# Patient Record
Sex: Male | Born: 1950
Health system: Southern US, Community
[De-identification: ages and names within clinical notes are randomized; demographics above are authoritative.]

## PROBLEM LIST (undated history)

## (undated) DIAGNOSIS — T7840XA Allergy, unspecified, initial encounter: Secondary | ICD-10-CM

## (undated) DIAGNOSIS — E119 Type 2 diabetes mellitus without complications: Secondary | ICD-10-CM

## (undated) DIAGNOSIS — N400 Enlarged prostate without lower urinary tract symptoms: Secondary | ICD-10-CM

## (undated) DIAGNOSIS — I2510 Atherosclerotic heart disease of native coronary artery without angina pectoris: Secondary | ICD-10-CM

## (undated) DIAGNOSIS — G4700 Insomnia, unspecified: Secondary | ICD-10-CM

## (undated) DIAGNOSIS — K635 Polyp of colon: Secondary | ICD-10-CM

## (undated) DIAGNOSIS — K219 Gastro-esophageal reflux disease without esophagitis: Secondary | ICD-10-CM

## (undated) DIAGNOSIS — M4800 Spinal stenosis, site unspecified: Secondary | ICD-10-CM

## (undated) DIAGNOSIS — E785 Hyperlipidemia, unspecified: Secondary | ICD-10-CM

## (undated) DIAGNOSIS — I10 Essential (primary) hypertension: Secondary | ICD-10-CM

## (undated) DIAGNOSIS — Z970 Presence of artificial eye: Secondary | ICD-10-CM

## (undated) HISTORY — DX: Type 2 diabetes mellitus without complications: E11.9

## (undated) HISTORY — DX: Insomnia, unspecified: G47.00

## (undated) HISTORY — DX: Hyperlipidemia, unspecified: E78.5

## (undated) HISTORY — DX: Polyp of colon: K63.5

## (undated) HISTORY — DX: Benign prostatic hyperplasia without lower urinary tract symptoms: N40.0

## (undated) HISTORY — DX: Gastro-esophageal reflux disease without esophagitis: K21.9

## (undated) HISTORY — DX: Atherosclerotic heart disease of native coronary artery without angina pectoris: I25.10

## (undated) HISTORY — DX: Essential (primary) hypertension: I10

## (undated) HISTORY — PX: OTHER SURGICAL HISTORY: SHX169

## (undated) HISTORY — DX: Allergy, unspecified, initial encounter: T78.40XA

## (undated) HISTORY — DX: Presence of artificial eye: Z97.0

## (undated) HISTORY — DX: Spinal stenosis, site unspecified: M48.00

## (undated) HISTORY — PX: JOINT REPLACEMENT: SHX530

---

## 1996-09-14 HISTORY — PX: CORONARY ARTERY BYPASS GRAFT: SHX141

## 2004-09-14 HISTORY — PX: CORONARY STENT PLACEMENT: SHX1402

## 2005-06-19 ENCOUNTER — Ambulatory Visit: Payer: Self-pay | Admitting: Cardiology

## 2005-06-19 ENCOUNTER — Other Ambulatory Visit: Payer: Self-pay

## 2005-06-20 ENCOUNTER — Other Ambulatory Visit: Payer: Self-pay

## 2005-07-14 ENCOUNTER — Encounter: Payer: Self-pay | Admitting: Cardiology

## 2008-01-20 ENCOUNTER — Ambulatory Visit: Payer: Self-pay | Admitting: General Surgery

## 2008-02-01 ENCOUNTER — Ambulatory Visit: Payer: Self-pay | Admitting: General Surgery

## 2010-06-26 ENCOUNTER — Ambulatory Visit: Payer: Self-pay | Admitting: Cardiology

## 2012-04-12 DIAGNOSIS — K635 Polyp of colon: Secondary | ICD-10-CM

## 2012-04-12 DIAGNOSIS — K219 Gastro-esophageal reflux disease without esophagitis: Secondary | ICD-10-CM

## 2012-04-12 DIAGNOSIS — I2510 Atherosclerotic heart disease of native coronary artery without angina pectoris: Secondary | ICD-10-CM | POA: Insufficient documentation

## 2012-04-12 DIAGNOSIS — M4800 Spinal stenosis, site unspecified: Secondary | ICD-10-CM

## 2012-04-12 DIAGNOSIS — Z970 Presence of artificial eye: Secondary | ICD-10-CM | POA: Insufficient documentation

## 2012-04-12 HISTORY — DX: Gastro-esophageal reflux disease without esophagitis: K21.9

## 2012-04-12 HISTORY — DX: Atherosclerotic heart disease of native coronary artery without angina pectoris: I25.10

## 2012-04-12 HISTORY — DX: Presence of artificial eye: Z97.0

## 2012-04-12 HISTORY — DX: Spinal stenosis, site unspecified: M48.00

## 2012-04-12 HISTORY — DX: Polyp of colon: K63.5

## 2012-11-26 ENCOUNTER — Encounter: Payer: Self-pay | Admitting: General Surgery

## 2013-02-07 ENCOUNTER — Ambulatory Visit: Payer: Self-pay | Admitting: General Surgery

## 2013-03-21 ENCOUNTER — Ambulatory Visit: Payer: Self-pay | Admitting: General Surgery

## 2013-04-27 ENCOUNTER — Encounter: Payer: Self-pay | Admitting: *Deleted

## 2014-03-05 DIAGNOSIS — G4700 Insomnia, unspecified: Secondary | ICD-10-CM | POA: Insufficient documentation

## 2014-03-05 DIAGNOSIS — E119 Type 2 diabetes mellitus without complications: Secondary | ICD-10-CM

## 2014-03-05 DIAGNOSIS — N400 Enlarged prostate without lower urinary tract symptoms: Secondary | ICD-10-CM | POA: Insufficient documentation

## 2014-03-05 HISTORY — DX: Insomnia, unspecified: G47.00

## 2014-03-05 HISTORY — DX: Type 2 diabetes mellitus without complications: E11.9

## 2014-03-05 HISTORY — DX: Benign prostatic hyperplasia without lower urinary tract symptoms: N40.0

## 2014-03-27 ENCOUNTER — Ambulatory Visit: Payer: Self-pay | Admitting: Urology

## 2014-04-09 ENCOUNTER — Ambulatory Visit: Payer: Self-pay | Admitting: Urology

## 2014-05-29 ENCOUNTER — Ambulatory Visit: Payer: Self-pay | Admitting: Specialist

## 2014-05-29 DIAGNOSIS — I25810 Atherosclerosis of coronary artery bypass graft(s) without angina pectoris: Secondary | ICD-10-CM

## 2014-05-29 LAB — BASIC METABOLIC PANEL
Anion Gap: 6 — ABNORMAL LOW (ref 7–16)
BUN: 13 mg/dL (ref 7–18)
Calcium, Total: 8.7 mg/dL (ref 8.5–10.1)
Chloride: 109 mmol/L — ABNORMAL HIGH (ref 98–107)
Co2: 24 mmol/L (ref 21–32)
Creatinine: 0.85 mg/dL (ref 0.60–1.30)
EGFR (African American): >60
EGFR (Non-African Amer.): >60
Glucose: 147 mg/dL — ABNORMAL HIGH (ref 65–99)
Osmolality: 280 (ref 275–301)
Potassium: 4.4 mmol/L (ref 3.5–5.1)
Sodium: 139 mmol/L (ref 136–145)

## 2014-05-29 LAB — CBC WITH DIFFERENTIAL/PLATELET
Basophil #: 0 10*3/uL (ref 0.0–0.1)
Basophil %: 0.5 %
Eosinophil #: 0.2 10*3/uL (ref 0.0–0.7)
Eosinophil %: 3.5 %
HCT: 41.4 % (ref 40.0–52.0)
HGB: 13.8 g/dL (ref 13.0–18.0)
Lymphocyte #: 1.4 10*3/uL (ref 1.0–3.6)
Lymphocyte %: 27.6 %
MCH: 32.1 pg (ref 26.0–34.0)
MCHC: 33.4 g/dL (ref 32.0–36.0)
MCV: 96 fL (ref 80–100)
Monocyte #: 0.4 x10 3/mm (ref 0.2–1.0)
Monocyte %: 7.1 %
Neutrophil #: 3.1 10*3/uL (ref 1.4–6.5)
Neutrophil %: 61.3 %
Platelet: 183 10*3/uL (ref 150–440)
RBC: 4.3 10*6/uL — ABNORMAL LOW (ref 4.40–5.90)
RDW: 13.8 % (ref 11.5–14.5)
WBC: 5.1 10*3/uL (ref 3.8–10.6)

## 2014-06-01 ENCOUNTER — Ambulatory Visit: Payer: Self-pay | Admitting: Specialist

## 2014-06-05 LAB — PATHOLOGY REPORT

## 2014-07-06 ENCOUNTER — Ambulatory Visit: Payer: Self-pay | Admitting: Urology

## 2014-07-08 ENCOUNTER — Emergency Department: Payer: Self-pay | Admitting: Emergency Medicine

## 2014-07-08 LAB — URINALYSIS, COMPLETE
Bacteria: NONE SEEN
Bilirubin,UR: NEGATIVE
Blood: NEGATIVE
Glucose,UR: 50 mg/dL (ref 0–75)
Leukocyte Esterase: NEGATIVE
Nitrite: NEGATIVE
Ph: 5 (ref 4.5–8.0)
Protein: NEGATIVE
RBC,UR: 1 /HPF (ref 0–5)
Specific Gravity: 1.019 (ref 1.003–1.030)
Squamous Epithelial: NONE SEEN
WBC UR: 1 /HPF (ref 0–5)

## 2014-08-03 ENCOUNTER — Ambulatory Visit: Payer: Self-pay | Admitting: Urology

## 2014-08-16 ENCOUNTER — Ambulatory Visit: Payer: Self-pay | Admitting: Urology

## 2014-08-16 LAB — BASIC METABOLIC PANEL
Anion Gap: 9 (ref 7–16)
BUN: 21 mg/dL — ABNORMAL HIGH (ref 7–18)
Calcium, Total: 8.3 mg/dL — ABNORMAL LOW (ref 8.5–10.1)
Chloride: 108 mmol/L — ABNORMAL HIGH (ref 98–107)
Co2: 24 mmol/L (ref 21–32)
Creatinine: 0.96 mg/dL (ref 0.60–1.30)
EGFR (African American): >60
EGFR (Non-African Amer.): >60
Glucose: 118 mg/dL — ABNORMAL HIGH (ref 65–99)
Osmolality: 285 (ref 275–301)
Potassium: 4.9 mmol/L (ref 3.5–5.1)
Sodium: 141 mmol/L (ref 136–145)

## 2014-08-16 LAB — CBC
HCT: 41.6 % (ref 40.0–52.0)
HGB: 13.9 g/dL (ref 13.0–18.0)
MCH: 31.7 pg (ref 26.0–34.0)
MCHC: 33.3 g/dL (ref 32.0–36.0)
MCV: 95 fL (ref 80–100)
Platelet: 166 10*3/uL (ref 150–440)
RBC: 4.38 10*6/uL — ABNORMAL LOW (ref 4.40–5.90)
RDW: 13.4 % (ref 11.5–14.5)
WBC: 4.3 10*3/uL (ref 3.8–10.6)

## 2014-08-16 LAB — PROTIME-INR
INR: 1
Prothrombin Time: 13.2 secs (ref 11.5–14.7)

## 2014-08-16 LAB — APTT: Activated PTT: 24.7 secs (ref 23.6–35.9)

## 2014-08-23 ENCOUNTER — Ambulatory Visit: Payer: Self-pay | Admitting: Urology

## 2014-09-20 ENCOUNTER — Ambulatory Visit: Payer: Self-pay | Admitting: Urology

## 2014-09-20 LAB — CREATININE, SERUM
Creatinine: 1.1 mg/dL (ref 0.60–1.30)
EGFR (African American): >60
EGFR (Non-African Amer.): >60

## 2014-10-18 ENCOUNTER — Ambulatory Visit: Payer: Self-pay

## 2014-11-16 ENCOUNTER — Ambulatory Visit: Payer: Self-pay

## 2015-01-05 NOTE — Op Note (Signed)
PATIENT NAME:  [name], [name] MR#:  [id number] DATE OF BIRTH:  1950/10/22  DATE OF PROCEDURE:  06/01/2014  PREOPERATIVE DIAGNOSIS: Chronic olecranon bursitis, left elbow.   POSTOPERATIVE DIAGNOSIS: Chronic olecranon bursitis, left elbow.   PROCEDURE: Excision of olecranon bursa, left elbow.   SURGEON: Christophe Louis, M.D.   ANESTHESIA: General.   COMPLICATIONS: None.   TOURNIQUET TIME: Approximately 50 minutes.   DESCRIPTION OF PROCEDURE: One gram of Ancef was given intravenously prior to the procedure. General anesthesia is induced. The left upper extremity is thoroughly prepped with alcohol and ChloraPrep and draped in standard sterile fashion. The extremity is wrapped out with the Esmarch bandage and a sterile tourniquet is elevated to 250 mmHg. Under loupe magnification, curved lateral incision is made at the left elbow olecranon bursa. The skin is undermined and the bursa sac is carefully dissected out. Special attention is paid to the position of the ulnar nerve medially. The sac is sent to pathology. The rongeur is then used under loupe magnification to carefully remove any remnants of bursa sac. The wound is thoroughly irrigated multiple times. Skin edges are infiltrated with 0.5% plain Marcaine. Two vessel loop drains are brought out through separate stab wound incisions. The skin is closed with the skin stapler. A soft bulky dressing is applied. The tourniquet is released. The patient is returned to the recovery room in satisfactory condition having tolerated the procedure quite well. ____________________________ Lucas Mallow, MD ces:sb D: 06/01/2014 10:29:55 ET T: 06/01/2014 10:57:46 ET JOB#: [id number]  cc: Lucas Mallow, MD, <Dictator> Lucas Mallow MD ELECTRONICALLY SIGNED 06/08/2014 10:04

## 2015-01-05 NOTE — Op Note (Signed)
PATIENT NAME:  [name], MAISANO MR#:  [id number] DATE OF BIRTH:  [birth year]  DATE OF PROCEDURE:  08/23/2014  PREOPERATIVE DIAGNOSIS: Benign prostatic hypertrophy.   POSTOPERATIVE DIAGNOSIS: Benign prostatic hypertrophy.  PROCEDURE PERFORMED:  Holmium laser nucleation of the prostate with morcellation.  ATTENDING SURGEON: Sherlynn Stalls, MD  ANESTHESIA: General anesthesia.   ESTIMATED BLOOD LOSS: 25 mL.   DRAINS: A 20-French 2-way Foley catheter with 30 mL in the balloon.   SPECIMENS: Prostate tissue.   COMPLICATIONS: None.   INDICATION: This is a 64 year old male with significant obstructive symptoms, found to have an enlarged prostate on cystoscopy and truss, and elevated bladder neck and trabeculation. He was counseled on his various treatment options and has elected to undergo holmium laser enucleation of the prostate with morcellation. Risks and benefits of the procedure were explained in detail. The patient agreed to proceed as planned.   PROCEDURE: The patient was correctly identified in the preoperative holding area and informed consent was confirmed. He was brought to the operating suite and placed on the table in supine position. At this time, universal timeout protocol was performed. All team members were identified. Venodyne boots were placed, and he was administered 500 mg of IV Levaquin in the perioperative period. He was then placed under general anesthesia, intubated and repositioned lower on the bed in the dorsal lithotomy position, and prepped and draped in standard surgical fashion. At this point in time, a 26-French resectoscope was advanced per urethra into the bladder, and special attention was made to the prostatic fossa. There is an elevated bladder neck noted with no true median lobe.  The trigone was relatively and far away from the bladder neck. At this point in time, using a 550 micron laser fiber, an incision was made using 2 joules and 50 Hz at the 6 o'clock  position, just proximal to the veru. This trough was deepened to allow more irrigation flow and further definition of the lateral lobe. At this point in time, attention was turned to the right lateral lobe, which was enucleated, starting at the apex of the prostate, which was carefully and sequentially dissected free from the capsule from a cranial to caudal direction until the entire lobe hung  just from the bladder neck by a mucosal bridge. This was then incised and the lobe was freed up and pushed into the bladder. The same exact procedure was used on the right lateral lobe, freeing it and pushing it into the bladder. A morcellator was then brought in and these pieces were carefully morcellated, with care taken to avoid any injury to the bladder. One small remaining, fragment was brought out using graspers, at which time, the scope was removed. Hemostasis was excellent during the entire procedure. A 20-French Foley catheter was then placed over a catheter guide into the bladder and the bladder was drained. The balloon was inflated with 30 mL of sterile water. The patient was repositioned in the supine position, reversed from anesthesia, and taken to the PACU in stable condition. There were no complications in this case.   ____________________________ Sherlynn Stalls, MD ajb:mw D: 08/23/2014 16:14:49 ET T: 08/23/2014 16:57:06 ET JOB#: [id number]  cc: Sherlynn Stalls, MD, <Dictator> Sherlynn Stalls MD ELECTRONICALLY SIGNED 08/29/2014 11:00

## 2015-01-07 LAB — SURGICAL PATHOLOGY

## 2015-03-08 ENCOUNTER — Other Ambulatory Visit
Admission: RE | Admit: 2015-03-08 | Discharge: 2015-03-08 | Disposition: A | Discharge status: Home / Self Care | Payer: Private Health Insurance - Indemnity | Source: Ambulatory Visit | Attending: Specialist | Admitting: Specialist

## 2015-03-08 DIAGNOSIS — M25461 Effusion, right knee: Secondary | ICD-10-CM | POA: Diagnosis not present

## 2015-03-11 LAB — WOUND CULTURE

## 2015-03-12 LAB — ANAEROBIC CULTURE

## 2015-04-24 ENCOUNTER — Other Ambulatory Visit: Payer: Self-pay

## 2015-04-24 DIAGNOSIS — N200 Calculus of kidney: Secondary | ICD-10-CM

## 2015-11-15 ENCOUNTER — Ambulatory Visit: Payer: Self-pay | Admitting: Urology

## 2015-11-19 ENCOUNTER — Ambulatory Visit
Admission: RE | Admit: 2015-11-19 | Discharge: 2015-11-19 | Disposition: A | Discharge status: Home / Self Care | Payer: Managed Care, Other (non HMO) | Source: Ambulatory Visit | Attending: Urology | Admitting: Urology

## 2015-11-19 ENCOUNTER — Ambulatory Visit (INDEPENDENT_AMBULATORY_CARE_PROVIDER_SITE_OTHER): Payer: Medicare Other | Admitting: Urology

## 2015-11-19 ENCOUNTER — Encounter: Payer: Self-pay | Admitting: Urology

## 2015-11-19 VITALS — BP 162/74 | HR 83 | Ht 71.0 in | Wt 180.8 lb

## 2015-11-19 DIAGNOSIS — N200 Calculus of kidney: Secondary | ICD-10-CM

## 2015-11-19 DIAGNOSIS — K5641 Fecal impaction: Secondary | ICD-10-CM | POA: Insufficient documentation

## 2015-11-19 DIAGNOSIS — E785 Hyperlipidemia, unspecified: Secondary | ICD-10-CM

## 2015-11-19 DIAGNOSIS — Z125 Encounter for screening for malignant neoplasm of prostate: Secondary | ICD-10-CM

## 2015-11-19 DIAGNOSIS — N401 Enlarged prostate with lower urinary tract symptoms: Secondary | ICD-10-CM

## 2015-11-19 DIAGNOSIS — N138 Other obstructive and reflux uropathy: Secondary | ICD-10-CM

## 2015-11-19 DIAGNOSIS — I10 Essential (primary) hypertension: Secondary | ICD-10-CM | POA: Insufficient documentation

## 2015-11-19 HISTORY — DX: Hyperlipidemia, unspecified: E78.5

## 2015-11-19 HISTORY — DX: Essential (primary) hypertension: I10

## 2015-11-19 NOTE — Progress Notes (Signed)
11/19/2015 9:06 PM   [name] 04-09-1951 [id number]  Referring provider: No referring provider defined for this encounter.  Chief Complaint  Patient presents with  . Nephrolithiasis    1year with KUB    HPI: 65 year old male with a history of BPH status post holep, history of kidney stones returned today for routine annual follow-up.  BPH with BOO S/p HoLEP 12/15- path benign Very pleased with voiding symptoms. Excellent stream. No urgency or frequency. No incontinence other than occasional trickle post void. Nocturia x 1.  Good bladder emptying. Currently taking not any BPH meds.  History of kidney stones S/p ESWL 09/2014 for 9 mm RLP stone KUB today stable from 1 year ago.   No flank pain or gross hematuria.  No passed stones for >1 year. Stone analysis CaOx Trying to drink more water, watch salt intake   PMH: Past Medical History  Diagnosis Date  . Arteriosclerosis of coronary artery 04/12/2012    Overview:  1.  Atherosclerotic coronary artery disease.    a.  normal LV function.  Coronary arteriography 5/98 revealed 50% LAD lesion, 95% lesion in large diagonal branch. Left circumflex was normal.  25% proximal RCA lesion.  50% lesion in the PDA.    b.  PTCA of the first diagonal and placement of three multi-link stents.    c.  recent ETT Cardiolite revealed low probability for ischemia.     d.  cardiac catheterization on 05/18/97 per [name] revealed 80% LAD, 70% proximal, 90% mid first AL, 30% RCA.     e.  status post coronary bypass grafting x 2 per [name].    f.  recurrent chest pain with exertion, equivocal ETT.    g.  repeat cardiac catheterization revealed an occluded vein graft.     h.  medical therapy.    i.  ETT Myoview showed borderline changes.     j.  cardiac catheterization 8/02 revealed no significant change in anatomy from previous cath.     k. cardiac cath 06/19/05 revealed patent left internal mammary artery to occluded LAD. Saphenous vein to D1  was chronically occ  . Benign essential HTN 11/19/2015  . Acid reflux 04/12/2012  . Colon polyp 04/12/2012  . Type 2 diabetes mellitus ([place]) 03/05/2014  . Benign fibroma of prostate 03/05/2014  . Cannot sleep 03/05/2014  . HLD (hyperlipidemia) 11/19/2015  . Presence of artificial eye 04/12/2012  . Spinal stenosis 04/12/2012    Surgical History: Past Surgical History  Procedure Laterality Date  . Coronary artery bypass graft  1998  . Coronary stent placement  2006    Home Medications:    Medication List       This list is accurate as of: 11/19/15  9:06 PM.  Always use your most recent med list.               aspirin EC 325 MG tablet  Take by mouth.     atenolol 50 MG tablet  Commonly known as:  TENORMIN  Take by mouth.     clopidogrel 75 MG tablet  Commonly known as:  PLAVIX  Take by mouth.     CRESTOR 40 MG tablet  Generic drug:  rosuvastatin  Take by mouth.     eszopiclone 2 MG Tabs tablet  Commonly known as:  LUNESTA  Take 2 mg by mouth at bedtime as needed for sleep. Take immediately before bedtime     glimepiride 4 MG tablet  Commonly known as:  AMARYL  Take by mouth.     lansoprazole 30 MG capsule  Commonly known as:  PREVACID     lisinopril 5 MG tablet  Commonly known as:  PRINIVIL,ZESTRIL  Take 5 mg by mouth daily.     metFORMIN 500 MG tablet  Commonly known as:  GLUCOPHAGE     TRADJENTA 5 MG Tabs tablet  Generic drug:  linagliptin  Take by mouth.        Allergies:  Allergies  Allergen Reactions  . Penicillins Rash    Childhood. Did not require medical care.    Family History: Family History  Problem Relation Age of Onset  . Prostate cancer Neg Hx   . Bladder Cancer Neg Hx   . Kidney cancer Neg Hx   . Nephrolithiasis Brother     Social History:  reports that he has quit smoking. He does not have any smokeless tobacco history on file. He reports that he drinks alcohol. He reports that he does not use illicit  drugs.  ROS: UROLOGY Frequent Urination?: No Hard to postpone urination?: No Burning/pain with urination?: No Get up at night to urinate?: No Leakage of urine?: Yes Urine stream starts and stops?: No Trouble starting stream?: No Do you have to strain to urinate?: No Blood in urine?: No Urinary tract infection?: No Sexually transmitted disease?: No Injury to kidneys or bladder?: No Painful intercourse?: No Weak stream?: No Erection problems?: No Penile pain?: No  Gastrointestinal Nausea?: No Vomiting?: No Indigestion/heartburn?: No Diarrhea?: No Constipation?: No  Constitutional Fever: No Night sweats?: No Weight loss?: No Fatigue?: No  Skin Skin rash/lesions?: No Itching?: No  Eyes Blurred vision?: No Double vision?: No  Ears/Nose/Throat Sore throat?: No Sinus problems?: No  Hematologic/Lymphatic Swollen glands?: No Easy bruising?: No  Cardiovascular Leg swelling?: No Chest pain?: No  Respiratory Cough?: No Shortness of breath?: No  Endocrine Excessive thirst?: No  Musculoskeletal Back pain?: No Joint pain?: No  Neurological Headaches?: No Dizziness?: No  Psychologic Depression?: No Anxiety?: No  Physical Exam: BP 162/74 mmHg  Pulse 83  Ht 5\' 11"  (1.803 m)  Wt 180 lb 12.8 oz (82.01 kg)  BMI 25.23 kg/m2  Constitutional:  Alert and oriented, No acute distress. HEENT: [place] AT, moist mucus membranes.  Trachea midline, no masses. Cardiovascular: No clubbing, cyanosis, or edema. Respiratory: Normal respiratory effort, no increased work of breathing. GI: Abdomen is soft, nontender, nondistended, no abdominal masses GU: No CVA tenderness. External hemriouds.  Normal sphincter tone.  50 cc prostate, FIRM diffusely, no nodules.   Skin: No rashes, bruises or suspicious lesions.. Neurologic: Grossly intact, no focal deficits, moving all 4 extremities. Psychiatric: Normal mood and affect.  Laboratory Data: Lab Results  Component Value Date    WBC 4.3 08/16/2014   HGB 13.9 08/16/2014   HCT 41.6 08/16/2014   MCV 95 08/16/2014   PLT 166 08/16/2014    Lab Results  Component Value Date   CREATININE 1.10 09/20/2014     Urinalysis Reviewed, see EPIC  Pertinent Imaging: Study Result     CLINICAL DATA: History of kidney stones, follow-up  EXAM: ABDOMEN - 1 VIEW  COMPARISON: 11/16/2014  FINDINGS: There is normal small bowel gas pattern. Study is suboptimal due to colonic stool obscuring the kidneys. No definite renal or ureteral calcifications are noted. Postcholecystectomy surgical clips are noted.  IMPRESSION: Suboptimal study due to stool within colon. No definite renal or ureteral calcifications are identified.   Electronically Signed  By: Lahoma Crocker M.D.  On:  11/19/2015 16:51   KUB reviewed personally today, appreciate stable punctate right renal stone   Assessment & Plan:    1. Nephrolithiasis KUB stable today, punctate left renal stone without change over course of 1 year No further stone episodes Continued to encourage hydration - Urinalysis, Complete  2. BPH (benign prostatic hypertrophy) with urinary obstruction S/p HoLEP Overall very pleased with voiding  3. Screening PSA (prostate specific antigen) PSA/ DRE today  - PSA  F/U prn (PCP to continue annual PSA screening).  Will call with PSA results.   Hollice Espy, MD  [facility] [address] [place], [place] [address] [phone]

## 2015-11-20 ENCOUNTER — Telehealth: Payer: Self-pay

## 2015-11-20 LAB — MICROSCOPIC EXAMINATION
Bacteria, UA: NONE SEEN
Epithelial Cells (non renal): NONE SEEN /hpf (ref 0–10)
RBC, UA: NONE SEEN /hpf (ref 0–?)
WBC, UA: NONE SEEN /hpf (ref 0–?)

## 2015-11-20 LAB — URINALYSIS, COMPLETE
Bilirubin, UA: NEGATIVE
Ketones, UA: NEGATIVE
Leukocytes, UA: NEGATIVE
Nitrite, UA: NEGATIVE
Protein, UA: NEGATIVE
RBC, UA: NEGATIVE
Specific Gravity, UA: 1.02 (ref 1.005–1.030)
Urobilinogen, Ur: 1 mg/dL (ref 0.2–1.0)
pH, UA: 6.5 (ref 5.0–7.5)

## 2015-11-20 LAB — PSA: Prostate Specific Ag, Serum: 0.5 ng/mL (ref 0.0–4.0)

## 2015-11-20 NOTE — Telephone Encounter (Signed)
-----   Message from Hollice Espy, MD sent at 11/20/2015  9:32 AM EST ----- Please let this patient know PSA is stable.    Hollice Espy, MD

## 2015-11-20 NOTE — Telephone Encounter (Signed)
Spoke with pt made aware of PSA results. Pt voiced understanding.

## 2017-03-04 IMAGING — CR DG ABDOMEN 1V
1 series · 2 of 2 positions shown · non-contrast
Comparison: 11/16/2014

CLINICAL DATA: History of kidney stones, follow-up

EXAM:
ABDOMEN - 1 VIEW

[Series 1: dg abd 1 view · 0.14mm/px · 2 of 2 slices shown]
[im 1/2]
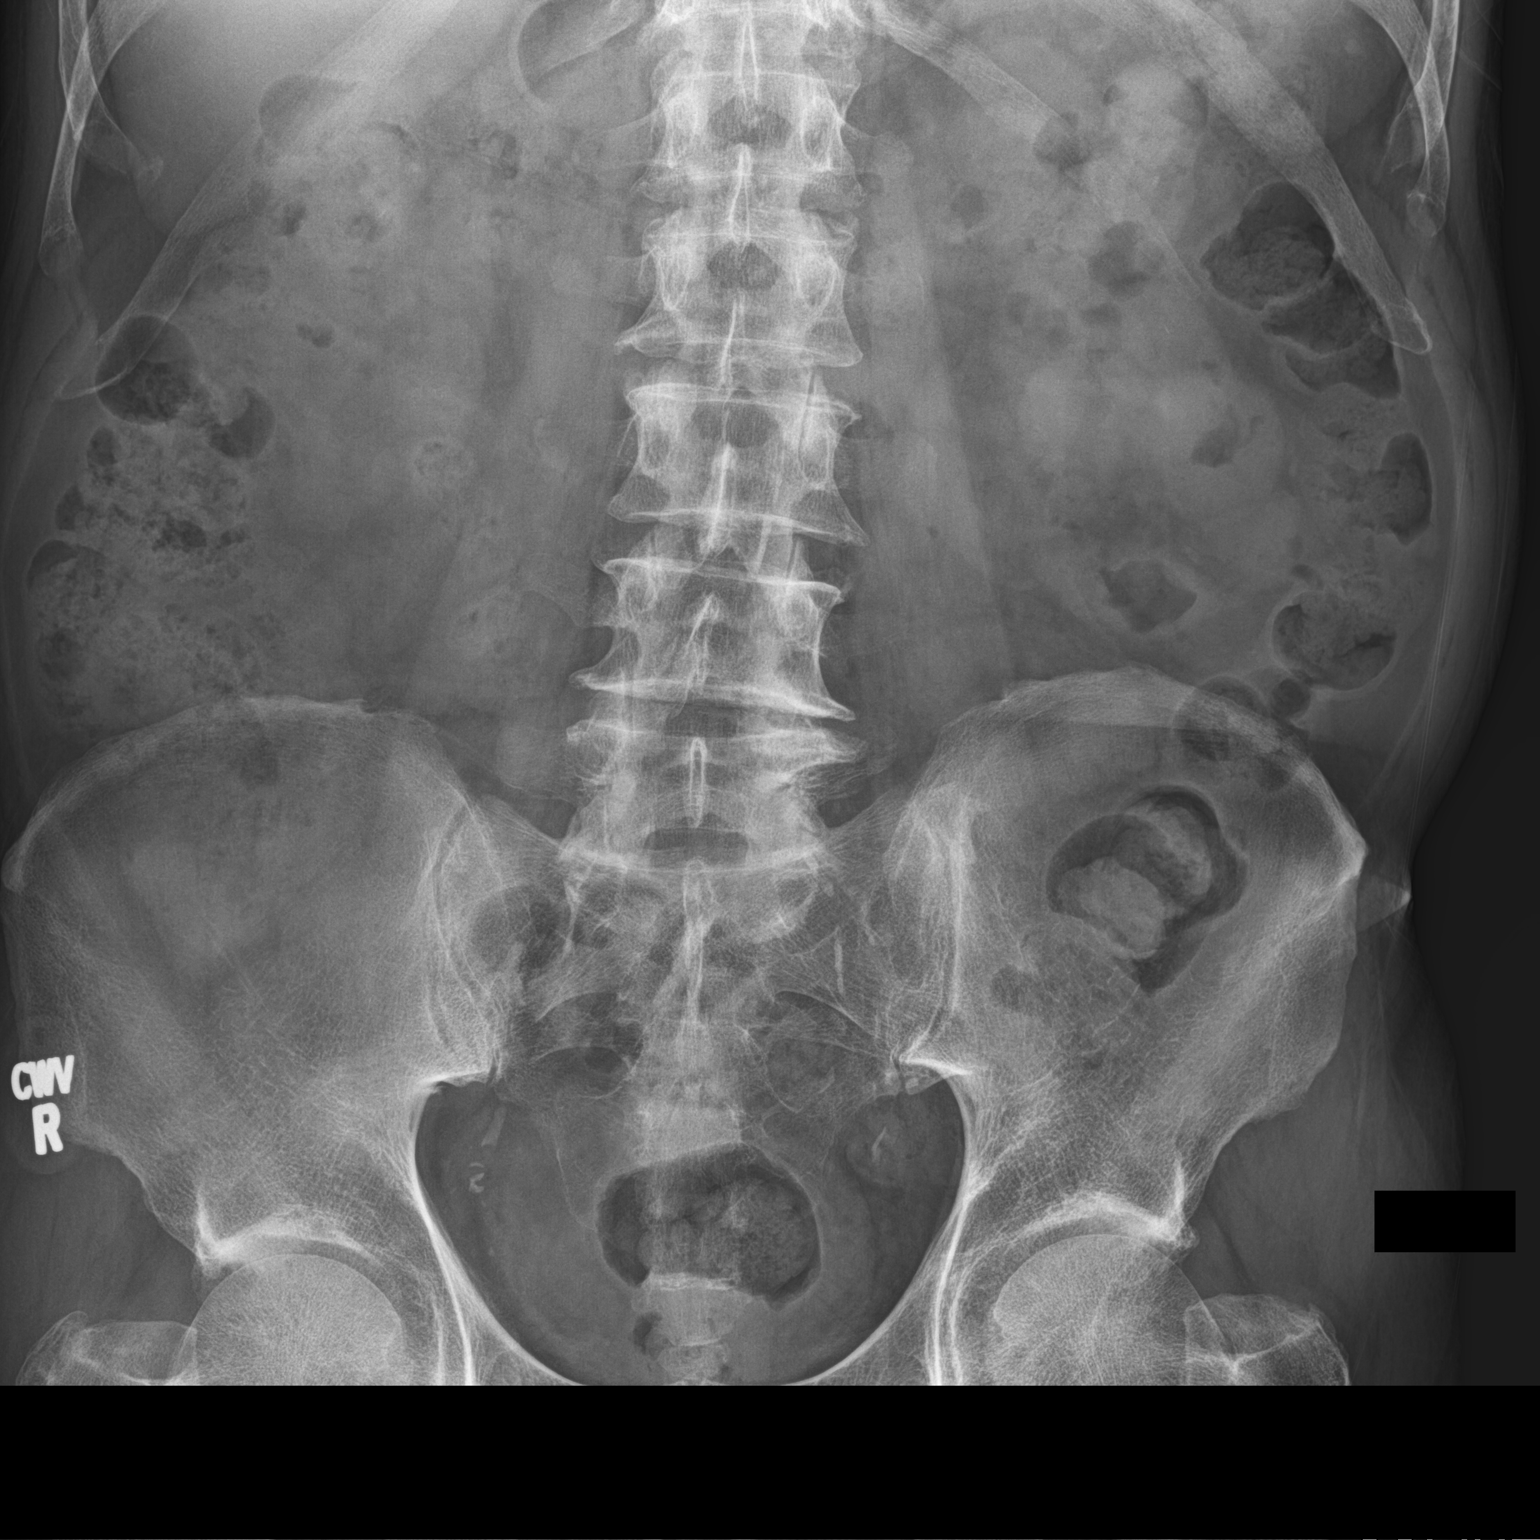
[im 2/2]
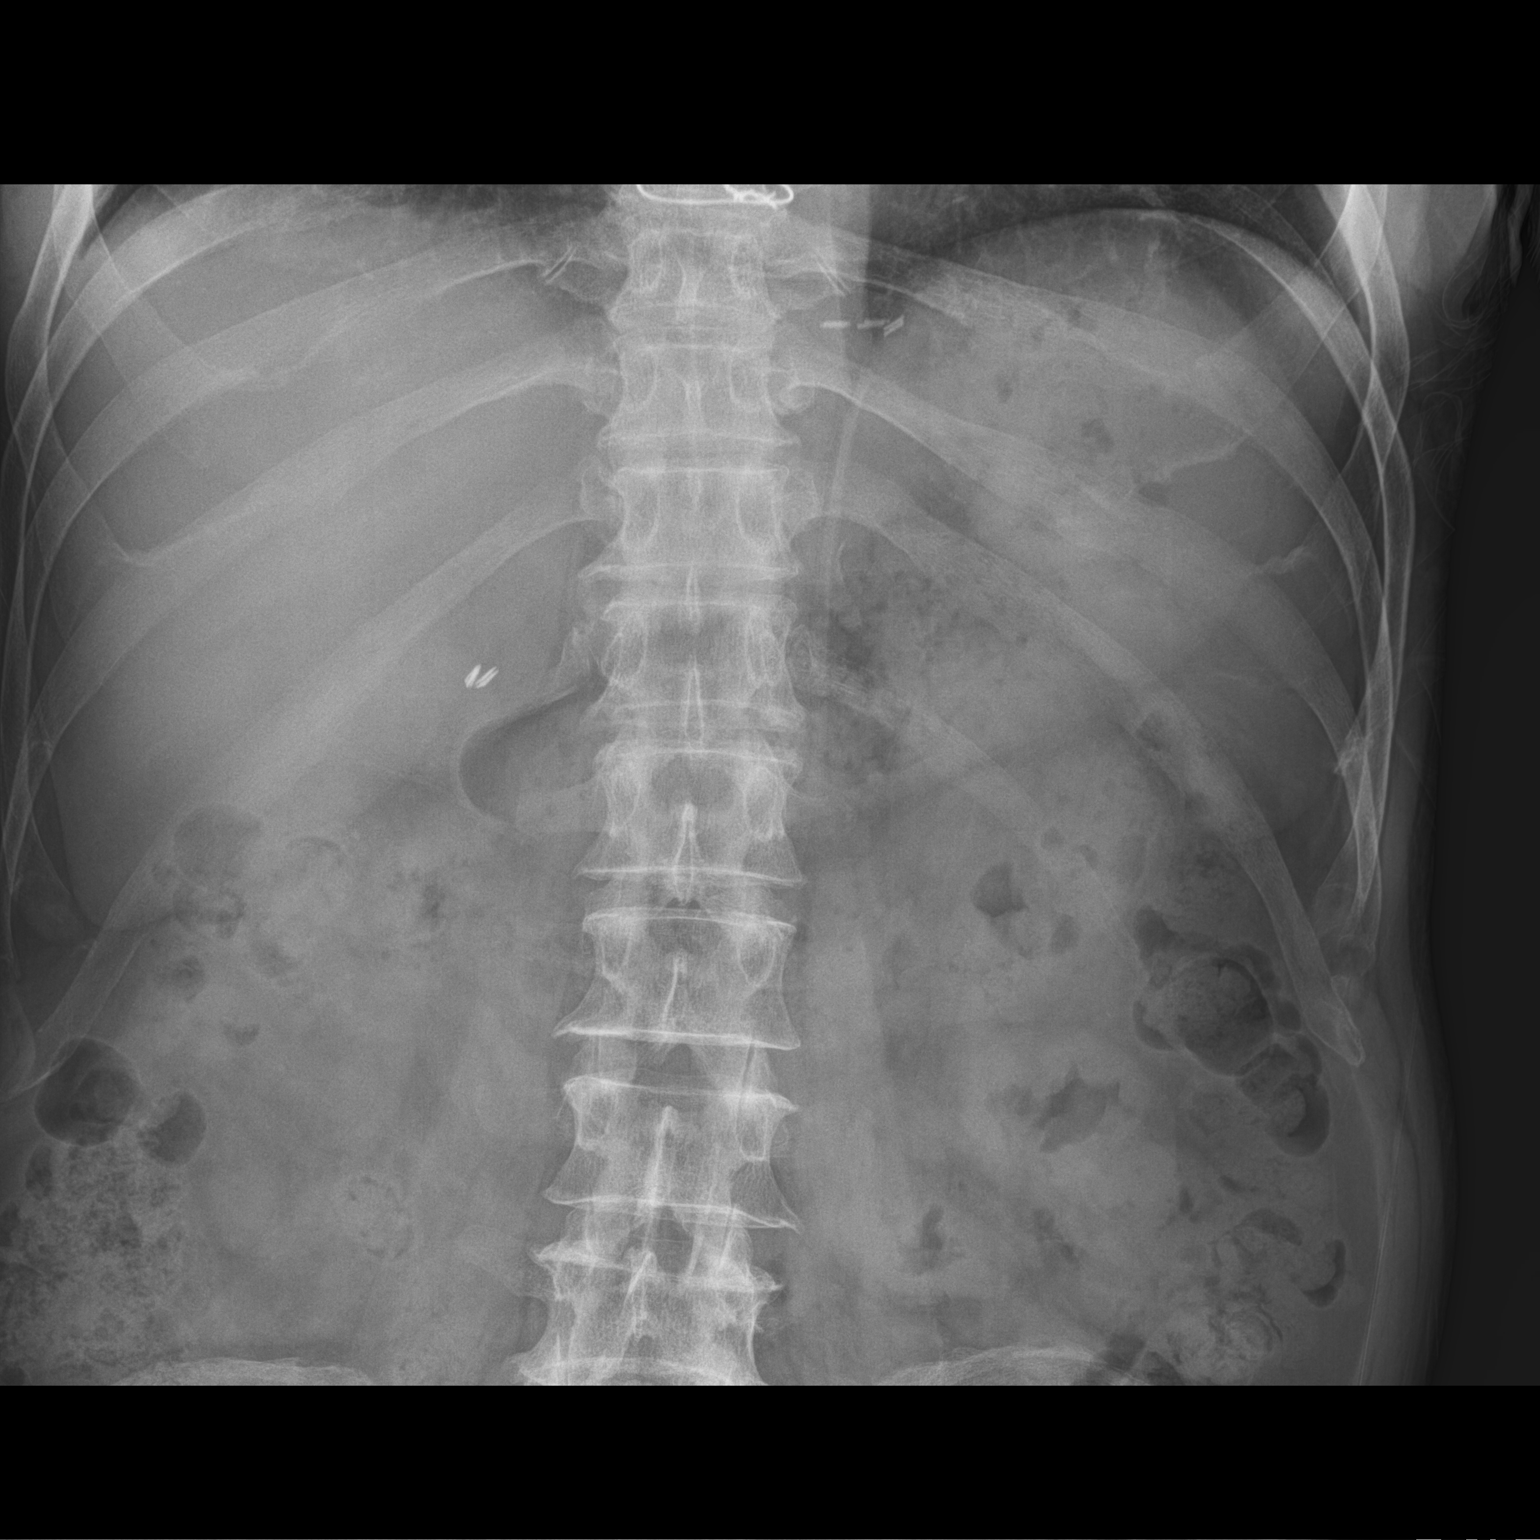

[2 of 2 positions shown; findings below may reference images not displayed]

FINDINGS: There is normal small bowel gas pattern. Study is suboptimal due to
colonic stool obscuring the kidneys. No definite renal or ureteral
calcifications are noted. Postcholecystectomy surgical clips are
noted.
IMPRESSION: Suboptimal study due to stool within colon. No definite renal or
ureteral calcifications are identified.

## 2017-12-10 ENCOUNTER — Encounter: Payer: Self-pay | Admitting: *Deleted

## 2017-12-13 ENCOUNTER — Ambulatory Visit: Payer: BLUE CROSS/BLUE SHIELD | Admitting: Certified Registered Nurse Anesthetist

## 2017-12-13 ENCOUNTER — Ambulatory Visit
Admission: RE | Admit: 2017-12-13 | Discharge: 2017-12-13 | Disposition: A | Discharge status: Home / Self Care | Payer: BLUE CROSS/BLUE SHIELD | Source: Ambulatory Visit | Attending: Gastroenterology | Admitting: Gastroenterology

## 2017-12-13 ENCOUNTER — Encounter: Payer: Self-pay | Admitting: Anesthesiology

## 2017-12-13 ENCOUNTER — Encounter
Admission: RE | Disposition: A | Discharge status: Home / Self Care | Payer: Self-pay | Source: Ambulatory Visit | Attending: Gastroenterology

## 2017-12-13 DIAGNOSIS — Z79899 Other long term (current) drug therapy: Secondary | ICD-10-CM | POA: Diagnosis not present

## 2017-12-13 DIAGNOSIS — Z87891 Personal history of nicotine dependence: Secondary | ICD-10-CM | POA: Diagnosis not present

## 2017-12-13 DIAGNOSIS — Z1211 Encounter for screening for malignant neoplasm of colon: Secondary | ICD-10-CM | POA: Diagnosis not present

## 2017-12-13 DIAGNOSIS — K5730 Diverticulosis of large intestine without perforation or abscess without bleeding: Secondary | ICD-10-CM | POA: Diagnosis not present

## 2017-12-13 DIAGNOSIS — I10 Essential (primary) hypertension: Secondary | ICD-10-CM | POA: Insufficient documentation

## 2017-12-13 DIAGNOSIS — D124 Benign neoplasm of descending colon: Secondary | ICD-10-CM | POA: Diagnosis not present

## 2017-12-13 DIAGNOSIS — E119 Type 2 diabetes mellitus without complications: Secondary | ICD-10-CM | POA: Diagnosis not present

## 2017-12-13 DIAGNOSIS — E785 Hyperlipidemia, unspecified: Secondary | ICD-10-CM | POA: Diagnosis not present

## 2017-12-13 DIAGNOSIS — Z7982 Long term (current) use of aspirin: Secondary | ICD-10-CM | POA: Insufficient documentation

## 2017-12-13 DIAGNOSIS — K219 Gastro-esophageal reflux disease without esophagitis: Secondary | ICD-10-CM | POA: Insufficient documentation

## 2017-12-13 DIAGNOSIS — I2510 Atherosclerotic heart disease of native coronary artery without angina pectoris: Secondary | ICD-10-CM | POA: Insufficient documentation

## 2017-12-13 DIAGNOSIS — Z7984 Long term (current) use of oral hypoglycemic drugs: Secondary | ICD-10-CM | POA: Insufficient documentation

## 2017-12-13 HISTORY — PX: COLONOSCOPY WITH PROPOFOL: SHX5780

## 2017-12-13 LAB — GLUCOSE, CAPILLARY: Glucose-Capillary: 99 mg/dL (ref 65–99)

## 2017-12-13 SURGERY — COLONOSCOPY WITH PROPOFOL
Anesthesia: General

## 2017-12-13 MED ORDER — PROPOFOL 500 MG/50ML IV EMUL
INTRAVENOUS | Status: AC
  Filled 2017-12-13: qty 50

## 2017-12-13 MED ORDER — PHENYLEPHRINE HCL 10 MG/ML IJ SOLN
INTRAMUSCULAR | Status: DC | PRN
  Administered 2017-12-13 (×3): 100 ug via INTRAVENOUS

## 2017-12-13 MED ORDER — PROPOFOL 500 MG/50ML IV EMUL
INTRAVENOUS | Status: DC | PRN
  Administered 2017-12-13: 100 ug/kg/min via INTRAVENOUS

## 2017-12-13 MED ORDER — SODIUM CHLORIDE 0.9 % IV SOLN
INTRAVENOUS | Status: DC
  Administered 2017-12-13: 1000 mL via INTRAVENOUS

## 2017-12-13 MED ORDER — LIDOCAINE HCL (PF) 1 % IJ SOLN
2.0000 mL | Freq: Once | INTRAMUSCULAR | Status: AC
  Administered 2017-12-13: 0.3 mL via INTRADERMAL

## 2017-12-13 MED ORDER — LIDOCAINE HCL (PF) 1 % IJ SOLN
INTRAMUSCULAR | Status: AC
  Administered 2017-12-13: 0.3 mL via INTRADERMAL
  Filled 2017-12-13: qty 2

## 2017-12-13 MED ORDER — PROPOFOL 10 MG/ML IV BOLUS
INTRAVENOUS | Status: DC | PRN
  Administered 2017-12-13 (×2): 50 mg via INTRAVENOUS
  Administered 2017-12-13: 20 mg via INTRAVENOUS

## 2017-12-13 MED ORDER — SODIUM CHLORIDE 0.9 % IV SOLN: INTRAVENOUS | Status: DC

## 2017-12-13 MED ORDER — LIDOCAINE HCL (CARDIAC) 20 MG/ML IV SOLN
INTRAVENOUS | Status: DC | PRN
  Administered 2017-12-13: 50 mg via INTRAVENOUS

## 2017-12-13 NOTE — Anesthesia Preprocedure Evaluation (Signed)
Anesthesia Evaluation  Patient identified by MRN, date of birth, ID band  Reviewed: Allergy & Precautions, NPO status , Patient's Chart, lab work & pertinent test results  Airway Mallampati: II  TM Distance: >3 FB     Dental   Pulmonary former smoker,  Spinal stenosis   Pulmonary exam normal        Cardiovascular hypertension, + CAD  Normal cardiovascular exam     Neuro/Psych negative neurological ROS  negative psych ROS   GI/Hepatic GERD  ,  Endo/Other  diabetes, Well Controlled, Type 2  Renal/GU   negative genitourinary   Musculoskeletal   Abdominal Normal abdominal exam  (+)   Peds negative pediatric ROS (+)  Hematology   Anesthesia Other Findings Past Medical History: 04/12/2012: Acid reflux 04/12/2012: Arteriosclerosis of coronary artery     Comment:  Overview:  1.  Atherosclerotic coronary artery disease.               a.  normal LV function.  Coronary arteriography 5/98               revealed 50% LAD lesion, 95% lesion in large diagonal               branch. Left circumflex was normal.  25% proximal RCA               lesion.  50% lesion in the PDA.    b.  PTCA of the first               diagonal and placement of three multi-link stents.    c.               recent ETT Cardiolite revealed low probability for               ischemia.     d.  cardiac catheterization on 05/18/97 per               [name] revealed 80% LAD, 70% proximal, 90% mid               first AL, 30% RCA.     e.  status post coronary bypass               grafting x 2 per [name].    f.  recurrent chest               pain with exertion, equivocal ETT.    g.  repeat cardiac               catheterization revealed an occluded vein graft.     h.                medical therapy.    i.  ETT Myoview showed borderline               changes.     j.  cardiac catheterization 8/02 revealed no              significant change in anatomy from previous  cath.     k.               cardiac cath 06/19/05 revealed patent left internal               mammary artery to occluded LAD. Saphenous vein to D1 was               chronically occ 11/19/2015: Benign essential HTN 03/05/2014: Benign fibroma of prostate  03/05/2014: Cannot sleep 04/12/2012: Colon polyp 11/19/2015: HLD (hyperlipidemia) 04/12/2012: Presence of artificial eye 04/12/2012: Spinal stenosis 03/05/2014: Type 2 diabetes mellitus ([facility])  Reproductive/Obstetrics                             Anesthesia Physical Anesthesia Plan  ASA: III  Anesthesia Plan: General   Post-op Pain Management:    Induction: Intravenous  PONV Risk Score and Plan:   Airway Management Planned: Nasal Cannula  Additional Equipment:   Intra-op Plan:   Post-operative Plan:   Informed Consent: I have reviewed the patients History and Physical, chart, labs and discussed the procedure including the risks, benefits and alternatives for the proposed anesthesia with the patient or authorized representative who has indicated his/her understanding and acceptance.   Dental advisory given  Plan Discussed with: CRNA and Surgeon  Anesthesia Plan Comments:         Anesthesia Quick Evaluation

## 2017-12-13 NOTE — Op Note (Addendum)
[facility] Gastroenterology Patient Name: [name] Procedure Date: 12/13/2017 12:09 PM MRN: [id number] Account #: [id number] Date of Birth: Feb 13, 1951 Admit Type: Outpatient Age: 67 Room: [facility] ENDO ROOM 3 Gender: Male Note Status: Finalized Procedure:            Colonoscopy Providers:            [name], MD Referring MD:         Ardyth Man. Santayana (Referring MD) Complications:        No immediate complications. Procedure:            Pre-Anesthesia Assessment:                       - ASA Grade Assessment: III - A patient with severe                        systemic disease.                       After obtaining informed consent, the colonoscope was                        passed under direct vision. Throughout the procedure,                        the patient's blood pressure, pulse, and oxygen                        saturations were monitored continuously. The                        Colonoscope was introduced through the anus and                        advanced to the the cecum, identified by appendiceal                        orifice and ileocecal valve. Findings:      Multiple small to medium diverticula were found in the sigmoid colon,       descending colon, transverse colon and ascending colon. Biopsies for       histology were taken with a cold forceps from the left colon for       evaluation of microscopic colitis.      Two sessile polyps were found in the proximal descending colon. The       polyps were 7 to 10 mm in size.      The retroflexed view of the distal rectum and anal verge was normal and       showed no anal or rectal abnormalities.      The digital rectal exam was normal. Impression:           - Diverticulosis in the sigmoid colon, in the                        descending colon, in the transverse colon and in the                        ascending colon. Biopsied.                       -  Two 7 to 10 mm polyps in the proximal  descending                        colon.                       - The distal rectum and anal verge are normal on                        retroflexion view. Recommendation:       - Discharge patient to home.                       - Telephone GI clinic for pathology results in 1 week.                       - Soft diet today, then advance as tolerated to advance                        diet as tolerated. Procedure Code(s):    --- Professional ---                       [phone], Colonoscopy, flexible; with biopsy, single or                        multiple Diagnosis Code(s):    --- Professional ---                       D12.4, Benign neoplasm of descending colon                       K57.30, Diverticulosis of large intestine without                        perforation or abscess without bleeding CPT copyright 2016 American Medical Association. All rights reserved. The codes documented in this report are preliminary and upon coder review may  be revised to meet current compliance requirements. [name], MD 12/13/2017 1:00:43 PM This report has been signed electronically. Number of Addenda: 0 Note Initiated On: 12/13/2017 12:09 PM Scope Withdrawal Time: 0 hours 19 minutes 50 seconds  Total Procedure Duration: 0 hours 32 minutes 40 seconds       [facility]

## 2017-12-13 NOTE — Anesthesia Procedure Notes (Signed)
Procedure Name: MAC Date/Time: 12/13/2017 12:23 PM Performed by: Rudean Hitt, CRNA Pre-anesthesia Checklist: Patient identified, Emergency Drugs available, Suction available and Patient being monitored Oxygen Delivery Method: Nasal cannula Induction Type: IV induction

## 2017-12-13 NOTE — H&P (Signed)
Outpatient short stay form Pre-procedure 12/13/2017 12:16 PM [name] MD  Primary Physician: Arrie Aran MD  Reason for visit: Colonoscopy  History of present illness: Patient is a 67 year old male presenting today as above.  His last colonoscopy was over 10 years ago.  Does have possible history of colon polyps.  He tolerated his prep well.  He does take 325 mg aspirin daily as well as Plavix.  Both of these agents have been held for a week.  Takes no other aspirin products or blood thinners.    Current Facility-Administered Medications:  .  0.9 %  sodium chloride infusion, , Intravenous, Continuous, [name], MD, Last Rate: 20 mL/hr at 12/13/17 1141, 1,000 mL at 12/13/17 1141 .  0.9 %  sodium chloride infusion, , Intravenous, Continuous, [name], MD  Medications Prior to Admission  Medication Sig Dispense Refill Last Dose  . aspirin EC 325 MG tablet Take by mouth.   12/07/2017  . atenolol (TENORMIN) 50 MG tablet Take by mouth.   Taking  . eszopiclone (LUNESTA) 2 MG TABS tablet Take 2 mg by mouth at bedtime as needed for sleep. Take immediately before bedtime   Taking  . glimepiride (AMARYL) 4 MG tablet Take by mouth.   Taking  . lansoprazole (PREVACID) 30 MG capsule    Taking  . linagliptin (TRADJENTA) 5 MG TABS tablet Take by mouth.   Taking  . lisinopril (PRINIVIL,ZESTRIL) 5 MG tablet Take 5 mg by mouth daily.   Taking  . metFORMIN (GLUCOPHAGE) 500 MG tablet    Taking  . rosuvastatin (CRESTOR) 40 MG tablet Take by mouth.   Taking     Allergies  Allergen Reactions  . Penicillins Rash    Childhood. Did not require medical care.     Past Medical History:  Diagnosis Date  . Acid reflux 04/12/2012  . Arteriosclerosis of coronary artery 04/12/2012   Overview:  1.  Atherosclerotic coronary artery disease.    a.  normal LV function.  Coronary arteriography 5/98 revealed 50% LAD lesion, 95% lesion in large diagonal branch. Left circumflex was normal.   25% proximal RCA lesion.  50% lesion in the PDA.    b.  PTCA of the first diagonal and placement of three multi-link stents.    c.  recent ETT Cardiolite revealed low probability for ischemia.     d.  cardiac catheterization on 05/18/97 per [name] revealed 80% LAD, 70% proximal, 90% mid first AL, 30% RCA.     e.  status post coronary bypass grafting x 2 per [name].    f.  recurrent chest pain with exertion, equivocal ETT.    g.  repeat cardiac catheterization revealed an occluded vein graft.     h.  medical therapy.    i.  ETT Myoview showed borderline changes.     j.  cardiac catheterization 8/02 revealed no significant change in anatomy from previous cath.     k. cardiac cath 06/19/05 revealed patent left internal mammary artery to occluded LAD. Saphenous vein to D1 was chronically occ  . Benign essential HTN 11/19/2015  . Benign fibroma of prostate 03/05/2014  . Cannot sleep 03/05/2014  . Colon polyp 04/12/2012  . HLD (hyperlipidemia) 11/19/2015  . Presence of artificial eye 04/12/2012  . Spinal stenosis 04/12/2012  . Type 2 diabetes mellitus ([place]) 03/05/2014    Review of systems:      Physical Exam    Heart and lungs: Regular rate and rhythm without  rub or gallop, lungs are bilaterally clear.    HEENT: Normocephalic atraumatic eyes are anicteric    Other:    Pertinant exam for procedure: Soft nontender nondistended bowel sounds positive normoactive.    Planned proceedures: Colonoscopy and indicated procedures. I have discussed the risks benefits and complications of procedures to include not limited to bleeding, infection, perforation and the risk of sedation and the patient wishes to proceed.    [name], MD Gastroenterology 12/13/2017  12:16 PM

## 2017-12-13 NOTE — Anesthesia Postprocedure Evaluation (Signed)
Anesthesia Post Note  Patient: [name]  Procedure(s) Performed: COLONOSCOPY WITH PROPOFOL (N/A )  Patient location during evaluation: PACU Anesthesia Type: General Level of consciousness: awake and alert and oriented Pain management: pain level controlled Vital Signs Assessment: post-procedure vital signs reviewed and stable Respiratory status: spontaneous breathing Cardiovascular status: blood pressure returned to baseline Anesthetic complications: no     Last Vitals:  Vitals:   12/13/17 1301 12/13/17 1321  BP: 114/80 (!) 143/94  Pulse: 69 63  Resp: 18 16  Temp: (!) 36.1 C   SpO2: 100% 100%    Last Pain:  Vitals:   12/13/17 1321  TempSrc:   PainSc: 0-No pain                 Quaneisha Hanisch

## 2017-12-13 NOTE — Anesthesia Post-op Follow-up Note (Signed)
Anesthesia QCDR form completed.        

## 2017-12-13 NOTE — Transfer of Care (Signed)
Immediate Anesthesia Transfer of Care Note  Patient: [name]  Procedure(s) Performed: COLONOSCOPY WITH PROPOFOL (N/A )  Patient Location: Endoscopy Unit  Anesthesia Type:General  Level of Consciousness: awake and alert   Airway & Oxygen Therapy: Patient Spontanous Breathing and Patient connected to nasal cannula oxygen  Post-op Assessment: Report given to RN and Post -op Vital signs reviewed and stable  Post vital signs: Reviewed  Last Vitals:  Vitals Value Taken Time  BP    Temp    Pulse    Resp    SpO2      Last Pain:  Vitals:   12/13/17 1120  TempSrc: Tympanic  PainSc: 0-No pain         Complications: No apparent anesthesia complications

## 2017-12-14 LAB — SURGICAL PATHOLOGY

## 2018-02-22 ENCOUNTER — Emergency Department
Admission: EM | Admit: 2018-02-22 | Discharge: 2018-02-22 | Disposition: A | Discharge status: Home / Self Care | Payer: BLUE CROSS/BLUE SHIELD | Attending: Emergency Medicine | Admitting: Emergency Medicine

## 2018-02-22 ENCOUNTER — Emergency Department: Payer: BLUE CROSS/BLUE SHIELD

## 2018-02-22 ENCOUNTER — Other Ambulatory Visit: Payer: Self-pay

## 2018-02-22 DIAGNOSIS — Z951 Presence of aortocoronary bypass graft: Secondary | ICD-10-CM | POA: Insufficient documentation

## 2018-02-22 DIAGNOSIS — Z7982 Long term (current) use of aspirin: Secondary | ICD-10-CM | POA: Insufficient documentation

## 2018-02-22 DIAGNOSIS — Z7984 Long term (current) use of oral hypoglycemic drugs: Secondary | ICD-10-CM | POA: Insufficient documentation

## 2018-02-22 DIAGNOSIS — I2510 Atherosclerotic heart disease of native coronary artery without angina pectoris: Secondary | ICD-10-CM | POA: Diagnosis not present

## 2018-02-22 DIAGNOSIS — Z87891 Personal history of nicotine dependence: Secondary | ICD-10-CM | POA: Insufficient documentation

## 2018-02-22 DIAGNOSIS — M898X1 Other specified disorders of bone, shoulder: Secondary | ICD-10-CM

## 2018-02-22 DIAGNOSIS — Z79899 Other long term (current) drug therapy: Secondary | ICD-10-CM | POA: Diagnosis not present

## 2018-02-22 DIAGNOSIS — E119 Type 2 diabetes mellitus without complications: Secondary | ICD-10-CM | POA: Diagnosis not present

## 2018-02-22 DIAGNOSIS — M25511 Pain in right shoulder: Secondary | ICD-10-CM | POA: Diagnosis present

## 2018-02-22 DIAGNOSIS — I10 Essential (primary) hypertension: Secondary | ICD-10-CM | POA: Diagnosis not present

## 2018-02-22 NOTE — ED Notes (Signed)
See triage note  States he developed pain to right clavicle   States pain started after lifting something at work   No deformity noted.

## 2018-02-22 NOTE — ED Provider Notes (Signed)
[facility] Emergency Department Provider Note ____________________________________________  Time seen: Approximately 3:04 PM  I have reviewed the triage vital signs and the nursing notes.   HISTORY  Chief Complaint Clavicle Injury    HPI [name] is a 67 y.o. male who presents to the emergency department for evaluation and treatment of right side collar bone pain that started after lifting a box while at work. No alleviating measures attempted prior to arrival.  Past Medical History:  Diagnosis Date  . Acid reflux 04/12/2012  . Arteriosclerosis of coronary artery 04/12/2012   Overview:  1.  Atherosclerotic coronary artery disease.    a.  normal LV function.  Coronary arteriography 5/98 revealed 50% LAD lesion, 95% lesion in large diagonal branch. Left circumflex was normal.  25% proximal RCA lesion.  50% lesion in the PDA.    b.  PTCA of the first diagonal and placement of three multi-link stents.    c.  recent ETT Cardiolite revealed low probability for ischemia.     d.  cardiac catheterization on 05/18/97 per [name] revealed 80% LAD, 70% proximal, 90% mid first AL, 30% RCA.     e.  status post coronary bypass grafting x 2 per [name].    f.  recurrent chest pain with exertion, equivocal ETT.    g.  repeat cardiac catheterization revealed an occluded vein graft.     h.  medical therapy.    i.  ETT Myoview showed borderline changes.     j.  cardiac catheterization 8/02 revealed no significant change in anatomy from previous cath.     k. cardiac cath 06/19/05 revealed patent left internal mammary artery to occluded LAD. Saphenous vein to D1 was chronically occ  . Benign essential HTN 11/19/2015  . Benign fibroma of prostate 03/05/2014  . Cannot sleep 03/05/2014  . Colon polyp 04/12/2012  . HLD (hyperlipidemia) 11/19/2015  . Presence of artificial eye 04/12/2012  . Spinal stenosis 04/12/2012  . Type 2 diabetes mellitus ([place]) 03/05/2014    Patient Active  Problem List   Diagnosis Date Noted  . HLD (hyperlipidemia) 11/19/2015  . Benign essential HTN 11/19/2015  . Benign fibroma of prostate 03/05/2014  . Type 2 diabetes mellitus ([place]) 03/05/2014  . Cannot sleep 03/05/2014  . Arteriosclerosis of coronary artery 04/12/2012  . Colon polyp 04/12/2012  . Presence of artificial eye 04/12/2012  . Acid reflux 04/12/2012  . Spinal stenosis 04/12/2012    Past Surgical History:  Procedure Laterality Date  . COLONOSCOPY WITH PROPOFOL N/A 12/13/2017   Procedure: COLONOSCOPY WITH PROPOFOL;  Surgeon: [name], MD;  Location: [facility] ENDOSCOPY;  Service: Endoscopy;  Laterality: N/A;  . CORONARY ARTERY BYPASS GRAFT  1998  . CORONARY STENT PLACEMENT  2006    Prior to Admission medications   Medication Sig Start Date End Date Taking? Authorizing Provider  aspirin EC 325 MG tablet Take by mouth.    [provider]  atenolol (TENORMIN) 50 MG tablet Take by mouth. 08/01/15 07/31/16  [provider]  eszopiclone (LUNESTA) 2 MG TABS tablet Take 2 mg by mouth at bedtime as needed for sleep. Take immediately before bedtime    [provider]  glimepiride (AMARYL) 4 MG tablet Take by mouth. 08/01/15 07/31/16  [provider]  lansoprazole (PREVACID) 30 MG capsule  08/20/15   [provider]  linagliptin (TRADJENTA) 5 MG TABS tablet Take by mouth. 08/01/15 07/31/16  [provider]  lisinopril (PRINIVIL,ZESTRIL) 5 MG tablet  Take 5 mg by mouth daily.    [provider]  metFORMIN (GLUCOPHAGE) 500 MG tablet  08/01/15   [provider]  rosuvastatin (CRESTOR) 40 MG tablet Take by mouth. 08/01/15 07/31/16  [provider]    Allergies Penicillins  Family History  Problem Relation Age of Onset  . Nephrolithiasis Brother   . Prostate cancer Neg Hx   . Bladder Cancer Neg Hx   . Kidney cancer Neg Hx     Social History Social History   Tobacco Use  . Smoking status: Former  Research scientist (life sciences)  . Smokeless tobacco: Never Used  Substance Use Topics  . Alcohol use: Yes    Alcohol/week: 0.0 oz  . Drug use: No    Review of Systems Constitutional: Negative for fever. Cardiovascular: Negative for chest pain. Respiratory: Negative for shortness of breath. Musculoskeletal: Positive for right clavicle pain Skin: Positive for palpable difference in right v/s left clavicle  Neurological: Negative for decrease in sensation  ____________________________________________   PHYSICAL EXAM:  VITAL SIGNS: ED Triage Vitals [02/22/18 1458]  Enc Vitals Group     BP (!) 142/79     Pulse Rate 74     Resp      Temp 98.2 F (36.8 C)     Temp Source Oral     SpO2 99 %     Weight 188 lb (85.3 kg)     Height 6' (1.829 m)     Head Circumference      Peak Flow      Pain Score 5     Pain Loc      Pain Edu?      Excl. in [place]?     Constitutional: Alert and oriented. Well appearing and in no acute distress. Eyes: Conjunctivae are clear without discharge or drainage Head: Atraumatic Neck: Supple. No focal, midline tenderness Respiratory: No cough. Respirations are even and unlabored. Musculoskeletal: Focal tenderness over the medial clavicle on the right side.  Neurologic: Awake, alert, oriented x 4  Skin: Intact on exposed surface. Soft, tender, elevated area over the medial right clavicle.  Psychiatric: Affect and behavior are appropriate.  ____________________________________________   LABS (all labs ordered are listed, but only abnormal results are displayed)  Labs Reviewed - No data to display ____________________________________________  RADIOLOGY  No bony abnormality of the right clavicle per radiology. ____________________________________________   PROCEDURES  Procedures  ____________________________________________   INITIAL IMPRESSION / ASSESSMENT AND PLAN / ED COURSE  [name] is a 67 y.o. who presents to the emergency department for  treatment and evaluation of pain over the right clavicle after lifting a box. No evidence of fracture on imaging. Reassurance provided. He is to take tylenol or ibuprofen for pain as needed. He is to apply ice 50min/hour while awake for the next few days. He is to return to the ER for symptoms that change or worsen if unable to see his PCP or orthopedics.  Medications - No data to display  Pertinent labs & imaging results that were available during my care of the patient were reviewed by me and considered in my medical decision making (see chart for details).  _________________________________________   FINAL CLINICAL IMPRESSION(S) / ED DIAGNOSES  Final diagnoses:  Pain of right clavicle    ED Discharge Orders    None       If controlled substance prescribed during this visit, 12 month history viewed on the [place] prior to issuing an initial prescription for Schedule II or  III opiod.    Victorino Dike, FNP 02/22/18 1853    Darel Hong, MD 02/22/18 2337

## 2018-02-22 NOTE — ED Triage Notes (Signed)
Pt states he was lifting something out of a box while at work that weighed bout 30lb and felt something pull in the right side of neck and felt his collar bone pop and states it doesn't look right. 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

## 2018-05-24 DIAGNOSIS — E1159 Type 2 diabetes mellitus with other circulatory complications: Secondary | ICD-10-CM | POA: Insufficient documentation

## 2018-05-24 DIAGNOSIS — Z794 Long term (current) use of insulin: Secondary | ICD-10-CM | POA: Insufficient documentation

## 2018-05-24 DIAGNOSIS — E1165 Type 2 diabetes mellitus with hyperglycemia: Secondary | ICD-10-CM | POA: Insufficient documentation

## 2018-05-24 DIAGNOSIS — E1169 Type 2 diabetes mellitus with other specified complication: Secondary | ICD-10-CM | POA: Insufficient documentation

## 2018-06-01 ENCOUNTER — Emergency Department: Payer: BLUE CROSS/BLUE SHIELD

## 2018-06-01 ENCOUNTER — Other Ambulatory Visit: Payer: Self-pay

## 2018-06-01 ENCOUNTER — Encounter: Payer: Self-pay | Admitting: *Deleted

## 2018-06-01 ENCOUNTER — Emergency Department
Admission: EM | Admit: 2018-06-01 | Discharge: 2018-06-01 | Disposition: A | Discharge status: Home / Self Care | Payer: BLUE CROSS/BLUE SHIELD | Attending: Emergency Medicine | Admitting: Emergency Medicine

## 2018-06-01 DIAGNOSIS — Z7984 Long term (current) use of oral hypoglycemic drugs: Secondary | ICD-10-CM | POA: Insufficient documentation

## 2018-06-01 DIAGNOSIS — S6992XA Unspecified injury of left wrist, hand and finger(s), initial encounter: Secondary | ICD-10-CM | POA: Diagnosis present

## 2018-06-01 DIAGNOSIS — Z87891 Personal history of nicotine dependence: Secondary | ICD-10-CM | POA: Insufficient documentation

## 2018-06-01 DIAGNOSIS — Z79899 Other long term (current) drug therapy: Secondary | ICD-10-CM | POA: Insufficient documentation

## 2018-06-01 DIAGNOSIS — Z950 Presence of cardiac pacemaker: Secondary | ICD-10-CM | POA: Diagnosis not present

## 2018-06-01 DIAGNOSIS — Y939 Activity, unspecified: Secondary | ICD-10-CM | POA: Diagnosis not present

## 2018-06-01 DIAGNOSIS — Y929 Unspecified place or not applicable: Secondary | ICD-10-CM | POA: Diagnosis not present

## 2018-06-01 DIAGNOSIS — Z7982 Long term (current) use of aspirin: Secondary | ICD-10-CM | POA: Insufficient documentation

## 2018-06-01 DIAGNOSIS — S61412A Laceration without foreign body of left hand, initial encounter: Secondary | ICD-10-CM | POA: Insufficient documentation

## 2018-06-01 DIAGNOSIS — Y999 Unspecified external cause status: Secondary | ICD-10-CM | POA: Diagnosis not present

## 2018-06-01 DIAGNOSIS — I10 Essential (primary) hypertension: Secondary | ICD-10-CM | POA: Diagnosis not present

## 2018-06-01 DIAGNOSIS — E119 Type 2 diabetes mellitus without complications: Secondary | ICD-10-CM | POA: Insufficient documentation

## 2018-06-01 DIAGNOSIS — W311XXA Contact with metalworking machines, initial encounter: Secondary | ICD-10-CM | POA: Insufficient documentation

## 2018-06-01 MED ORDER — SULFAMETHOXAZOLE-TRIMETHOPRIM 800-160 MG PO TABS: 1.0000 | ORAL_TABLET | Freq: Two times a day (BID) | ORAL | 0 refills | Status: AC

## 2018-06-01 MED ORDER — LIDOCAINE HCL (PF) 1 % IJ SOLN: 5.0000 mL | Freq: Once | INTRAMUSCULAR | Status: DC

## 2018-06-01 MED ORDER — LIDOCAINE HCL (PF) 1 % IJ SOLN
INTRAMUSCULAR | Status: AC
  Filled 2018-06-01: qty 5

## 2018-06-01 NOTE — ED Triage Notes (Signed)
Pt has laceration to left thumb.  Pt states copper cut thumb while using a drill.  Bleeding controlled.

## 2018-06-01 NOTE — ED Provider Notes (Signed)
[facility] Emergency Department Provider Note  ____________________________________________  Time seen: Approximately 7:16 PM  I have reviewed the triage vital signs and the nursing notes.   HISTORY  Chief Complaint Laceration    HPI [name] is a 67 y.o. male presents to the emergency department with a 3 cm lateral left thumb laceration sustained accidentally while using a drill to make a flavor booster for beer.  Patient denies numbness and tingling of the left hand.  He has been able to move all 5 left fingers since incident occurred.  His tetanus status is up-to-date.  No alleviating measures have been attempted.   Past Medical History:  Diagnosis Date  . Acid reflux 04/12/2012  . Arteriosclerosis of coronary artery 04/12/2012   Overview:  1.  Atherosclerotic coronary artery disease.    a.  normal LV function.  Coronary arteriography 5/98 revealed 50% LAD lesion, 95% lesion in large diagonal branch. Left circumflex was normal.  25% proximal RCA lesion.  50% lesion in the PDA.    b.  PTCA of the first diagonal and placement of three multi-link stents.    c.  recent ETT Cardiolite revealed low probability for ischemia.     d.  cardiac catheterization on 05/18/97 per [name] revealed 80% LAD, 70% proximal, 90% mid first AL, 30% RCA.     e.  status post coronary bypass grafting x 2 per [name].    f.  recurrent chest pain with exertion, equivocal ETT.    g.  repeat cardiac catheterization revealed an occluded vein graft.     h.  medical therapy.    i.  ETT Myoview showed borderline changes.     j.  cardiac catheterization 8/02 revealed no significant change in anatomy from previous cath.     k. cardiac cath 06/19/05 revealed patent left internal mammary artery to occluded LAD. Saphenous vein to D1 was chronically occ  . Benign essential HTN 11/19/2015  . Benign fibroma of prostate 03/05/2014  . Cannot sleep 03/05/2014  . Colon polyp 04/12/2012  . HLD  (hyperlipidemia) 11/19/2015  . Presence of artificial eye 04/12/2012  . Spinal stenosis 04/12/2012  . Type 2 diabetes mellitus ([place]) 03/05/2014    Patient Active Problem List   Diagnosis Date Noted  . HLD (hyperlipidemia) 11/19/2015  . Benign essential HTN 11/19/2015  . Benign fibroma of prostate 03/05/2014  . Type 2 diabetes mellitus ([place]) 03/05/2014  . Cannot sleep 03/05/2014  . Arteriosclerosis of coronary artery 04/12/2012  . Colon polyp 04/12/2012  . Presence of artificial eye 04/12/2012  . Acid reflux 04/12/2012  . Spinal stenosis 04/12/2012    Past Surgical History:  Procedure Laterality Date  . COLONOSCOPY WITH PROPOFOL N/A 12/13/2017   Procedure: COLONOSCOPY WITH PROPOFOL;  Surgeon: [name], MD;  Location: [facility] ENDOSCOPY;  Service: Endoscopy;  Laterality: N/A;  . CORONARY ARTERY BYPASS GRAFT  1998  . CORONARY STENT PLACEMENT  2006    Prior to Admission medications   Medication Sig Start Date End Date Taking? Authorizing Provider  aspirin EC 325 MG tablet Take by mouth.    [provider]  atenolol (TENORMIN) 50 MG tablet Take by mouth. 08/01/15 07/31/16  [provider]  eszopiclone (LUNESTA) 2 MG TABS tablet Take 2 mg by mouth at bedtime as needed for sleep. Take immediately before bedtime    [provider]  glimepiride (AMARYL) 4 MG tablet Take by mouth. 08/01/15 07/31/16  [provider]  lansoprazole (PREVACID)  30 MG capsule  08/20/15   [provider]  linagliptin (TRADJENTA) 5 MG TABS tablet Take by mouth. 08/01/15 07/31/16  [provider]  lisinopril (PRINIVIL,ZESTRIL) 5 MG tablet Take 5 mg by mouth daily.    [provider]  metFORMIN (GLUCOPHAGE) 500 MG tablet  08/01/15   [provider]  rosuvastatin (CRESTOR) 40 MG tablet Take by mouth. 08/01/15 07/31/16  [provider]  sulfamethoxazole-trimethoprim (BACTRIM DS,SEPTRA DS) 800-160 MG tablet Take 1 tablet by mouth 2 (two)  times daily for 7 days. 06/01/18 06/08/18  Lannie Fields, PA-C    Allergies Penicillins  Family History  Problem Relation Age of Onset  . Nephrolithiasis Brother   . Prostate cancer Neg Hx   . Bladder Cancer Neg Hx   . Kidney cancer Neg Hx     Social History Social History   Tobacco Use  . Smoking status: Former Research scientist (life sciences)  . Smokeless tobacco: Never Used  Substance Use Topics  . Alcohol use: Yes    Alcohol/week: 0.0 standard drinks  . Drug use: No     Review of Systems  Constitutional: No fever/chills Eyes: No visual changes. No discharge ENT: No upper respiratory complaints. Cardiovascular: no chest pain. Respiratory: no cough. No SOB. Gastrointestinal: No abdominal pain.  No nausea, no vomiting.  No diarrhea.  No constipation. Genitourinary: Negative for dysuria. No hematuria Musculoskeletal: Negative for musculoskeletal pain. Skin: Patient has left thumb laceration.  Neurological: Negative for headaches, focal weakness or numbness.  ____________________________________________   PHYSICAL EXAM:  VITAL SIGNS: ED Triage Vitals  Enc Vitals Group     BP 06/01/18 1636 (!) 156/72     Pulse Rate 06/01/18 1636 81     Resp 06/01/18 1636 18     Temp 06/01/18 1636 99 F (37.2 C)     Temp Source 06/01/18 1636 Oral     SpO2 06/01/18 1636 96 %     Weight 06/01/18 1635 185 lb (83.9 kg)     Height 06/01/18 1635 6' (1.829 m)     Head Circumference --      Peak Flow --      Pain Score 06/01/18 1635 8     Pain Loc --      Pain Edu? --      Excl. in [place]? --      Constitutional: Alert and oriented. Well appearing and in no acute distress. Eyes: Conjunctivae are normal. PERRL. EOMI. Head: Atraumatic. Cardiovascular: Normal rate, regular rhythm. Normal S1 and S2.  Good peripheral circulation. Respiratory: Normal respiratory effort without tachypnea or retractions. Lungs CTAB. Good air entry to the bases with no decreased or absent breath sounds. Musculoskeletal: Patient  is able to perform full range of motion of the left thumb.  No flexor or extensor tendon deficits appreciated with testing.  Palpable radial pulse, left. Neurologic:  Normal speech and language. No gross focal neurologic deficits are appreciated.  Skin: Patient has 3 cm left thumb laceration without tendon exposure.  Psychiatric: Mood and affect are normal. Speech and behavior are normal. Patient exhibits appropriate insight and judgement.   ____________________________________________   LABS (all labs ordered are listed, but only abnormal results are displayed)  Labs Reviewed - No data to display ____________________________________________  EKG   ____________________________________________  RADIOLOGY I personally viewed and evaluated these images as part of my medical decision making, as well as reviewing the written report by the radiologist.  Dg Finger Thumb Left  Result Date: 06/01/2018 CLINICAL DATA:  Laceration  to the distal thumb EXAM: LEFT THUMB 2+V COMPARISON:  None. FINDINGS: No fracture or malalignment. No radiopaque foreign body in the soft tissues. IMPRESSION: No acute osseous abnormality. Electronically Signed   By: Donavan Foil M.D.   On: 06/01/2018 17:33    ____________________________________________    PROCEDURES  Procedure(s) performed:    Procedures  LACERATION REPAIR Performed by: Lannie Fields Authorized by: Lannie Fields Consent: Verbal consent obtained. Risks and benefits: risks, benefits and alternatives were discussed Consent given by: patient Patient identity confirmed: provided demographic data Prepped and Draped in normal sterile fashion Wound explored  Laceration Location: Left thumb   Laceration Length: 3 cm  No Foreign Bodies seen or palpated  Anesthesia: local infiltration  Local anesthetic: lidocaine 1% without epinephrine  Anesthetic total: 5 ml  Irrigation method: syringe Amount of cleaning: 500 cc of normal saline  with betadine.   Skin closure: 5-0 Ethilon   Number of sutures: 14  Technique: Simple Interrupted   Patient tolerance: Patient tolerated the procedure well with no immediate complications.    Medications  lidocaine (PF) (XYLOCAINE) 1 % injection 5 mL (has no administration in time range)  lidocaine (PF) (XYLOCAINE) 1 % injection (has no administration in time range)  lidocaine (PF) (XYLOCAINE) 1 % injection 5 mL (has no administration in time range)  lidocaine (PF) (XYLOCAINE) 1 % injection (has no administration in time range)     ____________________________________________   INITIAL IMPRESSION / ASSESSMENT AND PLAN / ED COURSE  Pertinent labs & imaging results that were available during my care of the patient were reviewed by me and considered in my medical decision making (see chart for details).  Review of the  [place] was performed in accordance of the [place] prior to dispensing any controlled drugs.      Assessment and plan Laceration repair Patient presents to the emergency department with a 3 cm left thumb laceration repaired in the emergency department without complication.  No flexor or extensor tendon deficits were appreciated with testing.  Patient was placed in a thumb splint in the emergency department.  He was advised to have sutures removed by primary care in 7 days.  Patient was advised to follow-up with Dr. Peggye Ley as needed.  He was discharged with Bactrim.  All patient questions were answered.   ____________________________________________  FINAL CLINICAL IMPRESSION(S) / ED DIAGNOSES  Final diagnoses:  Laceration of left hand without foreign body, initial encounter      NEW MEDICATIONS STARTED DURING THIS VISIT:  ED Discharge Orders         Ordered    sulfamethoxazole-trimethoprim (BACTRIM DS,SEPTRA DS) 800-160 MG tablet  2 times daily     06/01/18 1821              This chart was dictated using voice recognition software/Dragon. Despite  best efforts to proofread, errors can occur which can change the meaning. Any change was purely unintentional.    Karren Cobble 06/01/18 2008    Orbie Pyo, MD 06/01/18 [phone]

## 2018-06-01 NOTE — ED Notes (Signed)
Pt ambulatory to POV without difficulty. VSS. NAD. Discharge instructions, RX and follow up reviewed. All questions and concerns addressed.  

## 2018-06-10 ENCOUNTER — Emergency Department
Admission: EM | Admit: 2018-06-10 | Discharge: 2018-06-10 | Disposition: A | Discharge status: Home / Self Care | Payer: BLUE CROSS/BLUE SHIELD | Attending: Emergency Medicine | Admitting: Emergency Medicine

## 2018-06-10 ENCOUNTER — Other Ambulatory Visit: Payer: Self-pay

## 2018-06-10 DIAGNOSIS — Z79899 Other long term (current) drug therapy: Secondary | ICD-10-CM | POA: Diagnosis not present

## 2018-06-10 DIAGNOSIS — Z87891 Personal history of nicotine dependence: Secondary | ICD-10-CM | POA: Diagnosis not present

## 2018-06-10 DIAGNOSIS — Z4800 Encounter for change or removal of nonsurgical wound dressing: Secondary | ICD-10-CM | POA: Insufficient documentation

## 2018-06-10 DIAGNOSIS — Z4802 Encounter for removal of sutures: Secondary | ICD-10-CM | POA: Diagnosis not present

## 2018-06-10 DIAGNOSIS — Z5189 Encounter for other specified aftercare: Secondary | ICD-10-CM

## 2018-06-10 NOTE — ED Triage Notes (Signed)
Pt to ED for suture removal from left thumb. No drainage or infection noted at this time. Blackening around the incision. No fevers. No pain per pt.

## 2018-06-10 NOTE — ED Provider Notes (Signed)
[facility] EMERGENCY DEPARTMENT Provider Note   CSN: [id number] Arrival date & time: 06/10/18  1602     History   Chief Complaint Chief Complaint  Patient presents with  . Suture / Staple Removal    HPI [name] is a 67 y.o. male presents today for recheck of his left thumb laceration.  Patient underwent left thumb laceration repair 9 days ago.  He is doing well.  Not having any pain.  He completed antibiotics 2 days ago.  Denies any warmth redness or drainage.  He is been working with a splint covering the thumb.  No numbness or tingling. HPI  Past Medical History:  Diagnosis Date  . Acid reflux 04/12/2012  . Arteriosclerosis of coronary artery 04/12/2012   Overview:  1.  Atherosclerotic coronary artery disease.    a.  normal LV function.  Coronary arteriography 5/98 revealed 50% LAD lesion, 95% lesion in large diagonal branch. Left circumflex was normal.  25% proximal RCA lesion.  50% lesion in the PDA.    b.  PTCA of the first diagonal and placement of three multi-link stents.    c.  recent ETT Cardiolite revealed low probability for ischemia.     d.  cardiac catheterization on 05/18/97 per [name] revealed 80% LAD, 70% proximal, 90% mid first AL, 30% RCA.     e.  status post coronary bypass grafting x 2 per [name].    f.  recurrent chest pain with exertion, equivocal ETT.    g.  repeat cardiac catheterization revealed an occluded vein graft.     h.  medical therapy.    i.  ETT Myoview showed borderline changes.     j.  cardiac catheterization 8/02 revealed no significant change in anatomy from previous cath.     k. cardiac cath 06/19/05 revealed patent left internal mammary artery to occluded LAD. Saphenous vein to D1 was chronically occ  . Benign essential HTN 11/19/2015  . Benign fibroma of prostate 03/05/2014  . Cannot sleep 03/05/2014  . Colon polyp 04/12/2012  . HLD (hyperlipidemia) 11/19/2015  . Presence of artificial eye 04/12/2012  . Spinal  stenosis 04/12/2012  . Type 2 diabetes mellitus ([place]) 03/05/2014    Patient Active Problem List   Diagnosis Date Noted  . HLD (hyperlipidemia) 11/19/2015  . Benign essential HTN 11/19/2015  . Benign fibroma of prostate 03/05/2014  . Type 2 diabetes mellitus ([place]) 03/05/2014  . Cannot sleep 03/05/2014  . Arteriosclerosis of coronary artery 04/12/2012  . Colon polyp 04/12/2012  . Presence of artificial eye 04/12/2012  . Acid reflux 04/12/2012  . Spinal stenosis 04/12/2012    Past Surgical History:  Procedure Laterality Date  . COLONOSCOPY WITH PROPOFOL N/A 12/13/2017   Procedure: COLONOSCOPY WITH PROPOFOL;  Surgeon: [name], MD;  Location[facility] ENDOSCOPY;  Service: Endoscopy;  Laterality: N/A;  . CORONARY ARTERY BYPASS GRAFT  1998  . CORONARY STENT PLACEMENT  2006        Home Medications    Prior to Admission medications   Medication Sig Start Date End Date Taking? Authorizing Provider  aspirin EC 325 MG tablet Take by mouth.    [provider]  atenolol (TENORMIN) 50 MG tablet Take by mouth. 08/01/15 07/31/16  [provider]  eszopiclone (LUNESTA) 2 MG TABS tablet Take 2 mg by mouth at bedtime as needed for sleep. Take immediately before bedtime    [provider]  glimepiride (AMARYL) 4 MG tablet Take by  mouth. 08/01/15 07/31/16  [provider]  lansoprazole (PREVACID) 30 MG capsule  08/20/15   [provider]  linagliptin (TRADJENTA) 5 MG TABS tablet Take by mouth. 08/01/15 07/31/16  [provider]  lisinopril (PRINIVIL,ZESTRIL) 5 MG tablet Take 5 mg by mouth daily.    [provider]  metFORMIN (GLUCOPHAGE) 500 MG tablet  08/01/15   [provider]  rosuvastatin (CRESTOR) 40 MG tablet Take by mouth. 08/01/15 07/31/16  [provider]    Family History Family History  Problem Relation Age of Onset  . Nephrolithiasis Brother   . Prostate cancer Neg Hx   . Bladder Cancer Neg Hx     . Kidney cancer Neg Hx     Social History Social History   Tobacco Use  . Smoking status: Former Research scientist (life sciences)  . Smokeless tobacco: Never Used  Substance Use Topics  . Alcohol use: Yes    Alcohol/week: 0.0 standard drinks  . Drug use: No     Allergies   Penicillins   Review of Systems Review of Systems  Constitutional: Negative for fever.  Musculoskeletal: Negative for arthralgias.  Skin: Positive for wound.  Neurological: Negative for numbness.     Physical Exam Updated Vital Signs BP (!) 154/67 (BP Location: Left Arm)   Pulse 64   Temp 98.2 F (36.8 C) (Oral)   Resp 16   Ht 6' (1.829 m)   Wt 83.9 kg   SpO2 97%   BMI 25.09 kg/m   Physical Exam  Constitutional: He is oriented to person, place, and time. He appears well-developed and well-nourished.  HENT:  Head: Normocephalic and atraumatic.  Eyes: Conjunctivae are normal.  Neck: Normal range of motion.  Cardiovascular: Normal rate.  Pulmonary/Chest: Effort normal. No respiratory distress.  Musculoskeletal: Normal range of motion.  Left thumb flap-like laceration is intact with sutures.  Sutures removed and Steri-Strips applied.  Sensation is intact throughout the thumb.  No tendon deficits noted.  No sign of any cellulitis or abscess formation.  Neurological: He is alert and oriented to person, place, and time.  Skin: Skin is warm. No rash noted.  Psychiatric: He has a normal mood and affect. His behavior is normal. Thought content normal.     ED Treatments / Results  Labs (all labs ordered are listed, but only abnormal results are displayed) Labs Reviewed - No data to display  EKG None  Radiology No results found.  Procedures .Suture Removal Date/Time: 06/10/2018 4:48 PM Performed by: Duanne Guess, PA-C Authorized by: Duanne Guess, PA-C   Consent:    Consent obtained:  Verbal   Consent given by:  Patient   Risks discussed:  Pain and wound separation   Alternatives discussed:  No  treatment Location:    Location:  Upper extremity   Upper extremity location:  Hand   Hand location:  L thumb Procedure details:    Wound appearance:  No signs of infection, good wound healing and clean   Number of sutures removed:  8 Post-procedure details:    Post-removal:  Steri-Strips applied   Patient tolerance of procedure:  Tolerated well, no immediate complications   (including critical care time)  Medications Ordered in ED Medications - No data to display   Initial Impression / Assessment and Plan / ED Course  I have reviewed the triage vital signs and the nursing notes.  Pertinent labs & imaging results that were available during my care of the patient were reviewed by me and considered  in my medical decision making (see chart for details).    Sutures removed, Steri-Strips applied.  No signs of infection.  Patient will continue to keep clean and dry for another 5 days until 2 weeks out from injury and then will start soaking and half-strength hydroperoxide and tap water.  He understands signs and symptoms return to ED for such as any increasing pain swelling warmth redness or drainage.  Final Clinical Impressions(s) / ED Diagnoses   Final diagnoses:  Visit for suture removal  Visit for wound check    ED Discharge Orders    None       Duanne Guess, PA-C 06/10/18 1648    Tegeler, Gwenyth Allegra, MD 06/13/18 [phone]

## 2018-06-10 NOTE — ED Notes (Signed)
Pt here to have stitches removed from left thumb - pt states that the area is not healing well and is draining

## 2018-06-10 NOTE — Discharge Instructions (Addendum)
5 days start soaking thumb and half hydrogen peroxide and tap water.  Allow Steri-Strips to come off on their own.  If any increasing pain swelling redness or drainage return to the emergency department or follow-up with orthopedics.

## 2018-06-23 ENCOUNTER — Other Ambulatory Visit: Payer: Self-pay | Admitting: Cardiology

## 2018-06-28 ENCOUNTER — Encounter
Admission: RE | Disposition: A | Discharge status: Home / Self Care | Payer: Self-pay | Source: Ambulatory Visit | Attending: Cardiology

## 2018-06-28 ENCOUNTER — Ambulatory Visit
Admission: RE | Admit: 2018-06-28 | Discharge: 2018-06-28 | Disposition: A | Discharge status: Home / Self Care | Payer: BLUE CROSS/BLUE SHIELD | Source: Ambulatory Visit | Attending: Cardiology | Admitting: Cardiology

## 2018-06-28 ENCOUNTER — Encounter: Payer: Self-pay | Admitting: *Deleted

## 2018-06-28 DIAGNOSIS — G4700 Insomnia, unspecified: Secondary | ICD-10-CM | POA: Diagnosis not present

## 2018-06-28 DIAGNOSIS — Z7984 Long term (current) use of oral hypoglycemic drugs: Secondary | ICD-10-CM | POA: Insufficient documentation

## 2018-06-28 DIAGNOSIS — E782 Mixed hyperlipidemia: Secondary | ICD-10-CM | POA: Diagnosis not present

## 2018-06-28 DIAGNOSIS — Z7902 Long term (current) use of antithrombotics/antiplatelets: Secondary | ICD-10-CM | POA: Insufficient documentation

## 2018-06-28 DIAGNOSIS — Z9001 Acquired absence of eye: Secondary | ICD-10-CM | POA: Insufficient documentation

## 2018-06-28 DIAGNOSIS — Z9049 Acquired absence of other specified parts of digestive tract: Secondary | ICD-10-CM | POA: Diagnosis not present

## 2018-06-28 DIAGNOSIS — R9430 Abnormal result of cardiovascular function study, unspecified: Secondary | ICD-10-CM | POA: Insufficient documentation

## 2018-06-28 DIAGNOSIS — Z79899 Other long term (current) drug therapy: Secondary | ICD-10-CM | POA: Insufficient documentation

## 2018-06-28 DIAGNOSIS — Z9889 Other specified postprocedural states: Secondary | ICD-10-CM | POA: Insufficient documentation

## 2018-06-28 DIAGNOSIS — R0602 Shortness of breath: Secondary | ICD-10-CM | POA: Insufficient documentation

## 2018-06-28 DIAGNOSIS — Z7982 Long term (current) use of aspirin: Secondary | ICD-10-CM | POA: Diagnosis not present

## 2018-06-28 DIAGNOSIS — Z880 Allergy status to penicillin: Secondary | ICD-10-CM | POA: Diagnosis not present

## 2018-06-28 DIAGNOSIS — K219 Gastro-esophageal reflux disease without esophagitis: Secondary | ICD-10-CM | POA: Diagnosis not present

## 2018-06-28 DIAGNOSIS — I25119 Atherosclerotic heart disease of native coronary artery with unspecified angina pectoris: Secondary | ICD-10-CM | POA: Diagnosis not present

## 2018-06-28 DIAGNOSIS — Z8249 Family history of ischemic heart disease and other diseases of the circulatory system: Secondary | ICD-10-CM | POA: Insufficient documentation

## 2018-06-28 DIAGNOSIS — Z951 Presence of aortocoronary bypass graft: Secondary | ICD-10-CM | POA: Insufficient documentation

## 2018-06-28 DIAGNOSIS — Z87891 Personal history of nicotine dependence: Secondary | ICD-10-CM | POA: Diagnosis not present

## 2018-06-28 DIAGNOSIS — Z8619 Personal history of other infectious and parasitic diseases: Secondary | ICD-10-CM | POA: Diagnosis not present

## 2018-06-28 DIAGNOSIS — E119 Type 2 diabetes mellitus without complications: Secondary | ICD-10-CM | POA: Insufficient documentation

## 2018-06-28 DIAGNOSIS — N400 Enlarged prostate without lower urinary tract symptoms: Secondary | ICD-10-CM | POA: Insufficient documentation

## 2018-06-28 DIAGNOSIS — I10 Essential (primary) hypertension: Secondary | ICD-10-CM | POA: Diagnosis not present

## 2018-06-28 HISTORY — PX: LEFT HEART CATH AND CORONARY ANGIOGRAPHY: CATH118249

## 2018-06-28 LAB — GLUCOSE, CAPILLARY: Glucose-Capillary: 122 mg/dL — ABNORMAL HIGH (ref 70–99)

## 2018-06-28 SURGERY — LEFT HEART CATH AND CORONARY ANGIOGRAPHY
Anesthesia: Moderate Sedation | Laterality: Left

## 2018-06-28 MED ORDER — ACETAMINOPHEN 325 MG PO TABS: 650.0000 mg | ORAL_TABLET | ORAL | Status: DC | PRN

## 2018-06-28 MED ORDER — ASPIRIN 81 MG PO CHEW
CHEWABLE_TABLET | ORAL | Status: AC
  Filled 2018-06-28: qty 1

## 2018-06-28 MED ORDER — SODIUM CHLORIDE 0.9% FLUSH: 3.0000 mL | Freq: Two times a day (BID) | INTRAVENOUS | Status: DC

## 2018-06-28 MED ORDER — SODIUM CHLORIDE 0.9 % WEIGHT BASED INFUSION
3.0000 mL/kg/h | INTRAVENOUS | Status: AC
  Administered 2018-06-28: 3 mL/kg/h via INTRAVENOUS

## 2018-06-28 MED ORDER — SODIUM CHLORIDE 0.9 % IV SOLN: 250.0000 mL | INTRAVENOUS | Status: DC | PRN

## 2018-06-28 MED ORDER — HEPARIN (PORCINE) IN NACL 1000-0.9 UT/500ML-% IV SOLN
INTRAVENOUS | Status: AC
  Filled 2018-06-28: qty 1000

## 2018-06-28 MED ORDER — MIDAZOLAM HCL 2 MG/2ML IJ SOLN
INTRAMUSCULAR | Status: AC
  Filled 2018-06-28: qty 2

## 2018-06-28 MED ORDER — SODIUM CHLORIDE 0.9 % WEIGHT BASED INFUSION: 1.0000 mL/kg/h | INTRAVENOUS | Status: DC

## 2018-06-28 MED ORDER — ONDANSETRON HCL 4 MG/2ML IJ SOLN: 4.0000 mg | Freq: Four times a day (QID) | INTRAMUSCULAR | Status: DC | PRN

## 2018-06-28 MED ORDER — SODIUM CHLORIDE 0.9% FLUSH: 3.0000 mL | INTRAVENOUS | Status: DC | PRN

## 2018-06-28 MED ORDER — IOPAMIDOL (ISOVUE-300) INJECTION 61%
INTRAVENOUS | Status: DC | PRN
  Administered 2018-06-28: 90 mL via INTRA_ARTERIAL

## 2018-06-28 MED ORDER — ASPIRIN 81 MG PO CHEW
81.0000 mg | CHEWABLE_TABLET | ORAL | Status: AC
  Administered 2018-06-28: 81 mg via ORAL

## 2018-06-28 MED ORDER — FENTANYL CITRATE (PF) 100 MCG/2ML IJ SOLN
INTRAMUSCULAR | Status: AC
  Filled 2018-06-28: qty 2

## 2018-06-28 MED ORDER — FENTANYL CITRATE (PF) 100 MCG/2ML IJ SOLN
INTRAMUSCULAR | Status: DC | PRN
  Administered 2018-06-28: 25 ug via INTRAVENOUS

## 2018-06-28 MED ORDER — MIDAZOLAM HCL 2 MG/2ML IJ SOLN
INTRAMUSCULAR | Status: DC | PRN
  Administered 2018-06-28: 1 mg via INTRAVENOUS

## 2018-06-28 SURGICAL SUPPLY — 9 items
CATH INFINITI 5FR ANG PIGTAIL (CATHETERS) ×3 IMPLANT
CATH INFINITI 5FR JL4 (CATHETERS) ×3 IMPLANT
CATH INFINITI JR4 5F (CATHETERS) ×3 IMPLANT
DEVICE CLOSURE MYNXGRIP 5F (Vascular Products) ×3 IMPLANT
KIT MANI 3VAL PERCEP (MISCELLANEOUS) ×3 IMPLANT
NEEDLE PERC 18GX7CM (NEEDLE) ×3 IMPLANT
PACK CARDIAC CATH (CUSTOM PROCEDURE TRAY) ×3 IMPLANT
SHEATH AVANTI 5FR X 11CM (SHEATH) ×3 IMPLANT
WIRE GUIDERIGHT .035X150 (WIRE) ×3 IMPLANT

## 2018-06-28 NOTE — H&P (Signed)
Chief Complaint: Chief Complaint  Patient presents with  . Follow-up  Myo and Echo  Date of Service: 06/21/2018 Date of Birth: Feb 19, 1951 PCP: Benedetto Goad, MD  History of Present Illness: [name] is a 67 y.o.male patient with a past medical history significant for coronary artery disease s/p bypass in 1998 with a LIMA to the LAD and  SVG to D1,  And cath in 2006 revealing a occluded svg to D1 and significant diseae in the lcx with placement of a Cypher stent in the LCx done in 2006, essential hypertension, type 2 diabetes, and hyperlipidemia who presents to review Myoview results. For the past 1 to 2 months, patient reports increased exertional shortness of breath, especially when going up one flight of stairs. Denies any lower extremity swelling or orthopnea. Also admits to chest pain with exertion or rest. Pain is described as midsternal with no radiation but associated diaphoresis. Similar to chest pain symptoms in the past, but less severe. Denies any dizziness, lightheadedness, or syncopal episodes. On dual antiplatelet therapy of aspirin and Plavix with no significant bruising or bleeding.  We discussed the results of the nuclear stress test which revealed borderline anterior ischemia with stress with borderline reversibility. Patient agrees to proceed with cardiac catheterization and understands risks of infection/bleeding.   Past Medical and Surgical History  Past Medical History Past Medical History:  Diagnosis Date  . BPH (benign prostatic hyperplasia)  . Diabetes mellitus type 2, uncomplicated (CMS-HCC)  . GERD (gastroesophageal reflux disease)  . History of shingles  . Hyperlipidemia  . Hypertension  . Insomnia   Past Surgical History He has a past surgical history that includes Coronary artery bypass graft (1998); Cholecystectomy (2000); left eye removal (Left, 1953); Left knee surgery (1993); Colonoscopy; Upper gastrointestinal endoscopy; and Colonoscopy  (12/13/2017).   Medications and Allergies  Current Medications  Current Outpatient Medications  Medication Sig Dispense Refill  . acyclovir (ZOVIRAX) 400 MG tablet Take 1 tablet (400 mg total) by mouth 3 (three) times daily 15 tablet 1  . amitriptyline (ELAVIL) 10 MG tablet Take 3 tablets (30 mg total) by mouth nightly 270 tablet 3  . aspirin 325 MG EC tablet Take 325 mg by mouth once daily.  Marland Kitchen atenolol (TENORMIN) 50 MG tablet Take 1 tablet (50 mg total) by mouth once daily 90 tablet 3  . blood glucose diagnostic test strip Use once daily Dx E11.9 100 each 3  . blood glucose diagnostic test strip Use 1 each (1 strip total) 3 (three) times daily Use as instructed. 100 each 12  . clopidogrel (PLAVIX) 75 mg tablet Take 1 tablet (75 mg total) by mouth once daily 90 tablet 3  . empagliflozin (JARDIANCE) 25 mg Tab tablet Take 25 mg by mouth once daily 30 tablet 11  . insulin DEGLUDEC (TRESIBA FLEXTOUCH U-100) pen injector (concentration 100 units/mL) Inject 70 Units subcutaneously nightly 15 Syringe 3  . lancets Use 1 each once daily Dx E11.9 100 each 3  . lansoprazole (PREVACID) 30 MG DR capsule Take 1 capsule (30 mg total) by mouth 2 (two) times daily 180 capsule 3  . linaGLIPtin (TRADJENTA) tablet Take 1 tablet (5 mg total) by mouth once daily 90 tablet 3  . metFORMIN (GLUCOPHAGE) 500 MG tablet TAKE 3 TABLETS BY MOUTH EVERY MORNING AND 2 TABLETS EVERY EVENING. 35 tablet 1  . nitroGLYcerin (NITROSTAT) 0.4 MG SL tablet Place 1 tablet (0.4 mg total) under the tongue every 5 (five) minutes as needed for Chest pain May  take up to 3 doses. 25 tablet 1  . pen needle, diabetic 31 gauge x 3/16" needle Use once daily Dx E11.9 100 each 3  . ramipril (ALTACE) 1.25 MG capsule Take 1 capsule (1.25 mg total) by mouth once daily 90 capsule 3  . rosuvastatin (CRESTOR) 40 MG tablet Take 1 tablet (40 mg total) by mouth once daily 90 tablet 3  . sulfamethoxazole-trimethoprim (BACTRIM DS) 800-160 mg tablet TK 1 T PO  BID FOR 10 DAYS 0  . traMADol (ULTRAM) 50 mg tablet TK 1 T PO Q 6 H PRN P FOR UP TO 5 DAYS 0   No current facility-administered medications for this visit.   Allergies: Penicillins  Social and Family History  Social History reports that he quit smoking about 21 years ago. He has never used smokeless tobacco. He reports that he drinks about 3.6 oz of alcohol per week. He reports that he does not use drugs.  Family History Family History  Problem Relation Age of Onset  . Other Father  CVD  . Coronary Artery Disease (Blocked arteries around heart) Father  . Myocardial Infarction (Heart attack) Father  . Breast cancer Mother  . Alzheimer's disease Mother  . Coronary Artery Disease (Blocked arteries around heart) Mother  s/p CABG  . Coronary Artery Disease (Blocked arteries around heart) Brother  . High blood pressure (Hypertension) Brother  . Coronary Artery Disease (Blocked arteries around heart) Brother  . High blood pressure (Hypertension) Brother   Review of Systems   Review of Systems: The patient denies chest pain, shortness of breath, orthopnea, paroxysmal nocturnal dyspnea, pedal edema, palpitations, heart racing, fatigue, dizziness, lightheadedness, presyncope, syncope, leg pain, leg cramping. Review of 12 Systems is negative except as described in HPI.   Physical Examination   Vitals:BP 130/68  Pulse 65  Ht 172.7 cm (5\' 8" )  Wt 84.6 kg (186 lb 8.2 oz)  SpO2 97%  BMI 28.36 kg/m  Ht:172.7 cm (5\' 8" ) Wt:84.6 kg (186 lb 8.2 oz) RCV:ELFY surface area is 2.01 meters squared. Body mass index is 28.36 kg/m.  General: Well developed, well nourished. In no acute distress HEENT: Pupils equally reactive to light and accomodation. Strabismus  Neck: Supple without thyromegaly, or goiter. Carotid pulses 2+. No carotid bruits present.  Pulmonary: Clear to auscultation bilaterally; no wheezes, rales, rhonchi Cardiovascular: Regular rate and rhythm. No gallops, murmurs or  rubs Gastrointestinal: Soft nontender, nondistended, with normal bowel sounds Extremities: No cyanosis, clubbing, or edema Peripheral Pulses: 2+ in upper extremities, 2+ in lower extremities  Neurology: Alert and oriented X3 Pysch: Good affect. Responds appropriately  Assessment   68 y.o. male with  1. ASHD s/pCabg 98, Stent 2006, Cath 2011 good, Followed [facility] Corneilus Heggie  2. Hypertension, essential, benign  3. Mixed hyperlipidemia  4. Type 2 diabetes mellitus without complication, without long-term current use of insulin (CMS-HCC)   Plan  1. Coronary artery disease s/p bypass, PCI  -Will proceed with cardiac catheterization to evaluate abnormal stress test results  -Continue with current medication regimen of aspirin and Plavix  2. Hypertension  -Continue atenolol 50mg  daily and ramipril 1.25mg  daily  -Low sodium diet encouraged  3. Mixed hyperlipidemia  -Continue rosuvastatin 40mg  once daily  -LDL goal <70 4. Type 2 diabetes  -Continue current medication regimen; will hold metformin prior to cath   Orders Placed This Encounter  Procedures  . Basic Metabolic Panel (BMP)  . CBC w/auto Differential (5 Part)  . Basic Metabolic Panel (BMP)   Return  after cath .  [name] Brandon Fagerstrom MD  Pt seen and examined. No change from above.

## 2019-02-07 ENCOUNTER — Ambulatory Visit
Admission: EM | Admit: 2019-02-07 | Discharge: 2019-02-07 | Disposition: A | Discharge status: Home / Self Care | Payer: BLUE CROSS/BLUE SHIELD

## 2019-02-07 ENCOUNTER — Ambulatory Visit (INDEPENDENT_AMBULATORY_CARE_PROVIDER_SITE_OTHER): Payer: BLUE CROSS/BLUE SHIELD

## 2019-02-07 ENCOUNTER — Other Ambulatory Visit: Payer: Self-pay

## 2019-02-07 DIAGNOSIS — W228XXA Striking against or struck by other objects, initial encounter: Secondary | ICD-10-CM

## 2019-02-07 DIAGNOSIS — S62625A Displaced fracture of medial phalanx of left ring finger, initial encounter for closed fracture: Secondary | ICD-10-CM | POA: Diagnosis not present

## 2019-02-07 DIAGNOSIS — M79642 Pain in left hand: Secondary | ICD-10-CM

## 2019-02-07 NOTE — ED Triage Notes (Signed)
Patient complains of left ring finger pain along with pinky finger and middle finger. Patient states that he did this about 3 weeks ago when he slammed his hand in a sliding glass door.

## 2019-02-07 NOTE — Discharge Instructions (Signed)
Finger splint. Follow up with orthopedic as discussed.

## 2019-02-07 NOTE — ED Provider Notes (Signed)
[facility] ____________________________________________  Time seen: Approximately 3:54 PM  I have reviewed the triage vital signs and the nursing notes.   HISTORY  Chief Complaint Finger Injury   HPI [name] is a 68 y.o. male presenting for evaluation of injury to left fourth digit that happened 3 weeks ago.  Patient states he accidentally closed a sliding glass door against his hand.  States he has had pain to his fourth finger since, occasional pain to third and fifth digits.  States fourth digit has remained swollen since.  States initially he was able to fully bend his finger, but reports over the last 2 weeks with swelling he cannot fully bend.  Denies decreased sensation.  Denies other injury.  Denies break in skin or drainage.  Denies aggravating alleviating factors.  Reports otherwise doing well denies other complaints.  Right-hand-dominant.  Denies recent cough, congestion, fever or sickness.  Past Medical History:  Diagnosis Date   Acid reflux 04/12/2012   Arteriosclerosis of coronary artery 04/12/2012   Overview:  1.  Atherosclerotic coronary artery disease.    a.  normal LV function.  Coronary arteriography 5/98 revealed 50% LAD lesion, 95% lesion in large diagonal branch. Left circumflex was normal.  25% proximal RCA lesion.  50% lesion in the PDA.    b.  PTCA of the first diagonal and placement of three multi-link stents.    c.  recent ETT Cardiolite revealed low probability for ischemia.     d.  cardiac catheterization on 05/18/97 per [name] revealed 80% LAD, 70% proximal, 90% mid first AL, 30% RCA.     e.  status post coronary bypass grafting x 2 per [name].    f.  recurrent chest pain with exertion, equivocal ETT.    g.  repeat cardiac catheterization revealed an occluded vein graft.     h.  medical therapy.    i.  ETT Myoview showed borderline changes.     j.  cardiac catheterization 8/02 revealed no significant change in anatomy from  previous cath.     k. cardiac cath 06/19/05 revealed patent left internal mammary artery to occluded LAD. Saphenous vein to D1 was chronically occ   Benign essential HTN 11/19/2015   Benign fibroma of prostate 03/05/2014   Cannot sleep 03/05/2014   Colon polyp 04/12/2012   HLD (hyperlipidemia) 11/19/2015   Presence of artificial eye 04/12/2012   Spinal stenosis 04/12/2012   Type 2 diabetes mellitus ([place]) 03/05/2014    Patient Active Problem List   Diagnosis Date Noted   HLD (hyperlipidemia) 11/19/2015   Benign essential HTN 11/19/2015   Benign fibroma of prostate 03/05/2014   Type 2 diabetes mellitus ([place]) 03/05/2014   Cannot sleep 03/05/2014   Arteriosclerosis of coronary artery 04/12/2012   Colon polyp 04/12/2012   Presence of artificial eye 04/12/2012   Acid reflux 04/12/2012   Spinal stenosis 04/12/2012   ASHD (arteriosclerotic heart disease) 04/12/2012    Past Surgical History:  Procedure Laterality Date   COLONOSCOPY WITH PROPOFOL N/A 12/13/2017   Procedure: COLONOSCOPY WITH PROPOFOL;  Surgeon: [name], MD;  Location: [facility] ENDOSCOPY;  Service: Endoscopy;  Laterality: N/A;   CORONARY ARTERY BYPASS GRAFT  1998   CORONARY STENT PLACEMENT  2006   left eye     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   Current Outpatient Medications:    amitriptyline (ELAVIL) 10 MG tablet, Take by mouth., Disp: , Rfl:    aspirin EC 325 MG tablet, Take by mouth., Disp: , Rfl:    atenolol (TENORMIN) 50 MG tablet, Take 50 mg by mouth daily., Disp: , Rfl:    clopidogrel (PLAVIX) 75 MG tablet, Take by mouth., Disp: , Rfl:      Dulaglutide 0.75 MG/0.5ML SOPN, Inject into the skin., Disp: , Rfl:    glimepiride (AMARYL) 4 MG tablet, Take by mouth., Disp: , Rfl:    lansoprazole (PREVACID) 30 MG capsule, , Disp: , Rfl:    linagliptin (TRADJENTA) 5 MG TABS tablet, Take by mouth., Disp: , Rfl:    lisinopril (PRINIVIL,ZESTRIL) 5 MG tablet, Take 5 mg by mouth daily., Disp: , Rfl:    metFORMIN (GLUCOPHAGE) 500 MG tablet, TAKE 3 TABLETS BY MOUTH EVERY MORNING AND 2 TABLETS EVERY EVENING., Disp: , Rfl:    nitroGLYCERIN (NITROSTAT) 0.4 MG SL tablet, Nitrostat 0.4 mg sublingual tablet, Disp: , Rfl:    rosuvastatin (CRESTOR) 40 MG tablet, Take by mouth., Disp: , Rfl:    atenolol (TENORMIN) 50 MG tablet, Take 50 mg by mouth daily. , Disp: , Rfl:    eszopiclone (LUNESTA) 2 MG TABS tablet, Take 2 mg by mouth at bedtime as needed for sleep. Take immediately before bedtime, Disp: , Rfl:   Allergies Penicillins  Family History  Problem Relation Age of Onset   Nephrolithiasis Brother    Prostate cancer Neg Hx    Bladder Cancer Neg Hx    Kidney cancer Neg Hx     Social History Social History   Tobacco Use   Smoking status: Former Smoker   Smokeless tobacco: Never Used  Substance Use Topics   Alcohol use: Yes    Alcohol/week: 0.0 standard drinks   Drug use: No    Review of Systems Constitutional: No fever Cardiovascular: Denies chest pain. Respiratory: Denies shortness of breath. Gastrointestinal: No abdominal pain.   Musculoskeletal: Positive left fourth digit pain. Skin: Negative for rash. Neurological: Negative for focal weakness or numbness.   ____________________________________________   PHYSICAL EXAM:  VITAL SIGNS: ED Triage Vitals  Enc Vitals Group     BP 02/07/19 1429 128/78     Pulse Rate 02/07/19 1429 67     Resp 02/07/19 1429 16     Temp 02/07/19 1429 98.3 F (36.8 C)     Temp Source 02/07/19 1429 Oral     SpO2 02/07/19 1429 98 %     Weight 02/07/19 1425 180 lb (81.6 kg)      Height 02/07/19 1425 6' (1.829 m)     Head Circumference --      Peak Flow --      Pain Score 02/07/19 1425 7     Pain Loc --      Pain Edu? --      Excl. in [place]? --     Constitutional: Alert and oriented. Well appearing and in no acute distress. ENT      Head: Normocephalic and atraumatic. Cardiovascular: Normal rate, regular rhythm. Grossly normal heart sounds.  Good peripheral circulation. Respiratory: Normal respiratory effort without tachypnea nor retractions. Breath sounds are clear and equal bilaterally. No wheezes, rales, rhonchi. Musculoskeletal: Steady  gait.  Bilateral distal radial pulses equally palpated.  Bilateral hand grip strong. Except: Left fourth digit diffuse swelling, particularly at PIP joint with decreased flexion and extension at PIP joint, good distal left fourth digit flexion and extension, skin intact, no erythema, moderate tenderness at left fourth PIP joint, left hand otherwise nontender. Neurologic:  Normal speech and language. No gross focal neurologic deficits are appreciated. Speech is normal. No gait instability.  Skin:  Skin is warm, dry and intact. No rash noted. Psychiatric: Mood and affect are normal. Speech and behavior are normal. Patient exhibits appropriate insight and judgment   ___________________________________________   LABS (all labs ordered are listed, but only abnormal results are displayed)  Labs Reviewed - No data to display ____________________________________________  RADIOLOGY  Dg Hand Complete Left  Result Date: 02/07/2019 CLINICAL DATA:  Left middle, ring, and little finger pain since slamming his hand in a sliding glass door 3 weeks ago. EXAM: LEFT HAND - COMPLETE 3+ VIEW COMPARISON:  None. FINDINGS: Acute impacted intra-articular fracture at the base of the fourth middle phalanx with 2-3 mm volar displacement of the volar base and 6 mm dorsal displacement of the dorsal base. Surrounding soft tissue swelling. No additional  fracture. Joint spaces are preserved. Bone mineralization is normal. IMPRESSION: 1. Acute impacted and displaced intra-articular fracture of the fourth middle phalanx base. Electronically Signed   By: Titus Dubin M.D.   On: 02/07/2019 15:04   ____________________________________________   PROCEDURES Procedures   Left fourth digit finger splint applied by RN.  INITIAL IMPRESSION / ASSESSMENT AND PLAN / ED COURSE  Pertinent labs & imaging results that were available during my care of the patient were reviewed by me and considered in my medical decision making (see chart for details).  Well-appearing patient.  Left fourth digit pain post mechanical injury.  X-ray as above per radiologist, impacted and displaced intra-articular fracture of the fourth middle phalanx base.  Finger splint given.  Discussed following up with orthopedic, patient information for [facility] given, and patient states [facility] will [facility] to [facility] to schedule follow-up.  Ice and supportive care.  Discussed follow up with Primary care physician this week as needed. Discussed follow up and return parameters including no resolution or any worsening concerns. Patient verbalized understanding and agreed to plan.   ____________________________________________   FINAL CLINICAL IMPRESSION(S) / ED DIAGNOSES  Final diagnoses:  Displaced fracture of middle phalanx of left ring finger, initial encounter for closed fracture     ED Discharge Orders    None       Note: This dictation was prepared with Dragon dictation along with smaller phrase technology. Any transcriptional errors that result from this process are unintentional.         Marylene Land, NP 02/07/19 2024

## 2019-07-25 DIAGNOSIS — Z96649 Presence of unspecified artificial hip joint: Secondary | ICD-10-CM | POA: Insufficient documentation

## 2019-09-15 IMAGING — DX DG FINGER THUMB 2+V*L*
3 series · 3 of 3 positions shown · non-contrast
Comparison: None.

CLINICAL DATA: Laceration to the distal thumb

EXAM:
LEFT THUMB 2+V

[finger ap]
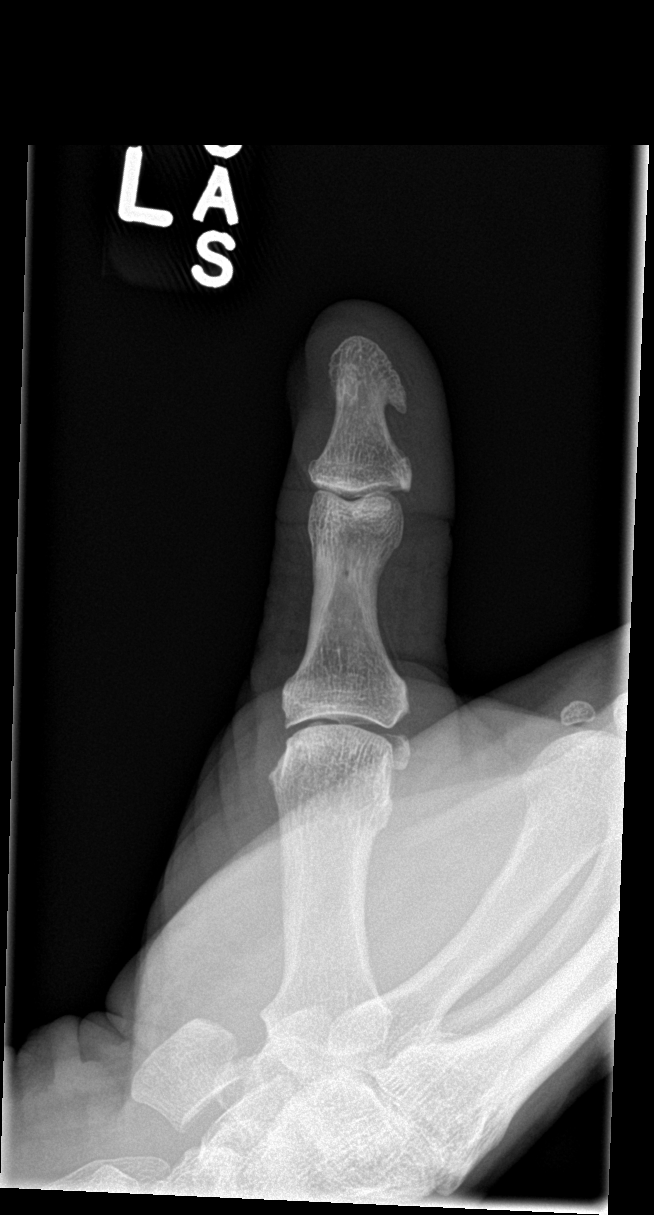

[finger obl]
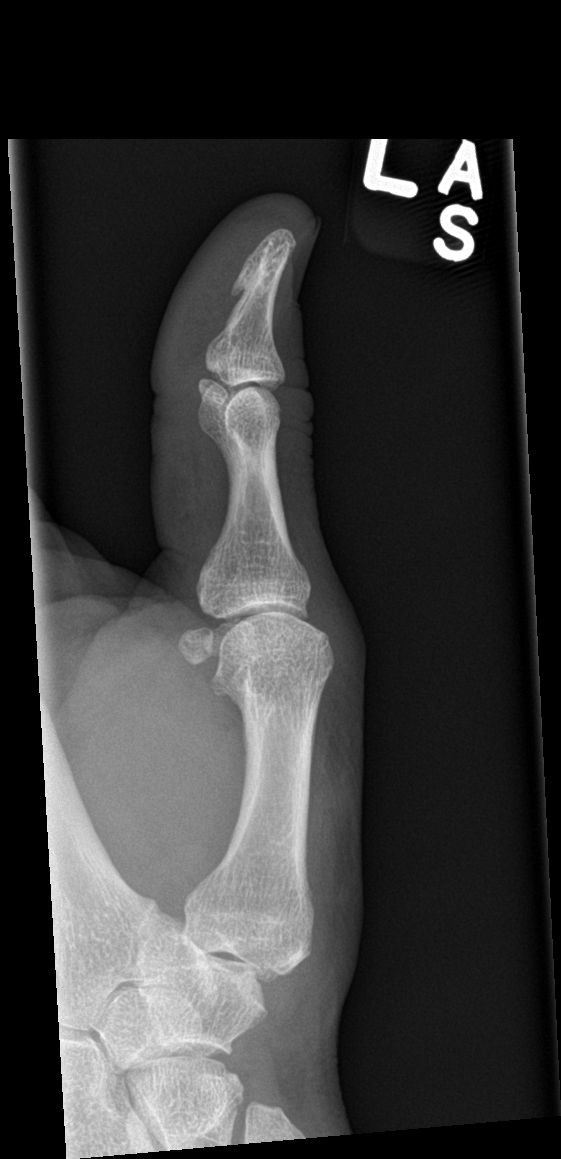

[finger lat]
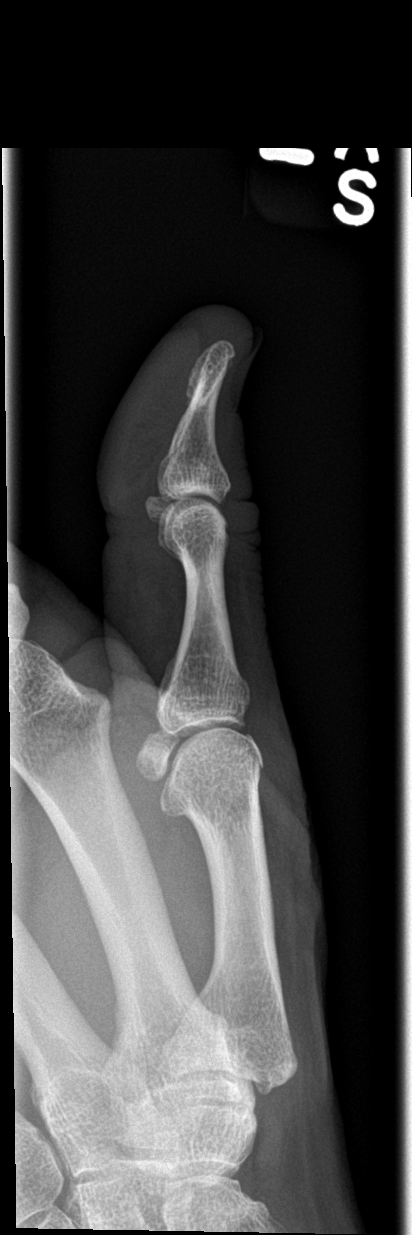

[3 of 3 positions shown; findings below may reference images not displayed]

FINDINGS: No fracture or malalignment. No radiopaque foreign body in the soft
tissues.
IMPRESSION: No acute osseous abnormality.

## 2019-11-06 LAB — HEMOGLOBIN A1C: Hemoglobin A1C: 7.2

## 2019-12-31 ENCOUNTER — Encounter: Payer: Self-pay | Admitting: Nurse Practitioner

## 2020-01-05 ENCOUNTER — Other Ambulatory Visit: Payer: Self-pay

## 2020-01-05 ENCOUNTER — Ambulatory Visit (INDEPENDENT_AMBULATORY_CARE_PROVIDER_SITE_OTHER): Payer: Medicare Other | Admitting: Nurse Practitioner

## 2020-01-05 ENCOUNTER — Encounter: Payer: Self-pay | Admitting: Nurse Practitioner

## 2020-01-05 VITALS — BP 138/61 | HR 65 | Temp 98.5°F | Ht 70.0 in | Wt 168.8 lb

## 2020-01-05 DIAGNOSIS — N529 Male erectile dysfunction, unspecified: Secondary | ICD-10-CM | POA: Insufficient documentation

## 2020-01-05 DIAGNOSIS — Z7689 Persons encountering health services in other specified circumstances: Secondary | ICD-10-CM | POA: Diagnosis not present

## 2020-01-05 DIAGNOSIS — E1169 Type 2 diabetes mellitus with other specified complication: Secondary | ICD-10-CM | POA: Diagnosis not present

## 2020-01-05 DIAGNOSIS — F5101 Primary insomnia: Secondary | ICD-10-CM

## 2020-01-05 DIAGNOSIS — E1159 Type 2 diabetes mellitus with other circulatory complications: Secondary | ICD-10-CM

## 2020-01-05 DIAGNOSIS — E785 Hyperlipidemia, unspecified: Secondary | ICD-10-CM

## 2020-01-05 DIAGNOSIS — I10 Essential (primary) hypertension: Secondary | ICD-10-CM

## 2020-01-05 DIAGNOSIS — N521 Erectile dysfunction due to diseases classified elsewhere: Secondary | ICD-10-CM

## 2020-01-05 DIAGNOSIS — Z794 Long term (current) use of insulin: Secondary | ICD-10-CM

## 2020-01-05 DIAGNOSIS — E1165 Type 2 diabetes mellitus with hyperglycemia: Secondary | ICD-10-CM

## 2020-01-05 DIAGNOSIS — I2510 Atherosclerotic heart disease of native coronary artery without angina pectoris: Secondary | ICD-10-CM

## 2020-01-05 MED ORDER — AMITRIPTYLINE HCL 10 MG PO TABS: 20.0000 mg | ORAL_TABLET | Freq: Every day | ORAL | 3 refills | Status: DC

## 2020-01-05 MED ORDER — ROSUVASTATIN CALCIUM 40 MG PO TABS: 40.0000 mg | ORAL_TABLET | Freq: Every day | ORAL | 3 refills | Status: DC

## 2020-01-05 NOTE — Assessment & Plan Note (Signed)
Chronic, ongoing.  Would benefit from ongoing discontinuation of Ambien due to age >11.  Is tolerating Amitriptyline.  Will continue this regimen at 20 MG, script sent in.  In future if ongoing issues, could consider Belsomra.  Continue to monitor and adjust regimen as needed.  Recommend sleep hygiene techniques.

## 2020-01-05 NOTE — Assessment & Plan Note (Signed)
Chronic, stable.  Continue collaboration with cardiology.  Dual anti-platelet recommended to continue per cardiology.  Notes reviewed.

## 2020-01-05 NOTE — Assessment & Plan Note (Signed)
Chronic, ongoing with BP slightly above goal on SBP today.  Goal <130/80.  Will continue current medication regimen and cardiology collaboration.  Recommend he monitor BP at home at least a few mornings a week and document for provider.  Return in June and will obtain BMP at visit.

## 2020-01-05 NOTE — Assessment & Plan Note (Signed)
Ongoing, most likely secondary to diabetes and cardiac history + medications.  He will discuss ED medications with cardiologist and if okay then will prescribe Cialis or Viagra as needed next visit. 

## 2020-01-05 NOTE — Patient Instructions (Signed)

## 2020-01-05 NOTE — Assessment & Plan Note (Addendum)
Chronic, ongoing.  Continue Crestor, will send in refills on this to ensure adherence.  Adjust dose as needed.  Return in June and will check lipid panel.

## 2020-01-05 NOTE — Progress Notes (Signed)
New Patient Office Visit  Subjective:  Patient ID: [name], male    DOB: [birth year]  Age: 69 y.o. MRN: [id number]  CC:  Chief Complaint  Patient presents with  . Establish Care    HPI [name] presents for new patient visit to establish care.  Introduced to Designer, jewellery role and practice setting.  All questions answered.  His previous provider left and went to [place], is looking for change.  DIABETES Followed by endocrinology and last saw Dr. Honor Junes on 11/06/19 with A1C 7.2%.  Was diagnosed 6-7 years ago.  He continues on Metformin 1500 MG in morning and 1000 MG in evening and Tresiba 50 units daily.  Trulicity was discontinued due to chronic diarrhea, which GI felt was related to medication, has been much better with no further N&V since stopping.  In past has taken Glimepiride, Jardiance (he does not know why they stopped), and Tradjenta.  Has concerns about his insulin which is now $900 a month due to going part-time.   Hypoglycemic episodes:no Polydipsia/polyuria: no Visual disturbance: no Chest pain: no Paresthesias: no Glucose Monitoring: yes  Accucheck frequency: Daily  Fasting glucose: 132 this morning -- on average 130-140  Post prandial:  Evening:  Before meals: Taking Insulin?: yes  Long acting insulin: Tresiba 50 units  Short acting insulin: Blood Pressure Monitoring: not checking Retinal Examination: Not up to Date Foot Exam: Not up to Date Pneumovax: Up to Date Influenza: Up to Date Aspirin: yes   HYPERTENSION / HYPERLIPIDEMIA Followed by cardiology and history of CABG in 1998, stent in 2006, catheterization in 2011.  Last saw Dr. Ubaldo Glassing with cardiology on 10/10/2018, per this note to continue dual anti-platelet therapy.  Continues on Ramipril 1.25 MG, Atenolol 50 MG, ASA, and Plavix.  Last echo in 2019 and 55% .  Also has NTG for angina pain, has not used in a long time.  No current statin due to he did not realize it was missing, but  with conversation he realizes he has not been taking.  He inquired into medication for ED, reports he has difficult achieving an erection, has been ongoing for long while. Reports he will talk to cardiology and if they are okay with ED medication will order. Satisfied with current treatment? yes Duration of hypertension: chronic BP monitoring frequency: not checking BP range:  BP medication side effects: no Duration of hyperlipidemia: chronic Cholesterol medication side effects: no Cholesterol supplements: none Medication compliance: good compliance Aspirin: yes Recent stressors: no Recurrent headaches: no Visual changes: no Palpitations: no Dyspnea: no Chest pain: no Lower extremity edema: no Dizzy/lightheaded: no   INSOMNIA Was on Ambien for 10 years and was taken off of this due to age.  Saw psychiatrist in January, was put back on it, then in February was renewed through month of May.  He reports running out of this and then taking some old Amitriptyline 20 MG at night, which was offering benefit.  He has tried multiple other things in past without benefit per his report.  Discussed BEERS criteria with him. Duration: chronic Satisfied with sleep quality: no Difficulty falling asleep: no Difficulty staying asleep: yes Waking a few hours after sleep onset: yes Early morning awakenings: no Daytime hypersomnolence: no Wakes feeling refreshed: yes Good sleep hygiene: yes Apnea: no Snoring: no Depressed/anxious mood: no Recent stress: no Restless legs/nocturnal leg cramps: no Chronic pain/arthritis: no History of sleep study: no Treatments attempted: [place]    Past Medical History:  Diagnosis Date  .  Acid reflux 04/12/2012  . Arteriosclerosis of coronary artery 04/12/2012   Overview:  1.  Atherosclerotic coronary artery disease.    a.  normal LV function.  Coronary arteriography 5/98 revealed 50% LAD lesion, 95% lesion in large diagonal branch. Left circumflex was normal.   25% proximal RCA lesion.  50% lesion in the PDA.    b.  PTCA of the first diagonal and placement of three multi-link stents.    c.  recent ETT Cardiolite revealed low probability for ischemia.     d.  cardiac catheterization on 05/18/97 per [name] revealed 80% LAD, 70% proximal, 90% mid first AL, 30% RCA.     e.  status post coronary bypass grafting x 2 per [name].    f.  recurrent chest pain with exertion, equivocal ETT.    g.  repeat cardiac catheterization revealed an occluded vein graft.     h.  medical therapy.    i.  ETT Myoview showed borderline changes.     j.  cardiac catheterization 8/02 revealed no significant change in anatomy from previous cath.     k. cardiac cath 06/19/05 revealed patent left internal mammary artery to occluded LAD. Saphenous vein to D1 was chronically occ  . Benign essential HTN 11/19/2015  . Benign fibroma of prostate 03/05/2014  . Cannot sleep 03/05/2014  . Colon polyp 04/12/2012  . HLD (hyperlipidemia) 11/19/2015  . Presence of artificial eye 04/12/2012  . Spinal stenosis 04/12/2012  . Type 2 diabetes mellitus ([place]) 03/05/2014    Past Surgical History:  Procedure Laterality Date  . COLONOSCOPY WITH PROPOFOL N/A 12/13/2017   Procedure: COLONOSCOPY WITH PROPOFOL;  Surgeon: [name], MD;  [facility];  Service: Endoscopy;  Laterality: N/A;  . CORONARY ARTERY BYPASS GRAFT  1998  . CORONARY STENT PLACEMENT  2006  . JOINT REPLACEMENT Left    hip- 06/2019  . 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 LEFT HEART CATH AND CORONARY ANGIOGRAPHY Left 06/28/2018   Procedure: LEFT HEART CATH AND CORONARY ANGIOGRAPHY;  Surgeon: [name], MD;  Location: [facility];  Service: Cardiovascular;  Laterality: Left;  . lt knee cap     had to have lt knee cap replaced    Family History  Problem Relation Age of Onset  . Hypertension Brother   . Diabetes Brother   . Heart disease Mother   . Heart disease Father   .  Diabetes Daughter   . Schizophrenia Maternal Grandmother   . Heart disease Brother   . Prostate cancer Neg Hx   . Bladder Cancer Neg Hx   . Kidney cancer Neg Hx     Social History   Socioeconomic History  . Marital status: Married    Spouse name: Not on file  . Number of children: Not on file  . Years of education: Not on file  . Highest education level: Not on file  Occupational History  . Not on file  Tobacco Use  . Smoking status: Former Smoker    Quit date: 1998    Years since quitting: 23.3  . Smokeless tobacco: Never Used  Substance and Sexual Activity  . Alcohol use: Yes    Alcohol/week: 6.0 standard drinks    Types: 6 Cans of beer per week  . Drug use: No  . Sexual activity: Not on file  Other Topics Concern  . Not on file  Social History Narrative  .  Not on file   Social Determinants of Health   Financial Resource Strain: Low Risk   . Difficulty of Paying Living Expenses: Not hard at all  Food Insecurity: No Food Insecurity  . Worried About Charity fundraiser in the Last Year: Never true  . Ran Out of Food in the Last Year: Never true  Transportation Needs: No Transportation Needs  . Lack of Transportation (Medical): No  . Lack of Transportation (Non-Medical): No  Physical Activity: Insufficiently Active  . Days of Exercise per Week: 3 days  . Minutes of Exercise per Session: 30 min  Stress: No Stress Concern Present  . Feeling of Stress : Only a little  Social Connections: Not Isolated  . Frequency of Communication with Friends and Family: Three times a week  . Frequency of Social Gatherings with Friends and Family: Three times a week  . Attends Religious Services: More than 4 times per year  . Active Member of Clubs or Organizations: Yes  . Attends Archivist Meetings: Never  . Marital Status: Married  Human resources officer Violence:   . Fear of Current or Ex-Partner:   . Emotionally Abused:   Marland Kitchen Physically Abused:   . Sexually Abused:      ROS Review of Systems  Constitutional: Negative for activity change, diaphoresis, fatigue and fever.  Respiratory: Negative for cough, chest tightness, shortness of breath and wheezing.   Cardiovascular: Negative for chest pain, palpitations and leg swelling.  Gastrointestinal: Negative.   Endocrine: Negative for polydipsia, polyphagia and polyuria.  Neurological: Negative.   Psychiatric/Behavioral: Negative.     Objective:   Today's Vitals: BP 138/61   Pulse 65   Temp 98.5 F (36.9 C) (Oral)   Ht 5\' 10"  (1.778 m)   Wt 168 lb 12.8 oz (76.6 kg)   SpO2 99%   BMI 24.22 kg/m   Physical Exam Vitals and nursing note reviewed.  Constitutional:      General: He is awake. He is not in acute distress.    Appearance: He is well-developed and well-groomed. He is not ill-appearing.  HENT:     Head: Normocephalic and atraumatic.     Right Ear: Hearing normal. No drainage.     Left Ear: Hearing normal. No drainage.  Eyes:     General: Lids are normal.        Right eye: No discharge.        Left eye: No discharge.     Conjunctiva/sclera: Conjunctivae normal.     Pupils: Pupils are equal, round, and reactive to light.  Neck:     Thyroid: No thyromegaly.     Vascular: No carotid bruit.  Cardiovascular:     Rate and Rhythm: Normal rate and regular rhythm.     Heart sounds: Normal heart sounds, S1 normal and S2 normal. No murmur. No gallop.   Pulmonary:     Effort: Pulmonary effort is normal. No accessory muscle usage or respiratory distress.     Breath sounds: Normal breath sounds.  Abdominal:     General: Bowel sounds are normal.     Palpations: Abdomen is soft.  Musculoskeletal:        General: Normal range of motion.     Cervical back: Normal range of motion and neck supple.     Right lower leg: No edema.     Left lower leg: No edema.  Lymphadenopathy:     Cervical: No cervical adenopathy.  Skin:    General: Skin is warm  and dry.     Capillary Refill: Capillary refill  takes less than 2 seconds.  Neurological:     Mental Status: He is alert and oriented to person, place, and time.     Deep Tendon Reflexes: Reflexes are normal and symmetric.     Reflex Scores:      Brachioradialis reflexes are 2+ on the right side and 2+ on the left side.      Patellar reflexes are 2+ on the right side and 2+ on the left side. Psychiatric:        Attention and Perception: Attention normal.        Mood and Affect: Mood normal.        Speech: Speech normal.        Behavior: Behavior normal. Behavior is cooperative.        Thought Content: Thought content normal.     Assessment & Plan:   Problem List Items Addressed This Visit      Cardiovascular and Mediastinum   ASHD (arteriosclerotic heart disease)    Chronic, stable.  Continue collaboration with cardiology.  Dual anti-platelet recommended to continue per cardiology.  Notes reviewed.      Relevant Medications   ramipril (ALTACE) 1.25 MG capsule   rosuvastatin (CRESTOR) 40 MG tablet   Hypertension associated with type 2 diabetes mellitus (HCC)    Chronic, ongoing with BP slightly above goal on SBP today.  Goal <130/80.  Will continue current medication regimen and cardiology collaboration.  Recommend he monitor BP at home at least a few mornings a week and document for provider.  Return in June and will obtain BMP at visit.      Relevant Medications   ramipril (ALTACE) 1.25 MG capsule   insulin degludec (TRESIBA FLEXTOUCH) 100 UNIT/ML FlexTouch Pen   rosuvastatin (CRESTOR) 40 MG tablet     Endocrine   Hyperlipidemia associated with type 2 diabetes mellitus (HCC)    Chronic, ongoing.  Continue Crestor, will send in refills on this to ensure adherence.  Adjust dose as needed.  Return in June and will check lipid panel.      Relevant Medications   ramipril (ALTACE) 1.25 MG capsule   insulin degludec (TRESIBA FLEXTOUCH) 100 UNIT/ML FlexTouch Pen   rosuvastatin (CRESTOR) 40 MG tablet   Type 2 diabetes  mellitus with hyperglycemia, with long-term current use of insulin (HCC)    Chronic, ongoing.  Followed by endocrinology with recent A1C 7.2%.  Continue current medication regimen as prescribed by endocrinology, notes reviewed.  CCM referral for assistance with [place] costs.  Will plan on follow-up in June after next endocrinology visit to obtain urine micro.  Would benefit from [place] in future due to cardiac history.      Relevant Medications   ramipril (ALTACE) 1.25 MG capsule   insulin degludec (TRESIBA FLEXTOUCH) 100 UNIT/ML FlexTouch Pen   rosuvastatin (CRESTOR) 40 MG tablet   Other Relevant Orders   Referral to Chronic Care Management Services     Other   Insomnia    Chronic, ongoing.  Would benefit from ongoing discontinuation of Ambien due to age >21.  Is tolerating Amitriptyline.  Will continue this regimen at 20 MG, script sent in.  In future if ongoing issues, could consider Belsomra.  Continue to monitor and adjust regimen as needed.  Recommend sleep hygiene techniques.      Erectile dysfunction    Ongoing, most likely secondary to diabetes and cardiac history + medications.  He will discuss ED medications  with cardiologist and if okay then will prescribe Cialis or Viagra as needed next visit.       Other Visit Diagnoses    Encounter to establish care    -  Primary      Outpatient Encounter Medications as of 01/05/2020  Medication Sig  . amitriptyline (ELAVIL) 10 MG tablet Take 2 tablets (20 mg total) by mouth at bedtime.  Marland Kitchen aspirin EC 325 MG tablet Take 325 mg by mouth daily.   Marland Kitchen atenolol (TENORMIN) 50 MG tablet Take 50 mg by mouth daily.  . clopidogrel (PLAVIX) 75 MG tablet Take 75 mg by mouth daily.   . insulin degludec (TRESIBA FLEXTOUCH) 100 UNIT/ML FlexTouch Pen Inject 50 Units into the skin daily.  . lansoprazole (PREVACID) 30 MG capsule Take 30 mg by mouth 2 (two) times daily before a meal.   . metFORMIN (GLUCOPHAGE) 500 MG tablet TAKE 3 TABLETS BY MOUTH  EVERY MORNING AND 2 TABLETS EVERY EVENING.  . nitroGLYCERIN (NITROSTAT) 0.4 MG SL tablet Place 0.4 mg under the tongue every 5 (five) minutes as needed.   . ramipril (ALTACE) 1.25 MG capsule Take 1.25 mg by mouth daily.  Marland Kitchen UNABLE TO FIND Glucofix  . [DISCONTINUED] amitriptyline (ELAVIL) 10 MG tablet Take 20 mg by mouth at bedtime.   . rosuvastatin (CRESTOR) 40 MG tablet Take 1 tablet (40 mg total) by mouth daily.  . [DISCONTINUED] atenolol (TENORMIN) 50 MG tablet Take 50 mg by mouth daily.   . [DISCONTINUED] Dulaglutide 0.75 MG/0.5ML SOPN Inject into the skin.  . [DISCONTINUED] eszopiclone (LUNESTA) 2 MG TABS tablet Take 2 mg by mouth at bedtime as needed for sleep. Take immediately before bedtime  . [DISCONTINUED] glimepiride (AMARYL) 4 MG tablet Take by mouth.  . [DISCONTINUED] linagliptin (TRADJENTA) 5 MG TABS tablet Take by mouth.  . [DISCONTINUED] lisinopril (PRINIVIL,ZESTRIL) 5 MG tablet Take 5 mg by mouth daily.  . [DISCONTINUED] rosuvastatin (CRESTOR) 40 MG tablet Take by mouth.   No facility-administered encounter medications on file as of 01/05/2020.    Follow-up: Return in about 7 weeks (around 02/21/2020) for T2DM, HTN/HLD, ED.   [name], NP

## 2020-01-05 NOTE — Assessment & Plan Note (Signed)
Chronic, ongoing.  Followed by endocrinology with recent A1C 7.2%.  Continue current medication regimen as prescribed by endocrinology, notes reviewed.  CCM referral for assistance with [place] costs.  Will plan on follow-up in June after next endocrinology visit to obtain urine micro.  Would benefit from [place] in future due to cardiac history.

## 2020-01-08 ENCOUNTER — Telehealth: Payer: Self-pay | Admitting: Nurse Practitioner

## 2020-01-08 NOTE — Chronic Care Management (AMB) (Signed)
  Chronic Care Management   Note  01/08/2020 Name: Brockton Mckesson MRN: [id number] DOB: 01-27-1951  [name] is a 69 y.o. year old male who is a primary care patient of Cannady, Barbaraann Faster, NP. I reached out to [name] by phone today in response to a referral sent by Mr. Jaziah Goeller Bara's PCP, [name] NP     Mr. Ladnier was given information about Chronic Care Management services today including:  1. CCM service includes personalized support from designated clinical staff supervised by his physician, including individualized plan of care and coordination with other care providers 2. 24/7 contact phone numbers for assistance for urgent and routine care needs. 3. Service will only be billed when office clinical staff spend 20 minutes or more in a month to coordinate care. 4. Only one practitioner may furnish and bill the service in a calendar month. 5. The patient may stop CCM services at any time (effective at the end of the month) by phone call to the office staff. 6. The patient will be responsible for cost sharing (co-pay) of up to 20% of the service fee (after annual deductible is met).  Patient agreed to services and verbal consent obtained.   Follow up plan: Telephone appointment with care management team member scheduled for:02/13/2020  Glenna Durand, LPN Health Advisor, [facility] ??[email or web address] ??[phone]

## 2020-01-08 NOTE — Chronic Care Management (AMB) (Signed)
  Chronic Care Management   Outreach Note  01/08/2020 Name: [name] MRN: [id number] DOB: 1951/07/08  [name] is a 69 y.o. year old male who is a primary care patient of Cannady, Barbaraann Faster, NP. I reached out to [name] by phone today in response to a referral sent by [name]'s PCP, [name] NP     An unsuccessful telephone outreach was attempted today. The patient was referred to the case management team for assistance with care management and care coordination.   Follow Up Plan: A HIPPA compliant phone message was left for the patient providing contact information and requesting a return call.  The care management team will reach out to the patient again over the next 7 days.  If patient returns call to provider office, please advise to call Embedded Care Management Care Guide Glenna Durand LPN at [phone]  Caralee Morea, LPN Health Advisor, [facility] Management ??[email or web address] ??[phone]

## 2020-01-15 ENCOUNTER — Other Ambulatory Visit: Payer: Self-pay | Admitting: Nurse Practitioner

## 2020-01-15 MED ORDER — LANSOPRAZOLE 30 MG PO CPDR: 30.0000 mg | DELAYED_RELEASE_CAPSULE | Freq: Two times a day (BID) | ORAL | 4 refills | Status: DC

## 2020-01-15 NOTE — Telephone Encounter (Signed)
Routing to provider  

## 2020-01-15 NOTE — Telephone Encounter (Signed)
Medication Refill - Medication: lansoprazole   Has the patient contacted their pharmacy? Yes.   Pt states that he is completely out of this medication. Please advise.  (Agent: If no, request that the patient contact the pharmacy for the refill.) (Agent: If yes, when and what did the pharmacy advise?)  Preferred Pharmacy (with phone number or street name):  [facility] [address], [address]  [place] [address]  Phone: [phone] Fax: [phone]  Not a 24 hour pharmacy; exact hours not known.     Agent: Please be advised that RX refills may take up to 3 business days. We ask that you follow-up with your pharmacy.

## 2020-01-22 ENCOUNTER — Other Ambulatory Visit: Payer: Self-pay | Admitting: Nurse Practitioner

## 2020-01-22 ENCOUNTER — Telehealth: Payer: Self-pay

## 2020-01-22 MED ORDER — PANTOPRAZOLE SODIUM 40 MG PO TBEC: 40.0000 mg | DELAYED_RELEASE_TABLET | Freq: Every day | ORAL | 3 refills | Status: DC

## 2020-01-22 NOTE — Telephone Encounter (Signed)
Patient notified and verbalized understanding. 

## 2020-01-22 NOTE — Telephone Encounter (Signed)
LVM for patient to return phone call.  

## 2020-01-22 NOTE — Progress Notes (Signed)
Change from Prevacid to Pantoprazole due to insurance.

## 2020-01-22 NOTE — Telephone Encounter (Signed)
Received PA for patient for Lansoprazole. Unable to complete PA because I don't have documentation regarding GERD. They also asked if patient could take 60 mg once daily instead of 30 mg twice daily.  Need reasoning as to why if patient can't take 60 mg once daily.   Routing to provider to see if she know further information regarding patient's Lansoprazole.

## 2020-01-22 NOTE — Telephone Encounter (Signed)
He is new patient -- can we see if they will cover alternate to this and then I can discuss with patient if so.  Prilosec? Protonix?

## 2020-01-22 NOTE — Telephone Encounter (Signed)
Here are tier 1 medications [employer] Medicare Formulary Guide   Pantoprazole 20mg  EC tablet with quantity limit of 30 tabs/30 days Pantoprazole 40mg  EC tablet with quantity limit of 60 tabs/30 days  Omeprazole 10mg  delayed release caps with quantity limit of 30 caps/30 days Omeprazole 20mg  and 40mg  delayed capsules with quantity limit of 60 caps/30 days.  Brand names like Protonix or Prilosec are not covered medications.

## 2020-01-22 NOTE — Telephone Encounter (Signed)
Please let patient know I have changed his Prevacid to Pantoprazole due to insurance and then at his 02/23/2020 visit we will discuss his heart burn.  Sent in Pantoprazole script.

## 2020-02-10 ENCOUNTER — Emergency Department: Payer: Medicare Other

## 2020-02-10 ENCOUNTER — Emergency Department
Admission: EM | Admit: 2020-02-10 | Discharge: 2020-02-10 | Disposition: A | Discharge status: Home / Self Care | Payer: Medicare Other | Attending: Emergency Medicine | Admitting: Emergency Medicine

## 2020-02-10 ENCOUNTER — Other Ambulatory Visit: Payer: Self-pay

## 2020-02-10 DIAGNOSIS — Z20822 Contact with and (suspected) exposure to covid-19: Secondary | ICD-10-CM | POA: Diagnosis not present

## 2020-02-10 DIAGNOSIS — I10 Essential (primary) hypertension: Secondary | ICD-10-CM | POA: Diagnosis not present

## 2020-02-10 DIAGNOSIS — J189 Pneumonia, unspecified organism: Secondary | ICD-10-CM | POA: Diagnosis not present

## 2020-02-10 DIAGNOSIS — Z96642 Presence of left artificial hip joint: Secondary | ICD-10-CM | POA: Diagnosis not present

## 2020-02-10 DIAGNOSIS — I2510 Atherosclerotic heart disease of native coronary artery without angina pectoris: Secondary | ICD-10-CM | POA: Diagnosis not present

## 2020-02-10 DIAGNOSIS — Z87891 Personal history of nicotine dependence: Secondary | ICD-10-CM | POA: Insufficient documentation

## 2020-02-10 DIAGNOSIS — Z794 Long term (current) use of insulin: Secondary | ICD-10-CM | POA: Diagnosis not present

## 2020-02-10 DIAGNOSIS — E119 Type 2 diabetes mellitus without complications: Secondary | ICD-10-CM | POA: Insufficient documentation

## 2020-02-10 DIAGNOSIS — Z79899 Other long term (current) drug therapy: Secondary | ICD-10-CM | POA: Insufficient documentation

## 2020-02-10 DIAGNOSIS — E860 Dehydration: Secondary | ICD-10-CM | POA: Insufficient documentation

## 2020-02-10 DIAGNOSIS — R05 Cough: Secondary | ICD-10-CM | POA: Diagnosis present

## 2020-02-10 LAB — BASIC METABOLIC PANEL
Anion gap: 16 — ABNORMAL HIGH (ref 5–15)
Anion gap: 12 (ref 5–15)
BUN: 34 mg/dL — ABNORMAL HIGH (ref 8–23)
BUN: 34 mg/dL — ABNORMAL HIGH (ref 8–23)
CO2: 24 mmol/L (ref 22–32)
CO2: 21 mmol/L — ABNORMAL LOW (ref 22–32)
Calcium: 9.4 mg/dL (ref 8.9–10.3)
Calcium: 8.3 mg/dL — ABNORMAL LOW (ref 8.9–10.3)
Chloride: 98 mmol/L (ref 98–111)
Chloride: 105 mmol/L (ref 98–111)
Creatinine, Ser: 1.83 mg/dL — ABNORMAL HIGH (ref 0.61–1.24)
Creatinine, Ser: 1.66 mg/dL — ABNORMAL HIGH (ref 0.61–1.24)
GFR calc Af Amer: 42 mL/min — ABNORMAL LOW (ref >60)
GFR calc Af Amer: 47 mL/min — ABNORMAL LOW (ref >60)
GFR calc non Af Amer: 36 mL/min — ABNORMAL LOW (ref >60)
GFR calc non Af Amer: 41 mL/min — ABNORMAL LOW (ref >60)
Glucose, Bld: 149 mg/dL — ABNORMAL HIGH (ref 70–99)
Glucose, Bld: 156 mg/dL — ABNORMAL HIGH (ref 70–99)
Potassium: 3.9 mmol/L (ref 3.5–5.1)
Potassium: 3.8 mmol/L (ref 3.5–5.1)
Sodium: 138 mmol/L (ref 135–145)
Sodium: 138 mmol/L (ref 135–145)

## 2020-02-10 LAB — CBC WITH DIFFERENTIAL/PLATELET
Abs Immature Granulocytes: 0.08 10*3/uL — ABNORMAL HIGH (ref 0.00–0.07)
Basophils Absolute: 0 10*3/uL (ref 0.0–0.1)
Basophils Relative: 0 %
Eosinophils Absolute: 0.1 10*3/uL (ref 0.0–0.5)
Eosinophils Relative: 2 %
HCT: 37.5 % — ABNORMAL LOW (ref 39.0–52.0)
Hemoglobin: 12.9 g/dL — ABNORMAL LOW (ref 13.0–17.0)
Immature Granulocytes: 1 %
Lymphocytes Relative: 7 %
Lymphs Abs: 0.6 10*3/uL — ABNORMAL LOW (ref 0.7–4.0)
MCH: 31.1 pg (ref 26.0–34.0)
MCHC: 34.4 g/dL (ref 30.0–36.0)
MCV: 90.4 fL (ref 80.0–100.0)
Monocytes Absolute: 0.8 10*3/uL (ref 0.1–1.0)
Monocytes Relative: 9 %
Neutro Abs: 6.7 10*3/uL (ref 1.7–7.7)
Neutrophils Relative %: 81 %
Platelets: 215 10*3/uL (ref 150–400)
RBC: 4.15 MIL/uL — ABNORMAL LOW (ref 4.22–5.81)
RDW: 13.7 % (ref 11.5–15.5)
WBC: 8.3 10*3/uL (ref 4.0–10.5)
nRBC: 0 % (ref 0.0–0.2)

## 2020-02-10 LAB — SARS CORONAVIRUS 2 BY RT PCR (HOSPITAL ORDER, PERFORMED IN ~~LOC~~ HOSPITAL LAB): SARS Coronavirus 2: NEGATIVE

## 2020-02-10 LAB — LACTIC ACID, PLASMA
Lactic Acid, Venous: 2.3 mmol/L (ref 0.5–1.9)
Lactic Acid, Venous: 1.5 mmol/L (ref 0.5–1.9)

## 2020-02-10 MED ORDER — HYDROCOD POLST-CPM POLST ER 10-8 MG/5ML PO SUER: 5.0000 mL | Freq: Two times a day (BID) | ORAL | 0 refills | Status: DC | PRN

## 2020-02-10 MED ORDER — AZITHROMYCIN 250 MG PO TABS
ORAL_TABLET | ORAL | 0 refills | Status: DC
Start: 2020-02-10 — End: 2020-02-28

## 2020-02-10 MED ORDER — SODIUM CHLORIDE 0.9 % IV SOLN
500.0000 mg | Freq: Once | INTRAVENOUS | Status: AC
  Administered 2020-02-10: 500 mg via INTRAVENOUS
  Filled 2020-02-10: qty 500

## 2020-02-10 MED ORDER — SODIUM CHLORIDE 0.9 % IV BOLUS
1000.0000 mL | Freq: Once | INTRAVENOUS | Status: AC
  Administered 2020-02-10: 1000 mL via INTRAVENOUS

## 2020-02-10 MED ORDER — DEXAMETHASONE SODIUM PHOSPHATE 10 MG/ML IJ SOLN
10.0000 mg | Freq: Once | INTRAMUSCULAR | Status: AC
  Administered 2020-02-10: 10 mg via INTRAVENOUS
  Filled 2020-02-10: qty 1

## 2020-02-10 NOTE — Discharge Instructions (Signed)
Please schedule follow-up appointment with your primary care provider in about a week for a recheck.  Return to the emergency department for any concerns if you are unable to see primary care.

## 2020-02-10 NOTE — ED Notes (Signed)
Difficulty obtaining 2nd blood cultures. One set is sufficient per C. Triplett NP.

## 2020-02-10 NOTE — ED Provider Notes (Signed)
[facility] Emergency Department Provider Note ____________________________________________   First MD Initiated Contact with Patient 02/10/20 1538     (approximate)  I have reviewed the triage vital signs and the nursing notes.   HISTORY  Chief Complaint Cough, Fever, and Other (loss of taste)  HPI [name] is a 69 y.o. male with a history of type 2 diabetes and CAD who presents to the emergency department for treatment and evaluation of fever, chills, loss of taste, and persistent nonproductive cough.  Symptoms started 5 days ago.  He is a non-smoker.  He was sent here by urgent care for further evaluation after chest x-ray shows a pneumonia.  His rapid COVID-19 test was negative.  They did not give him any antibiotics or treatment at urgent care just sent him to the emergency department.         Past Medical History:  Diagnosis Date  . Acid reflux 04/12/2012  . Arteriosclerosis of coronary artery 04/12/2012   Overview:  1.  Atherosclerotic coronary artery disease.    a.  normal LV function.  Coronary arteriography 5/98 revealed 50% LAD lesion, 95% lesion in large diagonal branch. Left circumflex was normal.  25% proximal RCA lesion.  50% lesion in the PDA.    b.  PTCA of the first diagonal and placement of three multi-link stents.    c.  recent ETT Cardiolite revealed low probability for ischemia.     d.  cardiac catheterization on 05/18/97 per [name] revealed 80% LAD, 70% proximal, 90% mid first AL, 30% RCA.     e.  status post coronary bypass grafting x 2 per [name].    f.  recurrent chest pain with exertion, equivocal ETT.    g.  repeat cardiac catheterization revealed an occluded vein graft.     h.  medical therapy.    i.  ETT Myoview showed borderline changes.     j.  cardiac catheterization 8/02 revealed no significant change in anatomy from previous cath.     k. cardiac cath 06/19/05 revealed patent left internal mammary artery to  occluded LAD. Saphenous vein to D1 was chronically occ  . Benign essential HTN 11/19/2015  . Benign fibroma of prostate 03/05/2014  . Cannot sleep 03/05/2014  . Colon polyp 04/12/2012  . HLD (hyperlipidemia) 11/19/2015  . Presence of artificial eye 04/12/2012  . Spinal stenosis 04/12/2012  . Type 2 diabetes mellitus ([place]) 03/05/2014    Patient Active Problem List   Diagnosis Date Noted  . Erectile dysfunction 01/05/2020  . History of hip replacement 07/25/2019  . Hyperlipidemia associated with type 2 diabetes mellitus ([place]) 05/24/2018  . Hypertension associated with type 2 diabetes mellitus ([place]) 05/24/2018  . Type 2 diabetes mellitus with hyperglycemia, with long-term current use of insulin ([place]) 05/24/2018  . Benign fibroma of prostate 03/05/2014  . BPH (benign prostatic hyperplasia) 03/05/2014  . Insomnia 03/05/2014  . Arteriosclerosis of coronary artery 04/12/2012  . Colon polyp 04/12/2012  . Spinal stenosis 04/12/2012  . ASHD (arteriosclerotic heart disease) 04/12/2012  . GERD (gastroesophageal reflux disease) 04/12/2012  . Eye globe prosthesis 04/12/2012    Past Surgical History:  Procedure Laterality Date  . COLONOSCOPY WITH PROPOFOL N/A 12/13/2017   Procedure: COLONOSCOPY WITH PROPOFOL;  Surgeon: [name], MD;  Location: [facility] ENDOSCOPY;  Service: Endoscopy;  Laterality: N/A;  . CORONARY ARTERY BYPASS GRAFT  1998  . CORONARY STENT PLACEMENT  2006  . JOINT REPLACEMENT Left    hip-  06/2019  . 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 LEFT HEART CATH AND CORONARY ANGIOGRAPHY Left 06/28/2018   Procedure: LEFT HEART CATH AND CORONARY ANGIOGRAPHY;  Surgeon: [name], MD;  Location: [facility];  Service: Cardiovascular;  Laterality: Left;  . lt knee cap     had to have lt knee cap replaced    Prior to Admission medications   Medication Sig Start Date End Date Taking? Authorizing Provider  amitriptyline (ELAVIL) 10 MG tablet Take 2  tablets (20 mg total) by mouth at bedtime. 01/05/20   [name] T, NP  aspirin EC 325 MG tablet Take 325 mg by mouth daily.     [provider]  atenolol (TENORMIN) 50 MG tablet Take 50 mg by mouth daily.    [provider]  azithromycin (ZITHROMAX) 250 MG tablet 2 tablets today, then 1 tablet for the next 4 days. 02/10/20   Ellianna Ruest, Johnette Abraham B, FNP  chlorpheniramine-HYDROcodone (TUSSIONEX PENNKINETIC ER) 10-8 MG/5ML SUER Take 5 mLs by mouth every 12 (twelve) hours as needed for cough. 02/10/20   Francessca Friis, Johnette Abraham B, FNP  clopidogrel (PLAVIX) 75 MG tablet Take 75 mg by mouth daily.  11/02/17   [provider]  insulin degludec (TRESIBA FLEXTOUCH) 100 UNIT/ML FlexTouch Pen Inject 50 Units into the skin daily.    [provider]  metFORMIN (GLUCOPHAGE) 500 MG tablet TAKE 3 TABLETS BY MOUTH EVERY MORNING AND 2 TABLETS EVERY EVENING. 11/04/18   [provider]  nitroGLYCERIN (NITROSTAT) 0.4 MG SL tablet Place 0.4 mg under the tongue every 5 (five) minutes as needed.     [provider]  pantoprazole (PROTONIX) 40 MG tablet Take 1 tablet (40 mg total) by mouth daily. 01/22/20   Cannady, Henrine Screws T, NP  ramipril (ALTACE) 1.25 MG capsule Take 1.25 mg by mouth daily.    [provider]  rosuvastatin (CRESTOR) 40 MG tablet Take 1 tablet (40 mg total) by mouth daily. 01/05/20   [name], NP  UNABLE TO FIND Glucofix    [provider]    Allergies Trulicity [dulaglutide] and Penicillins  Family History  Problem Relation Age of Onset  . Hypertension Brother   . Diabetes Brother   . Heart disease Mother   . Heart disease Father   . Diabetes Daughter   . Schizophrenia Maternal Grandmother   . Heart disease Brother   . Prostate cancer Neg Hx   . Bladder Cancer Neg Hx   . Kidney cancer Neg Hx     Social History Social History   Tobacco Use  . Smoking status: Former Smoker    Quit date: 1998    Years since quitting: 23.4  .  Smokeless tobacco: Never Used  Substance Use Topics  . Alcohol use: Yes    Alcohol/week: 6.0 standard drinks    Types: 6 Cans of beer per week  . Drug use: No    Review of Systems  Constitutional: Positive for fever and chills.  Positive for decrease in ability to taste. Eyes: No visual changes. ENT: No sore throat. Cardiovascular: Denies chest pain. Respiratory: Does not for frequent cough Gastrointestinal: No abdominal pain.  No nausea, no vomiting.  No diarrhea.  No constipation. Genitourinary: Negative for dysuria. Musculoskeletal: Negative for back pain. Skin: Negative for rash. Neurological: Negative for headaches, focal weakness or numbness. ____________________________________________   PHYSICAL EXAM:  VITAL SIGNS: ED Triage Vitals  Enc Vitals Group  BP 02/10/20 1232 134/65     Pulse Rate 02/10/20 1232 72     Resp 02/10/20 1232 18     Temp 02/10/20 1232 99.3 F (37.4 C)     Temp Source 02/10/20 1232 Oral     SpO2 02/10/20 1232 94 %     Weight 02/10/20 1233 167 lb (75.8 kg)     Height 02/10/20 1233 5\' 11"  (1.803 m)     Head Circumference --      Peak Flow --      Pain Score 02/10/20 1233 3     Pain Loc --      Pain Edu? --      Excl. in [place]? --     Constitutional: Alert and oriented. Well appearing and in no acute distress. Eyes: Conjunctivae are normal. PERRL. EOMI. Head: Atraumatic. Nose: No congestion/rhinnorhea. Mouth/Throat: Mucous membranes are moist.  Oropharynx non-erythematous. Neck: No stridor.   Hematological/Lymphatic/Immunilogical: No cervical lymphadenopathy. Cardiovascular: Normal rate, regular rhythm. Grossly normal heart sounds.  Good peripheral circulation. Respiratory: Normal respiratory effort.  No retractions. Lungs CTAB. Gastrointestinal: Soft and nontender. No distention. No abdominal bruits. No CVA tenderness. Genitourinary:  Musculoskeletal: No lower extremity tenderness nor edema.  No joint effusions. Neurologic:  Normal  speech and language. No gross focal neurologic deficits are appreciated. No gait instability. Skin:  Skin is warm, dry and intact. No rash noted. Psychiatric: Mood and affect are normal. Speech and behavior are normal.  ____________________________________________   LABS (all labs ordered are listed, but only abnormal results are displayed)  Labs Reviewed  CBC WITH DIFFERENTIAL/PLATELET - Abnormal; Notable for the following components:      Result Value   RBC 4.15 (*)    Hemoglobin 12.9 (*)    HCT 37.5 (*)    Lymphs Abs 0.6 (*)    Abs Immature Granulocytes 0.08 (*)    All other components within normal limits  BASIC METABOLIC PANEL - Abnormal; Notable for the following components:   Glucose, Bld 149 (*)    BUN 34 (*)    Creatinine, Ser 1.83 (*)    GFR calc non Af Amer 36 (*)    GFR calc Af Amer 42 (*)    Anion gap 16 (*)    All other components within normal limits  LACTIC ACID, PLASMA - Abnormal; Notable for the following components:   Lactic Acid, Venous 2.3 (*)    All other components within normal limits  BASIC METABOLIC PANEL - Abnormal; Notable for the following components:   CO2 21 (*)    Glucose, Bld 156 (*)    BUN 34 (*)    Creatinine, Ser 1.66 (*)    Calcium 8.3 (*)    GFR calc non Af Amer 41 (*)    GFR calc Af Amer 47 (*)    All other components within normal limits  SARS CORONAVIRUS 2 BY RT PCR (HOSPITAL ORDER, [place] [facility])  CULTURE, BLOOD (ROUTINE X 2)  CULTURE, BLOOD (ROUTINE X 2)  LACTIC ACID, PLASMA   ____________________________________________  EKG  Not indicated ____________________________________________  RADIOLOGY  ED MD interpretation:    Chest x-ray shows right lower lobe pneumonia.  I, Sherrie George, personally viewed and evaluated these images (plain radiographs) as part of my medical decision making, as well as reviewing the written report by the radiologist.  Official radiology report(s): DG Chest 2  View  Result Date: 02/10/2020 CLINICAL DATA:  Fever and chills with cough and shortness of breath  EXAM: CHEST - 2 VIEW COMPARISON:  None. FINDINGS: There is airspace opacity in the posterior right lower lobe region. There is mild left base atelectasis. Lungs elsewhere clear. Heart size and pulmonary vascularity are normal. No adenopathy. Patient is status post coronary artery bypass grafting. No bone lesions. IMPRESSION: Airspace opacity consistent with pneumonia in the right lower lobe, primarily in the superior and posterior segment regions. Slight left base atelectasis. Lungs elsewhere clear. Cardiac silhouette normal. Status post coronary artery bypass grafting. Electronically Signed   By: Lowella Grip III M.D.   On: 02/10/2020 13:37    ____________________________________________   PROCEDURES  Procedure(s) performed (including Critical Care):  Procedures  ____________________________________________   INITIAL IMPRESSION / ASSESSMENT AND PLAN     69 year old male presenting to the emergency department for treatment and evaluation of symptoms as described in the HPI.  Plan will be to get some labs and obtain a 2-hour COVID-19 screening.  There is some report of hypoxia at urgent care, however I do not see a documented oxygen saturation below 95%.  He is not hypotensive or tachycardic here he is not tachypneic either.  Temperatures 100.1.  At this time he does not meet sepsis criteria.  DIFFERENTIAL DIAGNOSIS  COVID-19, pneumonia  ED COURSE  Lactic acid elevated at 2.3.  Will give a liter of fluids, azithromycin for his pneumonia and Decadron.  Will repeat lactic.  Vital signs remain reassuring.  COVID-19 test pending.  Repeat lactic acid is normal at 1.5. COVID-19 testing is negative.  BMP repeated. BUN and creatinine are still mildly elevated but not indicative of an acute kidney injury.   Patient is afebrile. Oxygen saturation is 97% on room air he is not tachypneic is not  tachycardic blood pressure is stable. Plan will be to discharge him home with azithromycin and cough medicine. He is to call and schedule follow-up appointment with his primary care provider. For symptoms of concern if he is unable to see primary care he was advised to return to the emergency department. He was also advised to drink lots of water, Gatorade, or Pedialyte to help fully rehydrate him. ____________________________________________   FINAL CLINICAL IMPRESSION(S) / ED DIAGNOSES  Final diagnoses:  Pneumonia of right lower lobe due to infectious organism  Dehydration     ED Discharge Orders         Ordered    azithromycin (ZITHROMAX) 250 MG tablet     02/10/20 1835    chlorpheniramine-HYDROcodone (TUSSIONEX PENNKINETIC ER) 10-8 MG/5ML SUER  Every 12 hours PRN     02/10/20 1835           [name] was evaluated in Emergency Department on 02/10/2020 for the symptoms described in the history of present illness. He was evaluated in the context of the global COVID-19 pandemic, which necessitated consideration that the patient might be at risk for infection with the SARS-CoV-2 virus that causes COVID-19. Institutional protocols and algorithms that pertain to the evaluation of patients at risk for COVID-19 are in a state of rapid change based on information released by regulatory bodies including the CDC and federal and state organizations. These policies and algorithms were followed during the patient's care in the ED.   Note:  This document was prepared using Dragon voice recognition software and may include unintentional dictation errors.   Victorino Dike, FNP 02/10/20 1841    Earleen Newport, MD 02/10/20 Kathyrn Drown

## 2020-02-10 NOTE — ED Triage Notes (Signed)
Pt comes POV with fever, chills, loss of taste. [facility] at [facility] and was negative for covid. Chest xray shows "a little bit of pneumonia". Denies CP/SOB. VSS. AOx4. Ambulatory.

## 2020-02-13 ENCOUNTER — Ambulatory Visit (INDEPENDENT_AMBULATORY_CARE_PROVIDER_SITE_OTHER): Payer: Medicare Other | Admitting: Pharmacist

## 2020-02-13 DIAGNOSIS — E785 Hyperlipidemia, unspecified: Secondary | ICD-10-CM

## 2020-02-13 DIAGNOSIS — I2510 Atherosclerotic heart disease of native coronary artery without angina pectoris: Secondary | ICD-10-CM

## 2020-02-13 DIAGNOSIS — E1169 Type 2 diabetes mellitus with other specified complication: Secondary | ICD-10-CM

## 2020-02-13 DIAGNOSIS — E1165 Type 2 diabetes mellitus with hyperglycemia: Secondary | ICD-10-CM

## 2020-02-13 DIAGNOSIS — Z794 Long term (current) use of insulin: Secondary | ICD-10-CM | POA: Diagnosis not present

## 2020-02-13 NOTE — Patient Instructions (Signed)
Visit Information  Goals Addressed            This Visit's Progress     Patient Stated   . PharmD "I can't afford my medication" (pt-stated)       CARE PLAN ENTRY (see longtitudinal plan of care for additional care plan information)  Current Barriers:  . Diabetes: uncontrolled; complicated by chronic medical conditions including coronary arteriosclerosis, HLD, CKD, insomnia, most recent A1c 7.2% o Recent ED visit for pneumonia. COVID negative. Currently on azithromycin + hydrocodone cough syrup. Reports his energy is not back to baseline, doesn't feel up to talking for long today.  o Reports [name] is no longer preferred w/ his insurance. Has appt w/ endocrinology tomorrow and will discuss with them at that time . Most recent eGFR: ~41 mL/min . Current antihyperglycemic regimen: Tresiba 50 units daily, metformin 1500 mg QAM, 1638 mg QPM o Hx Trulicity, nausea o Hx Invokana, noted to not be effective per endo . Cardiovascular risk reduction (hx CABG, last appt w/ Dr. Ubaldo Glassing was 09/2018) o Current hypertensive regimen: atenolol 50 mg daily, ramipril 1.25 mg daily o Current hyperlipidemia regimen: rosuvastatin 40 mg daily; Last lipid panel 10/2018, but TG too high to calculate LDL; LDL prior to that was 46 o Current antiplatelet regimen: ASA 325 mg daily, clopidogrel 75 mg daily - cath result from 2019 indicates DAPT w/ ASA 81 mg indefinitely . Insomnia: zolpidem 10 mg nightly, amitriptyline 20 mg QPM  Pharmacist Clinical Goal(s):  Marland Kitchen Over the next 90 days, patient will work with PharmD and primary care provider to address optimized medication management  Interventions: . Comprehensive medication review performed, medication list updated in electronic medical record . Inter-disciplinary care team collaboration (see longitudinal plan of care) . Discussed patient assistance program for insulin. Patient would qualify for assistance. He is going to talk to [facility] tomorrow at his  appointment and call me if we decide to pursue [place] assistance . Consider recheck of lipid panel at next appointment and adjustment of regimen as necessary to target LDL <70 . Recommend reducing ASA to 81 mg daily for DAPT. Marland Kitchen Pt requests f/u appt w/ PCP s/p ED visit for pneumonia. Will collaborate w/ clinic staff to have this scheduled.   Patient Self Care Activities:  . Patient will check blood glucose BID, document, and provide at future appointments . Patient will take medications as prescribed . Patient will report any questions or concerns to provider   Initial goal documentation        Patient verbalizes understanding of instructions provided today.  Plan: - Will await phone call from patient tomorrow regarding pursuing patient assistance  Catie Darnelle Maffucci, PharmD, [place] [phone]

## 2020-02-13 NOTE — Chronic Care Management (AMB) (Signed)
Chronic Care Management   Note  02/13/2020 Name: [name] MRN: [id number] DOB: 09/25/1950   Subjective:  [name] is a 69 y.o. year old male who is a primary care patient of Cannady, Barbaraann Faster, NP. The CCM team was consulted for assistance with chronic disease management and care coordination needs.    Contacted patient for medication management review.   Review of patient status, including review of consultants reports, laboratory and other test data, was performed as part of comprehensive evaluation and provision of chronic care management services.   SDOH (Social Determinants of Health) assessments and interventions performed:  yes  Objective:  Lab Results  Component Value Date   CREATININE 1.66 (H) 02/10/2020   CREATININE 1.83 (H) 02/10/2020   CREATININE 1.10 09/20/2014    Lab Results  Component Value Date   HGBA1C 7.2 11/06/2019    No results found for: CHOL, TRIG, HDL, CHOLHDL, VLDL, LDLCALC, LDLDIRECT  Clinical ASCVD: Yes - CABG in 90s The ASCVD Risk score Mikey Bussing DC Jr., et al., 2013) failed to calculate for the following reasons:   The valid total cholesterol range is 130 to 320 mg/dL    BP Readings from Last 3 Encounters:  02/10/20 (!) 154/69  01/05/20 138/61  02/07/19 128/78    Allergies  Allergen Reactions   Trulicity [Dulaglutide] Nausea Only   Penicillins Rash    Childhood. Did not require medical care.    Medications Reviewed Today    Reviewed by [name], NP (Nurse Practitioner) on 01/05/20 at [place] List Status: <None>  Medication Order Taking? Sig Documenting Provider Last Dose Status Informant  amitriptyline (ELAVIL) 10 MG tablet [id number] Yes Take 2 tablets (20 mg total) by mouth at bedtime. [name] T, NP  Active   aspirin EC 325 MG tablet [id number] Yes Take 325 mg by mouth daily.  [provider] Taking Active            Med Note Rosemarie Beath, MELISSA B   Mon Jun 27, 2018  4:52 PM)    atenolol  (TENORMIN) 50 MG tablet [id number] Yes Take 50 mg by mouth daily. [provider] Taking Active   clopidogrel (PLAVIX) 75 MG tablet [id number] Yes Take 75 mg by mouth daily.  [provider] Taking Active   insulin degludec (TRESIBA FLEXTOUCH) 100 UNIT/ML FlexTouch Pen [id number] Yes Inject 50 Units into the skin daily. [provider] Taking Active   lansoprazole (PREVACID) 30 MG capsule [id number] Yes Take 30 mg by mouth 2 (two) times daily before a meal.  [provider] Taking Active            Med Note Rosemarie Beath, MELISSA B   Mon Jun 27, 2018  4:52 PM)    metFORMIN (GLUCOPHAGE) 500 MG tablet [id number] Yes TAKE 3 TABLETS BY MOUTH EVERY MORNING AND 2 TABLETS EVERY EVENING. [provider] Taking Active   nitroGLYCERIN (NITROSTAT) 0.4 MG SL tablet [id number] Yes Place 0.4 mg under the tongue every 5 (five) minutes as needed.  [provider] Taking Active   ramipril (ALTACE) 1.25 MG capsule [id number] Yes Take 1.25 mg by mouth daily. [provider] Taking Active   rosuvastatin (CRESTOR) 40 MG tablet [id number]  Take 1 tablet (40 mg total) by mouth daily. [name], NP  Active   UNABLE TO FIND [id number] Yes Glucofix [provider] Taking Active            Assessment:   Goals Addressed  This Visit's Progress     Patient Stated    PharmD "I can't afford my medication" (pt-stated)       CARE PLAN ENTRY (see longtitudinal plan of care for additional care plan information)  Current Barriers:   Diabetes: uncontrolled; complicated by chronic medical conditions including coronary arteriosclerosis, HLD, CKD, insomnia, most recent A1c 7.2% o Recent ED visit for pneumonia. COVID negative. Currently on azithromycin + hydrocodone cough syrup. Reports his energy is not back to baseline, doesn't feel up to talking for long today.  o Reports [name] is no longer preferred w/ his insurance. Has appt w/ endocrinology  tomorrow and will discuss with them at that time  Most recent eGFR: ~41 mL/min  Current antihyperglycemic regimen: Tresiba 50 units daily, metformin 1500 mg QAM, 0762 mg QPM o Hx Trulicity, nausea o Hx Invokana, noted to not be effective per endo  Cardiovascular risk reduction (hx CABG, last appt w/ Dr. Ubaldo Glassing was 09/2018) o Current hypertensive regimen: atenolol 50 mg daily, ramipril 1.25 mg daily o Current hyperlipidemia regimen: rosuvastatin 40 mg daily; Last lipid panel 10/2018, but TG too high to calculate LDL; LDL prior to that was 46 o Current antiplatelet regimen: ASA 325 mg daily, clopidogrel 75 mg daily - cath result from 2019 indicates DAPT w/ ASA 81 mg indefinitely  Insomnia: zolpidem 10 mg nightly, amitriptyline 20 mg QPM  Pharmacist Clinical Goal(s):   Over the next 90 days, patient will work with PharmD and primary care provider to address optimized medication management  Interventions:  Comprehensive medication review performed, medication list updated in electronic medical record  Inter-disciplinary care team collaboration (see longitudinal plan of care)  Discussed patient assistance program for insulin. Patient would qualify for assistance. He is going to talk to [facility] tomorrow at his appointment and call me if we decide to pursue [place] assistance  Consider recheck of lipid panel at next appointment and adjustment of regimen as necessary to target LDL <70  Recommend reducing ASA to 81 mg daily for DAPT.  Pt requests f/u appt w/ PCP s/p ED visit for pneumonia. Will collaborate w/ clinic staff to have this scheduled.   Patient Self Care Activities:   Patient will check blood glucose BID, document, and provide at future appointments  Patient will take medications as prescribed  Patient will report any questions or concerns to provider   Initial goal documentation        Plan: - Will await phone call from patient tomorrow regarding pursuing  patient assistance  Catie Darnelle Maffucci, PharmD, [place] [phone]

## 2020-02-14 ENCOUNTER — Ambulatory Visit: Payer: Self-pay | Admitting: Pharmacist

## 2020-02-14 DIAGNOSIS — Z794 Long term (current) use of insulin: Secondary | ICD-10-CM

## 2020-02-14 DIAGNOSIS — E785 Hyperlipidemia, unspecified: Secondary | ICD-10-CM | POA: Diagnosis not present

## 2020-02-14 DIAGNOSIS — I2510 Atherosclerotic heart disease of native coronary artery without angina pectoris: Secondary | ICD-10-CM

## 2020-02-14 DIAGNOSIS — E1165 Type 2 diabetes mellitus with hyperglycemia: Secondary | ICD-10-CM

## 2020-02-14 DIAGNOSIS — E1169 Type 2 diabetes mellitus with other specified complication: Secondary | ICD-10-CM | POA: Diagnosis not present

## 2020-02-14 NOTE — Patient Instructions (Signed)
Visit Information  Goals Addressed            This Visit's Progress     Patient Stated   . PharmD "I can't afford my medication" (pt-stated)       CARE PLAN ENTRY (see longtitudinal plan of care for additional care plan information)  Current Barriers:  . Diabetes: uncontrolled; complicated by chronic medical conditions including coronary arteriosclerosis, HLD, CKD, insomnia, most recent A1c 7.2% o Notes he had endocrinology visit this morning, Malissa Hippo in agreement to pursue [place] assistance . Most recent eGFR: ~41 mL/min . Current antihyperglycemic regimen: Tresiba 50 units daily, though patient notes he is to increase by 2 units Q3 days until fasting <140, metformin 1500 mg QAM, 3244 mg QPM o Hx Trulicity, nausea o Hx Invokana, noted to not be effective per endo . Cardiovascular risk reduction (hx CABG, last appt w/ Dr. Ubaldo Glassing was 09/2018) o Current hypertensive regimen: atenolol 50 mg daily, ramipril 1.25 mg daily o Current hyperlipidemia regimen: rosuvastatin 40 mg daily; Last lipid panel 10/2018, but TG too high to calculate LDL; LDL prior to that was 46 o Current antiplatelet regimen: ASA 325 mg daily, clopidogrel 75 mg daily - cath result from 2019 indicates DAPT w/ ASA 81 mg indefinitely . Insomnia: zolpidem 10 mg nightly, amitriptyline 20 mg QPM  Pharmacist Clinical Goal(s):  Marland Kitchen Over the next 90 days, patient will work with PharmD and primary care provider to address optimized medication management  Interventions: . Comprehensive medication review performed, medication list updated in electronic medical record . Inter-disciplinary care team collaboration (see longitudinal plan of care) . Discussed process of patient assistance. Patient will bring copies of proof of income by the office tomorrow and sign [employer] application. Will collaborate w/ CPhT to fax provider portion to [facility] [place]. Once all parts received, will pass along to CPhT for  submission and follow up. Patient aware that [name] will ship to [facility].  . Informed that PCP in agreement to reduce ASA to 81 mg daily. Patient verbalizes understanding and will do this.   Patient Self Care Activities:  . Patient will check blood glucose BID, document, and provide at future appointments . Patient will take medications as prescribed . Patient will report any questions or concerns to provider   Please see past updates related to this goal by clicking on the "Past Updates" button in the selected goal         Patient verbalizes understanding of instructions provided today.   Plan:  - Will collaborate w/ patient, provider, and CPhT as above - Scheduled f/u call in ~ 6 weeks  Catie Darnelle Maffucci, PharmD, [place] [phone]

## 2020-02-14 NOTE — Chronic Care Management (AMB) (Signed)
Chronic Care Management   Follow Up Note   02/14/2020 Name: Ravinder Lukehart MRN: [id number] DOB: [birth year]  Referred by: [name], NP Reason for referral : Chronic Care Management (Medication Management)   Ken Bonn is a 69 y.o. year old male who is a primary care patient of Cannady, Barbaraann Faster, NP. The CCM team was consulted for assistance with chronic disease management and care coordination needs.    Received call from patient regarding medication access   Review of patient status, including review of consultants reports, relevant laboratory and other test results, and collaboration with appropriate care team members and the patient's provider was performed as part of comprehensive patient evaluation and provision of chronic care management services.    SDOH (Social Determinants of Health) assessments performed: Yes See Care Plan activities for detailed interventions related to [facility])     Outpatient Encounter Medications as of 02/14/2020  Medication Sig   insulin degludec (TRESIBA FLEXTOUCH) 100 UNIT/ML FlexTouch Pen Inject 50 Units into the skin daily.   amitriptyline (ELAVIL) 10 MG tablet Take 2 tablets (20 mg total) by mouth at bedtime.   aspirin EC 325 MG tablet Take 325 mg by mouth daily.    atenolol (TENORMIN) 50 MG tablet Take 50 mg by mouth daily.   azithromycin (ZITHROMAX) 250 MG tablet 2 tablets today, then 1 tablet for the next 4 days.   chlorpheniramine-HYDROcodone (TUSSIONEX PENNKINETIC ER) 10-8 MG/5ML SUER Take 5 mLs by mouth every 12 (twelve) hours as needed for cough.   clopidogrel (PLAVIX) 75 MG tablet Take 75 mg by mouth daily.    metFORMIN (GLUCOPHAGE) 500 MG tablet TAKE 3 TABLETS BY MOUTH EVERY MORNING AND 2 TABLETS EVERY EVENING.   nitroGLYCERIN (NITROSTAT) 0.4 MG SL tablet Place 0.4 mg under the tongue every 5 (five) minutes as needed.    pantoprazole (PROTONIX) 40 MG tablet Take 1 tablet (40 mg total) by mouth daily.   ramipril  (ALTACE) 1.25 MG capsule Take 1.25 mg by mouth daily.   rosuvastatin (CRESTOR) 40 MG tablet Take 1 tablet (40 mg total) by mouth daily.   UNABLE TO FIND Glucofix   No facility-administered encounter medications on file as of 02/14/2020.     Objective:   Goals Addressed            This Visit's Progress     Patient Stated    PharmD "I can't afford my medication" (pt-stated)       CARE PLAN ENTRY (see longtitudinal plan of care for additional care plan information)  Current Barriers:   Diabetes: uncontrolled; complicated by chronic medical conditions including coronary arteriosclerosis, HLD, CKD, insomnia, most recent A1c 7.2% o Notes he had endocrinology visit this morning, Malissa Hippo in agreement to pursue [place] assistance  Most recent eGFR: ~41 mL/min  Current antihyperglycemic regimen: Tresiba 50 units daily, though patient notes he is to increase by 2 units Q3 days until fasting <140, metformin 1500 mg QAM, 3329 mg QPM o Hx Trulicity, nausea o Hx Invokana, noted to not be effective per endo  Cardiovascular risk reduction (hx CABG, last appt w/ Dr. Ubaldo Glassing was 09/2018) o Current hypertensive regimen: atenolol 50 mg daily, ramipril 1.25 mg daily o Current hyperlipidemia regimen: rosuvastatin 40 mg daily; Last lipid panel 10/2018, but TG too high to calculate LDL; LDL prior to that was 46 o Current antiplatelet regimen: ASA 325 mg daily, clopidogrel 75 mg daily - cath result from 2019 indicates DAPT w/ ASA 81 mg indefinitely  Insomnia:  zolpidem 10 mg nightly, amitriptyline 20 mg QPM  Pharmacist Clinical Goal(s):   Over the next 90 days, patient will work with PharmD and primary care provider to address optimized medication management  Interventions:  Comprehensive medication review performed, medication list updated in electronic medical record  Inter-disciplinary care team collaboration (see longitudinal plan of care)  Discussed process of patient assistance.  Patient will bring copies of proof of income by the office tomorrow and sign [employer] application. Will collaborate w/ CPhT to fax provider portion to [facility] [place]. Once all parts received, will pass along to CPhT for submission and follow up. Patient aware that [name] will ship to [facility].   Informed that PCP in agreement to reduce ASA to 81 mg daily. Patient verbalizes understanding and will do this.   Patient Self Care Activities:   Patient will check blood glucose BID, document, and provide at future appointments  Patient will take medications as prescribed  Patient will report any questions or concerns to provider   Please see past updates related to this goal by clicking on the "Past Updates" button in the selected goal          Plan:  - Will collaborate w/ patient, provider, and CPhT as above - Scheduled f/u call in ~ 6 weeks  Catie Darnelle Maffucci, PharmD, [place] [phone]

## 2020-02-15 LAB — CULTURE, BLOOD (ROUTINE X 2): Culture: NO GROWTH

## 2020-02-18 ENCOUNTER — Encounter: Payer: Self-pay | Admitting: Nurse Practitioner

## 2020-02-18 DIAGNOSIS — E1122 Type 2 diabetes mellitus with diabetic chronic kidney disease: Secondary | ICD-10-CM | POA: Insufficient documentation

## 2020-02-18 DIAGNOSIS — N183 Chronic kidney disease, stage 3 unspecified: Secondary | ICD-10-CM | POA: Insufficient documentation

## 2020-02-20 ENCOUNTER — Other Ambulatory Visit: Payer: Self-pay | Admitting: Pharmacy Technician

## 2020-02-20 ENCOUNTER — Ambulatory Visit: Payer: Self-pay | Admitting: Pharmacist

## 2020-02-20 DIAGNOSIS — E1165 Type 2 diabetes mellitus with hyperglycemia: Secondary | ICD-10-CM

## 2020-02-20 DIAGNOSIS — Z794 Long term (current) use of insulin: Secondary | ICD-10-CM

## 2020-02-20 NOTE — Patient Instructions (Signed)
Visit Information  Goals Addressed            This Visit's Progress     Patient Stated   . PharmD "I can't afford my medication" (pt-stated)       CARE PLAN ENTRY (see longtitudinal plan of care for additional care plan information)  Current Barriers:  . Diabetes: uncontrolled; complicated by chronic medical conditions including coronary arteriosclerosis, HLD, CKD, insomnia, most recent A1c 7.2% o Working on [place] assistance through [employer] . Most recent eGFR: ~41 mL/min . Current antihyperglycemic regimen: Tresiba 50 units daily, though patient notes he is to increase by 2 units Q3 days until fasting <140, metformin 1500 mg QAM, 8887 mg QPM o Hx Trulicity, nausea o Hx Invokana, noted to not be effective per endo . Cardiovascular risk reduction (hx CABG, last appt w/ Dr. Ubaldo Glassing was 09/2018) o Current hypertensive regimen: atenolol 50 mg daily, ramipril 1.25 mg daily o Current hyperlipidemia regimen: rosuvastatin 40 mg daily; Last lipid panel 10/2018, but TG too high to calculate LDL; LDL prior to that was 46 o Current antiplatelet regimen: ASA 81 mg daily, clopidogrel 75 mg daily . Insomnia: zolpidem 10 mg nightly, amitriptyline 20 mg QPM  Pharmacist Clinical Goal(s):  Marland Kitchen Over the next 90 days, patient will work with PharmD and primary care provider to address optimized medication management  Interventions: . Comprehensive medication review performed, medication list updated in electronic medical record . Inter-disciplinary care team collaboration (see longitudinal plan of care) . Received patient portion of application. Passed along to CPhT. Once CPhT receives provider portion back from [place], will submit all parts to [employer] for approval.  Patient Self Care Activities:  . Patient will check blood glucose BID, document, and provide at future appointments . Patient will take medications as prescribed . Patient will report any questions or concerns to provider   Please  see past updates related to this goal by clicking on the "Past Updates" button in the selected goal         Patient verbalizes understanding of instructions provided today.    Plan:  - Will collaborate w/ patient, provider, and CPhT as above  Catie Darnelle Maffucci, PharmD, [place] [phone]

## 2020-02-20 NOTE — Patient Outreach (Signed)
[facility]) Care Management  02/20/2020  [name] 19-Mar-1951 [id number]                                      Medication Assistance Referral  Referral From: [facility] Embedded RPh Catie T.   Medication/Company: [name] / [employer] Patient application portion:  N/A Embedded [facility] RPh had signed while in clinic Provider application portion: Faxed  to Malissa Hippo, NP Provider address/fax verified via: Office website    Follow up:  Will follow up with provider in 5-7 business days to confirm application(s) have been received.  Tranisha Tissue P. Chrissa Meetze, [place]  [phone]

## 2020-02-20 NOTE — Chronic Care Management (AMB) (Signed)
Chronic Care Management   Follow Up Note   02/20/2020 Name: [name] MRN: [id number] DOB: 10/01/1950  Referred by: Brandon Lick, NP Reason for referral : Chronic Care Management (Medication [place])   [name] is a 69 y.o. year old male who is a primary care patient of Cannady, Brandon Faster, NP. The CCM team was consulted for assistance with chronic disease management and care coordination needs.    Care coordination completed today.   Review of patient status, including review of consultants reports, relevant laboratory and other test results, and collaboration with appropriate care team members and the patient's provider was performed as part of comprehensive patient evaluation and provision of chronic care management services.    SDOH (Social Determinants of Health) assessments performed: Yes See Care Plan activities for detailed interventions related to [facility])     Outpatient Encounter Medications as of 02/20/2020  Medication Sig   amitriptyline (ELAVIL) 10 MG tablet Take 2 tablets (20 mg total) by mouth at bedtime.   aspirin EC 325 MG tablet Take 325 mg by mouth daily.    atenolol (TENORMIN) 50 MG tablet Take 50 mg by mouth daily.   azithromycin (ZITHROMAX) 250 MG tablet 2 tablets today, then 1 tablet for the next 4 days.   chlorpheniramine-HYDROcodone (TUSSIONEX PENNKINETIC ER) 10-8 MG/5ML SUER Take 5 mLs by mouth every 12 (twelve) hours as needed for cough.   clopidogrel (PLAVIX) 75 MG tablet Take 75 mg by mouth daily.    insulin degludec (TRESIBA FLEXTOUCH) 100 UNIT/ML FlexTouch Pen Inject 50 Units into the skin daily.   metFORMIN (GLUCOPHAGE) 500 MG tablet TAKE 3 TABLETS BY MOUTH EVERY MORNING AND 2 TABLETS EVERY EVENING.   nitroGLYCERIN (NITROSTAT) 0.4 MG SL tablet Place 0.4 mg under the tongue every 5 (five) minutes as needed.    pantoprazole (PROTONIX) 40 MG tablet Take 1 tablet (40 mg total) by mouth daily.   ramipril (ALTACE) 1.25 MG capsule  Take 1.25 mg by mouth daily.   rosuvastatin (CRESTOR) 40 MG tablet Take 1 tablet (40 mg total) by mouth daily.   UNABLE TO FIND Glucofix   No facility-administered encounter medications on file as of 02/20/2020.     Objective:   Goals Addressed            This Visit's Progress     Patient Stated    PharmD "I can't afford my medication" (pt-stated)       CARE PLAN ENTRY (see longtitudinal plan of care for additional care plan information)  Current Barriers:   Diabetes: uncontrolled; complicated by chronic medical conditions including coronary arteriosclerosis, HLD, CKD, insomnia, most recent A1c 7.2% o Working on [place] assistance through [employer]  Most recent eGFR: ~41 mL/min  Current antihyperglycemic regimen: Tresiba 50 units daily, though patient notes he is to increase by 2 units Q3 days until fasting <140, metformin 1500 mg QAM, 3785 mg QPM o Hx Trulicity, nausea o Hx Invokana, noted to not be effective per endo  Cardiovascular risk reduction (hx CABG, last appt w/ Dr. Ubaldo Glassing was 09/2018) o Current hypertensive regimen: atenolol 50 mg daily, ramipril 1.25 mg daily o Current hyperlipidemia regimen: rosuvastatin 40 mg daily; Last lipid panel 10/2018, but TG too high to calculate LDL; LDL prior to that was 46 o Current antiplatelet regimen: ASA 81 mg daily, clopidogrel 75 mg daily  Insomnia: zolpidem 10 mg nightly, amitriptyline 20 mg QPM  Pharmacist Clinical Goal(s):   Over the next 90 days, patient will work with  PharmD and primary care provider to address optimized medication management  Interventions:  Comprehensive medication review performed, medication list updated in electronic medical record  Inter-disciplinary care team collaboration (see longitudinal plan of care)  Received patient portion of application. Passed along to CPhT. Once CPhT receives provider portion back from [place], will submit all parts to [employer] for approval.  Patient Self Care  Activities:   Patient will check blood glucose BID, document, and provide at future appointments  Patient will take medications as prescribed  Patient will report any questions or concerns to provider   Please see past updates related to this goal by clicking on the "Past Updates" button in the selected goal          Plan:  - Will collaborate w/ patient, provider, and CPhT as above  Catie Darnelle Maffucci, PharmD, [place] [phone]

## 2020-02-23 ENCOUNTER — Ambulatory Visit: Payer: Medicare Other | Admitting: Nurse Practitioner

## 2020-02-27 ENCOUNTER — Other Ambulatory Visit: Payer: Self-pay | Admitting: Pharmacy Technician

## 2020-02-27 NOTE — Patient Outreach (Signed)
[facility]) Care Management  02/27/2020  [name] May 30, 1951 [id number]  Received both patient and provider portion(s) of patient assistance application(s) for [place]. Faxed completed application and required documents into [employer].  Will follow up with company(ies) in 3-7 business days to check status of application(s).  Lucy Woolever P. Ciela Mahajan, [place]  [phone]

## 2020-02-28 ENCOUNTER — Other Ambulatory Visit: Payer: Self-pay

## 2020-02-28 ENCOUNTER — Ambulatory Visit (INDEPENDENT_AMBULATORY_CARE_PROVIDER_SITE_OTHER): Payer: Medicare Other | Admitting: Nurse Practitioner

## 2020-02-28 ENCOUNTER — Ambulatory Visit: Payer: Medicare Other

## 2020-02-28 ENCOUNTER — Encounter: Payer: Self-pay | Admitting: Nurse Practitioner

## 2020-02-28 VITALS — BP 132/78 | HR 58 | Temp 97.7°F | Wt 170.0 lb

## 2020-02-28 DIAGNOSIS — E1165 Type 2 diabetes mellitus with hyperglycemia: Secondary | ICD-10-CM

## 2020-02-28 DIAGNOSIS — E1159 Type 2 diabetes mellitus with other circulatory complications: Secondary | ICD-10-CM | POA: Diagnosis not present

## 2020-02-28 DIAGNOSIS — E785 Hyperlipidemia, unspecified: Secondary | ICD-10-CM

## 2020-02-28 DIAGNOSIS — Z794 Long term (current) use of insulin: Secondary | ICD-10-CM

## 2020-02-28 DIAGNOSIS — J189 Pneumonia, unspecified organism: Secondary | ICD-10-CM | POA: Insufficient documentation

## 2020-02-28 DIAGNOSIS — E1122 Type 2 diabetes mellitus with diabetic chronic kidney disease: Secondary | ICD-10-CM

## 2020-02-28 DIAGNOSIS — E1169 Type 2 diabetes mellitus with other specified complication: Secondary | ICD-10-CM

## 2020-02-28 DIAGNOSIS — I2510 Atherosclerotic heart disease of native coronary artery without angina pectoris: Secondary | ICD-10-CM

## 2020-02-28 DIAGNOSIS — F5101 Primary insomnia: Secondary | ICD-10-CM

## 2020-02-28 DIAGNOSIS — N521 Erectile dysfunction due to diseases classified elsewhere: Secondary | ICD-10-CM

## 2020-02-28 DIAGNOSIS — I10 Essential (primary) hypertension: Secondary | ICD-10-CM

## 2020-02-28 DIAGNOSIS — E538 Deficiency of other specified B group vitamins: Secondary | ICD-10-CM

## 2020-02-28 DIAGNOSIS — N183 Chronic kidney disease, stage 3 unspecified: Secondary | ICD-10-CM

## 2020-02-28 LAB — MICROALBUMIN, URINE WAIVED
Creatinine, Urine Waived: 200 mg/dL (ref 10–300)
Microalb, Ur Waived: 80 mg/L — ABNORMAL HIGH (ref 0–19)

## 2020-02-28 NOTE — Assessment & Plan Note (Signed)
Ongoing, most likely secondary to diabetes and cardiac history + medications.  He will discuss ED medications with cardiologist and if okay then will prescribe Cialis or Viagra as needed next visit.

## 2020-02-28 NOTE — Assessment & Plan Note (Addendum)
Chronic, ongoing with BP slightly above goal on SBP today, but repeat improved.  Goal <130/80.  Will continue current medication regimen and cardiology collaboration.  Recommend he monitor BP at home at least a few mornings a week and document for provider + focus on DASH diet.  Could consider addition of Amlodipine if ongoing elevation in SBP noted.  BMP and TSH today.  Return in 3 months.

## 2020-02-28 NOTE — Progress Notes (Signed)
BP 132/78 (BP Location: Left Arm)   Pulse (!) 58   Temp 97.7 F (36.5 C) (Oral)   Wt 170 lb (77.1 kg)   SpO2 99%   BMI 23.71 kg/m    Subjective:    Patient ID: [name], male    DOB: [birth year], 69 y.o.   MRN: [id number]  HPI: [name] is a 69 y.o. male  Chief Complaint  Patient presents with  . Hyperlipidemia  . Hypertension  . Diabetes  . Erectile Dysfunction   DIABETES Followed by endocrinology and last saw Dr. Honor Junes on 02/14/20 with A1C 8.2%.  Was diagnosed 6-7 years ago.  Metformin was reduced to 1000 MG in morning and 1000 MG in evening and Tresiba increased to 60 units daily.   Trulicity was discontinued months back due to chronic diarrhea, which GI felt was related to medication, has been much better with no further issues since stopping.  In past has taken Glimepiride, Jardiance (he does not know why they stopped), and Tradjenta.  Working with CCM team on costs and assistance.  Hypoglycemic episodes:no Polydipsia/polyuria: no Visual disturbance: no Chest pain: no Paresthesias: no Glucose Monitoring: yes             Accucheck frequency: Daily             Fasting glucose: 106 this morning -- sometimes gets into 280             Post prandial:             Evening:             Before meals: Taking Insulin?: yes             Long acting insulin: Tresiba 60 units             Short acting insulin: Blood Pressure Monitoring: not checking Retinal Examination: Not up to Date Foot Exam: Not up to Date Pneumovax: Up to Date Influenza: Up to Date Aspirin: yes   HYPERTENSION / HYPERLIPIDEMIA Followed by cardiology and history of CABG in 1998, stent in 2006, catheterization in 2011.  Last saw Dr. Ubaldo Glassing with cardiology on 10/10/2018, per this note to continue dual anti-platelet therapy. Continues on Ramipril 1.25 MG, Atenolol 50 MG, ASA, and Plavix.  Last echo in 2019 and 55% .  Also has NTG for angina pain, has not used in a long time. Started Crestor  last visit and is tolerating.  He inquired into medication for ED, reports he has difficult achieving an erection, has been ongoing for long while. Reports he will talk to cardiology and if they are okay with ED medication will order. Satisfied with current treatment? yes Duration of hypertension: chronic BP monitoring frequency: not checking BP range:  BP medication side effects: no Duration of hyperlipidemia: chronic Cholesterol medication side effects: no Cholesterol supplements: none Medication compliance: good compliance Aspirin: yes Recent stressors: no Recurrent headaches: no Visual changes: no Palpitations: no Dyspnea: no Chest pain: no Lower extremity edema: no Dizzy/lightheaded: no   INSOMNIA Was on Ambien for 10 years and was taken off of this due to age.  Saw psychiatrist in January, was put back on it, then in February was renewed through month of May.  He reports running out of this and then taking some old Amitriptyline 20 MG at night, last visit we continued Amitriptyline at 20 MG nightly -- after discussion on risks with Ambien use. Duration: chronic Satisfied with sleep quality: no Difficulty falling asleep:  no Difficulty staying asleep: yes Waking a few hours after sleep onset: yes Early morning awakenings: no Daytime hypersomnolence: no Wakes feeling refreshed: yes Good sleep hygiene: yes Apnea: no Snoring: no Depressed/anxious mood: no Recent stress: no Restless legs/nocturnal leg cramps: no Chronic pain/arthritis: no History of sleep study: no Treatments attempted: ambien   PNEUMONIA Was in ER 02/10/20 -- PNA to RLL, negative Covid testing x 2.  Was given a Zpack and Tussionex.  He reports overall improvement in symptoms.  Denies any further symptoms.  No fever, CP, cough, dyspnea, or wheezing. Dyspnea frequency: none Cough frequency: none  Relevant past medical, surgical, family and social history reviewed and updated as indicated. Interim  medical history since our last visit reviewed. Allergies and medications reviewed and updated.  Review of Systems  Constitutional: Negative for activity change, diaphoresis, fatigue and fever.  Respiratory: Negative for cough, chest tightness, shortness of breath and wheezing.   Cardiovascular: Negative for chest pain, palpitations and leg swelling.  Gastrointestinal: Negative.   Endocrine: Negative for polydipsia, polyphagia and polyuria.  Neurological: Negative.   Psychiatric/Behavioral: Negative.     Per HPI unless specifically indicated above     Objective:    BP 132/78 (BP Location: Left Arm)   Pulse (!) 58   Temp 97.7 F (36.5 C) (Oral)   Wt 170 lb (77.1 kg)   SpO2 99%   BMI 23.71 kg/m   Wt Readings from Last 3 Encounters:  02/28/20 170 lb (77.1 kg)  02/10/20 167 lb (75.8 kg)  01/05/20 168 lb 12.8 oz (76.6 kg)    Physical Exam Vitals and nursing note reviewed.  Constitutional:      General: He is awake. He is not in acute distress.    Appearance: He is well-developed and well-groomed. He is not ill-appearing.  HENT:     Head: Normocephalic and atraumatic.     Right Ear: Hearing normal. No drainage.     Left Ear: Hearing normal. No drainage.  Eyes:     General: Lids are normal.        Right eye: No discharge.        Left eye: No discharge.     Conjunctiva/sclera: Conjunctivae normal.     Pupils: Pupils are equal, round, and reactive to light.  Neck:     Thyroid: No thyromegaly.     Vascular: No carotid bruit.  Cardiovascular:     Rate and Rhythm: Normal rate and regular rhythm.     Heart sounds: Normal heart sounds, S1 normal and S2 normal. No murmur heard.  No gallop.   Pulmonary:     Effort: Pulmonary effort is normal. No accessory muscle usage or respiratory distress.     Breath sounds: Normal breath sounds.  Abdominal:     General: Bowel sounds are normal.     Palpations: Abdomen is soft.  Musculoskeletal:        General: Normal range of motion.      Cervical back: Normal range of motion and neck supple.     Right lower leg: No edema.     Left lower leg: No edema.  Lymphadenopathy:     Cervical: No cervical adenopathy.  Skin:    General: Skin is warm and dry.     Capillary Refill: Capillary refill takes less than 2 seconds.  Neurological:     Mental Status: He is alert and oriented to person, place, and time.     Deep Tendon Reflexes: Reflexes are normal and symmetric.  Reflex Scores:      Brachioradialis reflexes are 2+ on the right side and 2+ on the left side.      Patellar reflexes are 2+ on the right side and 2+ on the left side. Psychiatric:        Attention and Perception: Attention normal.        Mood and Affect: Mood normal.        Speech: Speech normal.        Behavior: Behavior normal. Behavior is cooperative.        Thought Content: Thought content normal.    Diabetic Foot Exam - Simple   Simple Foot Form Visual Inspection No deformities, no ulcerations, no other skin breakdown bilaterally: Yes Sensation Testing Intact to touch and monofilament testing bilaterally: Yes Pulse Check Posterior Tibialis and Dorsalis pulse intact bilaterally: Yes Comments    Results for orders placed or performed during the hospital encounter of 02/10/20  SARS Coronavirus 2 by RT PCR (hospital order, performed in [place] hospital lab) Nasopharyngeal Nasopharyngeal Swab   Specimen: Nasopharyngeal Swab  Result Value Ref Range   SARS Coronavirus 2 NEGATIVE NEGATIVE  Blood culture (routine x 2)   Specimen: BLOOD  Result Value Ref Range   Specimen Description BLOOD LEFT ASSIST CONTROL    Special Requests      BOTTLES DRAWN AEROBIC AND ANAEROBIC Blood Culture results may not be optimal due to an excessive volume of blood received in culture bottles   Culture      NO GROWTH 5 DAYS Performed at [facility], [address] [place], [place] [address]    Report Status 02/15/2020 FINAL   CBC with Differential   Result Value Ref Range   WBC 8.3 4.0 - 10.5 K/uL   RBC 4.15 (L) 4.22 - 5.81 MIL/uL   Hemoglobin 12.9 (L) 13.0 - 17.0 g/dL   HCT 37.5 (L) 39 - 52 %   MCV 90.4 80.0 - 100.0 fL   MCH 31.1 26.0 - 34.0 pg   MCHC 34.4 30.0 - 36.0 g/dL   RDW 13.7 11.5 - 15.5 %   Platelets 215 150 - 400 K/uL   nRBC 0.0 0.0 - 0.2 %   Neutrophils Relative % 81 %   Neutro Abs 6.7 1.7 - 7.7 K/uL   Lymphocytes Relative 7 %   Lymphs Abs 0.6 (L) 0.7 - 4.0 K/uL   Monocytes Relative 9 %   Monocytes Absolute 0.8 0 - 1 K/uL   Eosinophils Relative 2 %   Eosinophils Absolute 0.1 0 - 0 K/uL   Basophils Relative 0 %   Basophils Absolute 0.0 0 - 0 K/uL   Immature Granulocytes 1 %   Abs Immature Granulocytes 0.08 (H) 0.00 - 0.07 K/uL  Basic metabolic panel  Result Value Ref Range   Sodium 138 135 - 145 mmol/L   Potassium 3.9 3.5 - 5.1 mmol/L   Chloride 98 98 - 111 mmol/L   CO2 24 22 - 32 mmol/L   Glucose, Bld 149 (H) 70 - 99 mg/dL   BUN 34 (H) 8 - 23 mg/dL   Creatinine, Ser 1.83 (H) 0.61 - 1.24 mg/dL   Calcium 9.4 8.9 - 10.3 mg/dL   GFR calc non Af Amer 36 (L) >60 mL/min   GFR calc Af Amer 42 (L) >60 mL/min   Anion gap 16 (H) 5 - 15  Lactic acid, plasma  Result Value Ref Range   Lactic Acid, Venous 2.3 (HH) 0.5 - 1.9 mmol/L  Lactic acid, plasma  Result Value Ref Range   Lactic Acid, Venous 1.5 0.5 - 1.9 mmol/L  Basic metabolic panel  Result Value Ref Range   Sodium 138 135 - 145 mmol/L   Potassium 3.8 3.5 - 5.1 mmol/L   Chloride 105 98 - 111 mmol/L   CO2 21 (L) 22 - 32 mmol/L   Glucose, Bld 156 (H) 70 - 99 mg/dL   BUN 34 (H) 8 - 23 mg/dL   Creatinine, Ser 1.66 (H) 0.61 - 1.24 mg/dL   Calcium 8.3 (L) 8.9 - 10.3 mg/dL   GFR calc non Af Amer 41 (L) >60 mL/min   GFR calc Af Amer 47 (L) >60 mL/min   Anion gap 12 5 - 15      Assessment & Plan:   Problem List Items Addressed This Visit      Cardiovascular and Mediastinum   ASHD (arteriosclerotic heart disease)    Chronic, stable.  Continue  collaboration with cardiology.  Dual anti-platelet recommended to continue per cardiology.  Notes reviewed.  Recommend he schedule follow-up with cardiology over upcoming weeks.      Hypertension associated with type 2 diabetes mellitus (HCC)    Chronic, ongoing with BP slightly above goal on SBP today, but repeat improved.  Goal <130/80.  Will continue current medication regimen and cardiology collaboration.  Recommend he monitor BP at home at least a few mornings a week and document for provider + focus on DASH diet.  Could consider addition of Amlodipine if ongoing elevation in SBP noted.  BMP and TSH today.  Return in 3 months.      Relevant Orders   Basic metabolic panel   Microalbumin, Urine Waived   TSH     Respiratory   Pneumonia of right lower lobe due to infectious organism    Diagnosed 02/10/20, improved at this time with no further symptoms.  Will plan on repeat CXR around July 10th, ordered and provided instructions to patient on location to have this performed.  If any worsening symptoms or return of symptoms then call office immediately.      Relevant Orders   DG Chest 2 View     Endocrine   Hyperlipidemia associated with type 2 diabetes mellitus (HCC)    Chronic, ongoing.  Continue Crestor, tolerating at this time.  Adjust dose as needed.  Lipid panel today.      Relevant Orders   Lipid Panel w/o Chol/HDL Ratio   Type 2 diabetes mellitus with hyperglycemia, with long-term current use of insulin (HCC) - Primary    Chronic, ongoing.  Followed by endocrinology with recent A1C 8.2%.  Continue current medication regimen as prescribed by endocrinology, notes reviewed.  CCM collaboration continues.  Urine ALB check today.   Would benefit from [place] in future due to cardiac history.  Recommend he check BS at least twice a day, fasting and 2 hours after a meal.  Return in 3 months for follow-up.      CKD stage 3 due to type 2 diabetes mellitus ([place])    Noted on recent labs  in hospital, had PNA at time.  GFR 36 and CRT 1.83 at time.  Will recheck BMP today and urine ALB.  Continue ACE for kidney protection.        Relevant Orders   Microalbumin, Urine Waived     Other   Insomnia    Chronic, ongoing.  Would benefit from ongoing discontinuation of Ambien due to age >105.  Is tolerating  Amitriptyline.  Will continue this regimen at 20 MG.  In future if ongoing issues, could consider Belsomra.  Continue to monitor and adjust regimen as needed.  Recommend sleep hygiene techniques.      Erectile dysfunction    Ongoing, most likely secondary to diabetes and cardiac history + medications.  He will discuss ED medications with cardiologist and if okay then will prescribe Cialis or Viagra as needed next visit.       Other Visit Diagnoses    B12 deficiency       History of low levels reported and chronic Metformin use, check level today and start supplement as needed.   Relevant Orders   B12       Follow up plan: Return in about 3 months (around 05/30/2020) for T2DM, HTN/HLD.

## 2020-02-28 NOTE — Assessment & Plan Note (Signed)
Diagnosed 02/10/20, improved at this time with no further symptoms.  Will plan on repeat CXR around July 10th, ordered and provided instructions to patient on location to have this performed.  If any worsening symptoms or return of symptoms then call office immediately.

## 2020-02-28 NOTE — Assessment & Plan Note (Signed)
Chronic, ongoing.  Followed by endocrinology with recent A1C 8.2%.  Continue current medication regimen as prescribed by endocrinology, notes reviewed.  CCM collaboration continues.  Urine ALB check today.   Would benefit from [place] in future due to cardiac history.  Recommend he check BS at least twice a day, fasting and 2 hours after a meal.  Return in 3 months for follow-up.

## 2020-02-28 NOTE — Assessment & Plan Note (Signed)
Chronic, ongoing.  Would benefit from ongoing discontinuation of Ambien due to age >24.  Is tolerating Amitriptyline.  Will continue this regimen at 20 MG.  In future if ongoing issues, could consider Belsomra.  Continue to monitor and adjust regimen as needed.  Recommend sleep hygiene techniques.

## 2020-02-28 NOTE — Assessment & Plan Note (Signed)
Noted on recent labs in hospital, had PNA at time.  GFR 36 and CRT 1.83 at time.  Will recheck BMP today and urine ALB.  Continue ACE for kidney protection.

## 2020-02-28 NOTE — Patient Instructions (Signed)
On July 10th or around there during week days go to [address], [place], [place] [address] and obtain chest x-ray to recheck pneumonia.   Community-Acquired Pneumonia, Adult Pneumonia is an infection of the lungs. It causes swelling in the airways of the lungs. Mucus and fluid may also build up inside the airways. One type of pneumonia can happen while a person is in a hospital. A different type can happen when a person is not in a hospital (community-acquired pneumonia).  What are the causes?  This condition is caused by germs (viruses, bacteria, or fungi). Some types of germs can be passed from one person to another. This can happen when you breathe in droplets from the cough or sneeze of an infected person. What increases the risk? You are more likely to develop this condition if you:  Have a long-term (chronic) disease, such as: ? Chronic obstructive pulmonary disease (COPD). ? Asthma. ? Cystic fibrosis. ? Congestive heart failure. ? Diabetes. ? Kidney disease.  Have HIV.  Have sickle cell disease.  Have had your spleen removed.  Do not take good care of your teeth and mouth (poor dental hygiene).  Have a medical condition that increases the risk of breathing in droplets from your own mouth and nose.  Have a weakened body defense system (immune system).  Are a smoker.  Travel to areas where the germs that cause this illness are common.  Are around certain animals or the places they live. What are the signs or symptoms?  A dry cough.  A wet (productive) cough.  Fever.  Sweating.  Chest pain. This often happens when breathing deeply or coughing.  Fast breathing or trouble breathing.  Shortness of breath.  Shaking chills.  Feeling tired (fatigue).  Muscle aches. How is this treated? Treatment for this condition depends on many things. Most adults can be treated at home. In some cases, treatment must happen in a hospital. Treatment may include:  Medicines given  by mouth or through an IV tube.  Being given extra oxygen.  Respiratory therapy. In rare cases, treatment for very bad pneumonia may include:  Using a machine to help you breathe.  Having a procedure to remove fluid from around your lungs. Follow these instructions at home: Medicines  Take over-the-counter and prescription medicines only as told by your doctor. ? Only take cough medicine if you are losing sleep.  If you were prescribed an antibiotic medicine, take it as told by your doctor. Do not stop taking the antibiotic even if you start to feel better. General instructions   Sleep with your head and neck raised (elevated). You can do this by sleeping in a recliner or by putting a few pillows under your head.  Rest as needed. Get at least 8 hours of sleep each night.  Drink enough water to keep your pee (urine) pale yellow.  Eat a healthy diet that includes plenty of vegetables, fruits, whole grains, low-fat dairy products, and lean protein.  Do not use any products that contain nicotine or tobacco. These include cigarettes, e-cigarettes, and chewing tobacco. If you need help quitting, ask your doctor.  Keep all follow-up visits as told by your doctor. This is important. How is this prevented? A shot (vaccine) can help prevent pneumonia. Shots are often suggested for:  People older than 71 years of age.  People older than 71 years of age who: ? Are having cancer treatment. ? Have long-term (chronic) lung disease. ? Have problems with their  body's defense system. You may also prevent pneumonia if you take these actions:  Get the flu (influenza) shot every year.  Go to the dentist as often as told.  Wash your hands often. If you cannot use soap and water, use hand sanitizer. Contact a doctor if:  You have a fever.  You lose sleep because your cough medicine does not help. Get help right away if:  You are short of breath and it gets worse.  You have more chest  pain.  Your sickness gets worse. This is very serious if: ? You are an older adult. ? Your body's defense system is weak.  You cough up blood. Summary  Pneumonia is an infection of the lungs.  Most adults can be treated at home. Some will need treatment in a hospital.  Drink enough water to keep your pee pale yellow.  Get at least 8 hours of sleep each night. This information is not intended to replace advice given to you by your health care provider. Make sure you discuss any questions you have with your health care provider. Document Revised: 12/21/2018 Document Reviewed: 04/28/2018 Elsevier Patient Education  [place].

## 2020-02-28 NOTE — Assessment & Plan Note (Signed)
Chronic, stable.  Continue collaboration with cardiology.  Dual anti-platelet recommended to continue per cardiology.  Notes reviewed.  Recommend he schedule follow-up with cardiology over upcoming weeks.

## 2020-02-28 NOTE — Assessment & Plan Note (Signed)
Chronic, ongoing.  Continue Crestor, tolerating at this time.  Adjust dose as needed.  Lipid panel today.

## 2020-02-29 ENCOUNTER — Other Ambulatory Visit: Payer: Self-pay | Admitting: Pharmacy Technician

## 2020-02-29 ENCOUNTER — Encounter: Payer: Self-pay | Admitting: Nurse Practitioner

## 2020-02-29 DIAGNOSIS — E538 Deficiency of other specified B group vitamins: Secondary | ICD-10-CM | POA: Insufficient documentation

## 2020-02-29 LAB — BASIC METABOLIC PANEL
BUN/Creatinine Ratio: 8 — ABNORMAL LOW (ref 10–24)
BUN: 10 mg/dL (ref 8–27)
CO2: 24 mmol/L (ref 20–29)
Calcium: 8.7 mg/dL (ref 8.6–10.2)
Chloride: 102 mmol/L (ref 96–106)
Creatinine, Ser: 1.21 mg/dL (ref 0.76–1.27)
GFR calc Af Amer: 69 mL/min/{1.73_m2} (ref >59)
GFR calc non Af Amer: 60 mL/min/{1.73_m2} (ref >59)
Glucose: 90 mg/dL (ref 65–99)
Potassium: 4.7 mmol/L (ref 3.5–5.2)
Sodium: 142 mmol/L (ref 134–144)

## 2020-02-29 LAB — LIPID PANEL W/O CHOL/HDL RATIO
Cholesterol, Total: 148 mg/dL (ref 100–199)
HDL: 44 mg/dL (ref >39)
LDL Chol Calc (NIH): 61 mg/dL (ref 0–99)
Triglycerides: 271 mg/dL — ABNORMAL HIGH (ref 0–149)
VLDL Cholesterol Cal: 43 mg/dL — ABNORMAL HIGH (ref 5–40)

## 2020-02-29 LAB — TSH: TSH: 2.15 u[IU]/mL (ref 0.450–4.500)

## 2020-02-29 LAB — VITAMIN B12: Vitamin B-12: 174 pg/mL — ABNORMAL LOW (ref 232–1245)

## 2020-02-29 NOTE — Patient Outreach (Signed)
[facility]) Care Management  02/29/2020  [name] 1950-09-23 [id number]  Care coordination call placed to [place] in regards to patient's application for Tresiba.  Spoke to [place] who informed the application was currently being processed. She informed based on her inspection that the prescription portion of the application will need to be re faxed. She informed the only information they needs in the max daily dose section is a number indicating the max units used per day. The only information they need in the sig section is a simple QD, BID, TID, etc however many times of day they are injecting the medication. Then in the quantity field they just need it to say 120 days supply.  Informed Cassandra we would correct and re fax that information in.   ADDENDUM  Cassandra called me back and said the processing team APPROVED the application as submitted. Patient's approval dates are 02/29/2020-08/13/2020. She informed nothing needs to be re faxed into [employer]. She informs the medication should be delivered in the next 10-20 business days to the provider's office.  Will follow up with patient tin 14-21 business days to inquire if medication was received.  Ephram Kornegay P. Avice Fun[place], [place]  [phone]

## 2020-02-29 NOTE — Progress Notes (Signed)
Please let [name] know following: - Kidney function and electrolytes remain stable - Cholesterol levels look good with exception of triglycerides slightly elevated, will recheck next visit with him fasting again and adjust medication as needed. - Thyroid is normal - Vitamin B12 level is low at 174, would like to see this greater than 300.  B12 helps the nervous system, this may be low due to long term use Metformin.  I do recommend he start taking Vitamin B12 1000 MCG daily by mouth.  He can obtain this in any vitamin section.  We will recheck this next visit.   Have a great day!!

## 2020-03-04 ENCOUNTER — Telehealth: Payer: Self-pay | Admitting: Nurse Practitioner

## 2020-03-04 NOTE — Telephone Encounter (Signed)
Copied from [place] [id number]. Topic: Medicare AWV >> Mar 04, 2020  1:46 PM Weston Anna wrote:  Reason for VPL:[id number] reschedule AWV patient missed on February 28, 2020

## 2020-03-26 ENCOUNTER — Other Ambulatory Visit: Payer: Self-pay

## 2020-03-26 ENCOUNTER — Other Ambulatory Visit: Payer: Self-pay | Admitting: Pharmacy Technician

## 2020-03-26 ENCOUNTER — Ambulatory Visit
Admission: RE | Admit: 2020-03-26 | Discharge: 2020-03-26 | Disposition: A | Discharge status: Home / Self Care | Payer: Medicare Other | Attending: Nurse Practitioner | Admitting: Nurse Practitioner

## 2020-03-26 ENCOUNTER — Ambulatory Visit
Admission: RE | Admit: 2020-03-26 | Discharge: 2020-03-26 | Disposition: A | Discharge status: Home / Self Care | Payer: Medicare Other | Source: Ambulatory Visit | Attending: Nurse Practitioner | Admitting: Nurse Practitioner

## 2020-03-26 DIAGNOSIS — J189 Pneumonia, unspecified organism: Secondary | ICD-10-CM | POA: Diagnosis present

## 2020-03-26 NOTE — Patient Outreach (Signed)
[facility]) Care Management  03/26/2020  Joelle Flessner 13-Dec-1950 [id number]  Care coordination call placed to [place] in regards to delivery status of patient's Tresiba.  Per the automated IVR the medication order has been processed but not shipped and that [employer] is having shipping delays.  Will follow up with Novo Nordisk in 3-7 business days to check on the delivery status.  Isamu Trammel P. Corrinne Benegas, [place]  [phone]

## 2020-03-27 NOTE — Progress Notes (Signed)
Please let Mr. Roycroft know imaging returned and it appears pneumonia has resolved.  Great news.  No changes needed.  Have a wonderful day!!

## 2020-04-01 ENCOUNTER — Other Ambulatory Visit: Payer: Self-pay | Admitting: Pharmacy Technician

## 2020-04-01 NOTE — Patient Outreach (Signed)
[facility]) Care Management  04/01/2020  [name] [birth year] [id number]   Successful call placed to patient regarding patient assistance medication delivery of Tresiba, HIPAA identifiers verified.   Patient informs he is going to his provider's office this morning to pick up the [place]. Discussed refill procedure with patient which will require patient to contact his provider's office when he opens the last box of medication. The provider's office has to [facility] (they will also receive a fax when patient's refill is due) as [employer] will not accept refill requests from patients. Patient verbalized understanding. Confirmed patient had name and number as he had no other questions or concerns.  Follow up:  Will route note to embedded [place] for case closure as patient assistance has been completed and will remove myself from care team.  [name]. [name], [place]  [phone]

## 2020-04-03 ENCOUNTER — Ambulatory Visit (INDEPENDENT_AMBULATORY_CARE_PROVIDER_SITE_OTHER): Payer: Medicare Other | Admitting: Pharmacist

## 2020-04-03 DIAGNOSIS — E1169 Type 2 diabetes mellitus with other specified complication: Secondary | ICD-10-CM

## 2020-04-03 DIAGNOSIS — E785 Hyperlipidemia, unspecified: Secondary | ICD-10-CM

## 2020-04-03 DIAGNOSIS — E1165 Type 2 diabetes mellitus with hyperglycemia: Secondary | ICD-10-CM | POA: Diagnosis not present

## 2020-04-03 DIAGNOSIS — Z794 Long term (current) use of insulin: Secondary | ICD-10-CM | POA: Diagnosis not present

## 2020-04-03 NOTE — Patient Instructions (Signed)
[name],   It was great talking with you today!  Our goal A1c is <7%. This corresponds with fasting sugars <130 and 2 hour after meal sugars <180. Start checking sugars after supper to see how you are running.   Talk to [facility] about adding back a medication like Jardiance. We can pursue patient assistance through the manufacturer, [employer].   Call us with any questions!  Catie Darnelle Maffucci, PharmD [phone]   Visit Information  Goals Addressed              This Visit's Progress     Patient Stated     PharmD "I can't afford my medication" (pt-stated)        CARE PLAN ENTRY (see longtitudinal plan of care for additional care plan information)  Current Barriers:   Diabetes: uncontrolled; complicated by chronic medical conditions including coronary arteriosclerosis, HLD, CKD, insomnia, most recent A1c 8.2% o Reports he picked up his [place] from [facility] office on Monday  Most recent eGFR: ~41 mL/min  Current antihyperglycemic regimen: Tresiba 70 units daily; metformin 1000 mg QAM, 1000 mg QPM o APPROVED for [place] assistance through [employer] through 52/77/82 o Hx Trulicity, nausea o Hx Invokana, noted to not be effective per endo. Hx Jardiance, though patient unsure why it was discontinued. Perhaps cost.   Current glucose readings:  o Fastings 120-130s o After meals: not checking   Cardiovascular risk reduction (hx CABG, last appt w/ Dr. Ubaldo Glassing was 09/2018) o Current hypertensive regimen: atenolol 50 mg daily, ramipril 1.25 mg daily; BP at goal on last check in office o Current hyperlipidemia regimen: rosuvastatin 40 mg daily; last LDL 61, TG 271 o Current antiplatelet regimen: ASA 81 mg daily, clopidogrel 75 mg daily  Insomnia: amitriptyline 20 mg QPM; reports that this is "doing well" most nights.   Supplements: Vitamin B12 1000 mcg initiated after last PCP lab work and recommendation  Pharmacist Clinical Goal(s):   Over the next 90 days, patient  will work with PharmD and primary care provider to address optimized medication management  Interventions:  Comprehensive medication review performed, medication list updated in electronic medical record  Inter-disciplinary care team collaboration (see longitudinal plan of care)  Reviewed goal A1c, goal fasting, and goal 2 hour post prandial glucose. Discussed benefits of checking 2 hour post prandials to best characterize overall control and estimate A1c. Patient notes he will start checking after supper.   Discussed future options, including retrial of SGLT2 to reduce insulin burden, target post prandial glucoses, and provide ASCVD risk reduction benefit. Patient will discuss this with endocrinology at next appointment. Encouraged to collaborate w/ CCM team for SGLT2 assistance. Will mail this information to patient  Discussed recent lab work, including attainment of goal LDL. Discussed dietary strategies to reduce TG, such as reduction in carbs and alcohol consumption  Patient Self Care Activities:   Patient will check blood glucose BID, document, and provide at future appointments  Patient will take medications as prescribed  Patient will report any questions or concerns to provider   Please see past updates related to this goal by clicking on the "Past Updates" button in the selected goal         The patient verbalized understanding of instructions provided today and agreed to receive a mailed copy of patient instruction and/or educational materials.  Plan:  - Scheduled f/u call in ~ 8-10 weeks  Catie Darnelle Maffucci, PharmD, [place] [phone]

## 2020-04-03 NOTE — Chronic Care Management (AMB) (Signed)
Chronic Care Management   Follow Up Note   04/03/2020 Name: Diamonte Stavely MRN: [id number] DOB: April 04, 1951  Referred by: [name], NP Reason for referral : Chronic Care Management (Medication Management)   Emanuelle Bastos is a 69 y.o. year old male who is a primary care patient of Cannady, Barbaraann Faster, NP. The CCM team was consulted for assistance with chronic disease management and care coordination needs.    Contacted patient for medication management review.   Review of patient status, including review of consultants reports, relevant laboratory and other test results, and collaboration with appropriate care team members and the patient's provider was performed as part of comprehensive patient evaluation and provision of chronic care management services.    SDOH (Social Determinants of Health) assessments performed: Yes See Care Plan activities for detailed interventions related to SDOH)  SDOH Interventions     Most Recent Value  SDOH Interventions  Financial Strain Interventions Other (Comment)  [patient assistance proram]       Outpatient Encounter Medications as of 04/03/2020  Medication Sig   amitriptyline (ELAVIL) 10 MG tablet Take 2 tablets (20 mg total) by mouth at bedtime.   aspirin EC 81 MG tablet Take 81 mg by mouth daily.    atenolol (TENORMIN) 50 MG tablet Take 50 mg by mouth daily.   clopidogrel (PLAVIX) 75 MG tablet Take 75 mg by mouth daily.    insulin degludec (TRESIBA FLEXTOUCH) 100 UNIT/ML FlexTouch Pen Inject 70 Units into the skin daily.    metFORMIN (GLUCOPHAGE) 500 MG tablet Take 1,000 mg by mouth 2 (two) times daily with a meal.    pantoprazole (PROTONIX) 40 MG tablet Take 1 tablet (40 mg total) by mouth daily.   ramipril (ALTACE) 1.25 MG capsule Take 1.25 mg by mouth daily.   rosuvastatin (CRESTOR) 40 MG tablet Take 1 tablet (40 mg total) by mouth daily.   vitamin B-12 (CYANOCOBALAMIN) 1000 MCG tablet Take 1,000 mcg by mouth daily.     nitroGLYCERIN (NITROSTAT) 0.4 MG SL tablet Place 0.4 mg under the tongue every 5 (five) minutes as needed.  (Patient not taking: Reported on 02/28/2020)   No facility-administered encounter medications on file as of 04/03/2020.     Objective:   Goals Addressed              This Visit's Progress     Patient Stated     PharmD "I can't afford my medication" (pt-stated)        CARE PLAN ENTRY (see longtitudinal plan of care for additional care plan information)  Current Barriers:   Diabetes: uncontrolled; complicated by chronic medical conditions including coronary arteriosclerosis, HLD, CKD, insomnia, most recent A1c 8.2% o Reports he picked up his [place] from [facility] office on Monday  Most recent eGFR: ~41 mL/min  Current antihyperglycemic regimen: Tresiba 70 units daily; metformin 1000 mg QAM, 1000 mg QPM o APPROVED for [place] assistance through [employer] through 40/98/11 o Hx Trulicity, nausea o Hx Invokana, noted to not be effective per endo. Hx Jardiance, though patient unsure why it was discontinued. Perhaps cost.   Current glucose readings:  o Fastings 120-130s o After meals: not checking   Cardiovascular risk reduction (hx CABG, last appt w/ Dr. Ubaldo Glassing was 09/2018) o Current hypertensive regimen: atenolol 50 mg daily, ramipril 1.25 mg daily; BP at goal on last check in office o Current hyperlipidemia regimen: rosuvastatin 40 mg daily; last LDL 61, TG 271 o Current antiplatelet regimen: ASA 81 mg  daily, clopidogrel 75 mg daily  Insomnia: amitriptyline 20 mg QPM; reports that this is "doing well" most nights.   Supplements: Vitamin B12 1000 mcg initiated after last PCP lab work and recommendation  Pharmacist Clinical Goal(s):   Over the next 90 days, patient will work with PharmD and primary care provider to address optimized medication management  Interventions:  Comprehensive medication review performed, medication list updated in electronic medical  record  Inter-disciplinary care team collaboration (see longitudinal plan of care)  Reviewed goal A1c, goal fasting, and goal 2 hour post prandial glucose. Discussed benefits of checking 2 hour post prandials to best characterize overall control and estimate A1c. Patient notes he will start checking after supper.   Discussed future options, including retrial of SGLT2 to reduce insulin burden, target post prandial glucoses, and provide ASCVD risk reduction benefit. Patient will discuss this with endocrinology at next appointment. Encouraged to collaborate w/ CCM team for SGLT2 assistance. Will mail this information to patient  Discussed recent lab work, including attainment of goal LDL. Discussed dietary strategies to reduce TG, such as reduction in carbs and alcohol consumption  Patient Self Care Activities:   Patient will check blood glucose BID, document, and provide at future appointments  Patient will take medications as prescribed  Patient will report any questions or concerns to provider   Please see past updates related to this goal by clicking on the "Past Updates" button in the selected goal          Plan:  - Scheduled f/u call in ~ 8-10 weeks  Catie Darnelle Maffucci, PharmD, [place] [phone]

## 2020-04-16 ENCOUNTER — Other Ambulatory Visit: Payer: Self-pay | Admitting: Nurse Practitioner

## 2020-04-18 ENCOUNTER — Other Ambulatory Visit: Payer: Self-pay | Admitting: Nurse Practitioner

## 2020-04-18 MED ORDER — METFORMIN HCL 500 MG PO TABS: 1000.0000 mg | ORAL_TABLET | Freq: Two times a day (BID) | ORAL | 4 refills | Status: DC

## 2020-04-18 NOTE — Telephone Encounter (Signed)
Copied from [place] [id number]. Topic: Quick Communication - Rx Refill/Question >> Apr 18, 2020  2:30 PM Leward Quan A wrote: Medication: metFORMIN (GLUCOPHAGE) 500 MG tablet   Has the patient contacted their pharmacy? Yes.   (Agent: If no, request that the patient contact the pharmacy for the refill.) (Agent: If yes, when and what did the pharmacy advise?)  Preferred Pharmacy (with phone number or street name): [facility] [place], [place] - [address]  Phone:  [phone] Fax:  [phone]     Agent: Please be advised that RX refills may take up to 3 business days. We ask that you follow-up with your pharmacy.

## 2020-04-18 NOTE — Telephone Encounter (Signed)
Requested medication (s) are due for refill today: yes  Requested medication (s) are on the active medication list: yes  Last refill: 11/05/18  historic provider  Future visit scheduled: yes  Notes to clinic: Historic provider  Last OV 02/28/20    Requested Prescriptions  Pending Prescriptions Disp Refills   metFORMIN (GLUCOPHAGE) 500 MG tablet      Sig: Take 2 tablets (1,000 mg total) by mouth 2 (two) times daily with a meal.      Endocrinology:  Diabetes - Biguanides Passed - 04/18/2020  2:36 PM      Passed - Cr in normal range and within 360 days    Creatinine  Date Value Ref Range Status  09/20/2014 1.10 0.60 - 1.30 mg/dL Final   Creatinine, Ser  Date Value Ref Range Status  02/28/2020 1.21 0.76 - 1.27 mg/dL Final          Passed - HBA1C is between 0 and 7.9 and within 180 days    Hemoglobin A1C  Date Value Ref Range Status  11/06/2019 7.2  Final          Passed - eGFR in normal range and within 360 days    EGFR (African American)  Date Value Ref Range Status  09/20/2014 >60 >11m/min Final  05/29/2014 >60  Final   GFR calc Af Amer  Date Value Ref Range Status  02/28/2020 69 >59 mL/min/1.73 Final    Comment:    **[facility] currently reports eGFR in compliance with the current**   recommendations of the [employer] [facility] will   update reporting as new guidelines are published from the NKF-ASN   Task force.    EGFR (Non-African Amer.)  Date Value Ref Range Status  09/20/2014 >60 >621mmin Final    Comment:    eGFR values <6054min/1.73 m2 may be an indication of chronic kidney disease (CKD). Calculated eGFR, using the MRDR Study equation, is useful in  patients with stable renal function. The eGFR calculation will not be reliable in acutely ill patients when serum creatinine is changing rapidly. It is not useful in patients on dialysis. The eGFR calculation may not be applicable to patients at the low and high extremes of body sizes,  pregnant women, and vegetarians.   05/29/2014 >60  Final    Comment:    eGFR values <22m45mn/1.73 m2 may be an indication of chronic kidney disease (CKD). Calculated eGFR is useful in patients with stable renal function. The eGFR calculation will not be reliable in acutely ill patients when serum creatinine is changing rapidly. It is not useful in  patients on dialysis. The eGFR calculation may not be applicable to patients at the low and high extremes of body sizes, pregnant women, and vegetarians.    GFR calc non Af Amer  Date Value Ref Range Status  02/28/2020 60 >59 mL/min/1.73 Final          Passed - Valid encounter within last 6 months    Recent Outpatient Visits           1 month ago Type 2 diabetes mellitus with hyperglycemia, with long-term current use of insulin (HCC[facility]   3 months ago Encounter to establish care   [facility]       Future Appointments             In 3 weeks  [facility] [place] [facility] [place]  Barbaraann Faster, NP [place], [name]

## 2020-05-13 ENCOUNTER — Ambulatory Visit (INDEPENDENT_AMBULATORY_CARE_PROVIDER_SITE_OTHER): Payer: Medicare Other

## 2020-05-13 VITALS — Ht 71.0 in | Wt 180.0 lb

## 2020-05-13 DIAGNOSIS — Z0000 Encounter for general adult medical examination without abnormal findings: Secondary | ICD-10-CM | POA: Diagnosis not present

## 2020-05-13 NOTE — Progress Notes (Signed)
I connected with [name] today by telephone and verified that I am speaking with the correct person using two identifiers. Location patient: home Location provider: work Persons participating in the virtual visit: Mansour Balboa, Glenna Durand LPN.   I discussed the limitations, risks, security and privacy concerns of performing an evaluation and management service by telephone and the availability of in person appointments. I also discussed with the patient that there may be a patient responsible charge related to this service. The patient expressed understanding and verbally consented to this telephonic visit.    Interactive audio and video telecommunications were attempted between this provider and patient, however failed, due to patient having technical difficulties OR patient did not have access to video capability.  We continued and completed visit with audio only.   Vital signs may be patient reported or missing.  Subjective:   [name] is a 69 y.o. male who presents for Medicare Annual/Subsequent preventive examination.  Review of Systems     Cardiac Risk Factors include: advanced age (>64mn, >>73women);diabetes mellitus;hypertension;male gender     Objective:    Today's Vitals   05/13/20 0856  Weight: 180 lb (81.6 kg)  Height: '5\' 11"'  (1.803 m)   Body mass index is 25.1 kg/m.  Advanced Directives 05/13/2020 02/10/2020 02/07/2019 06/01/2018 02/22/2018 12/13/2017  Does Patient Have a Medical Advance Directive? No No No No No No  Would patient like information on creating a medical advance directive? - - - No - Patient declined No - Patient declined No - Patient declined    Current Medications (verified) Outpatient Encounter Medications as of 05/13/2020  Medication Sig  . amitriptyline (ELAVIL) 10 MG tablet Take 2 tablets (20 mg total) by mouth at bedtime.  .Marland Kitchenaspirin EC 81 MG tablet Take 81 mg by mouth daily.   .Marland Kitchenatenolol (TENORMIN) 50 MG tablet Take 50 mg by mouth  daily.  . clopidogrel (PLAVIX) 75 MG tablet Take 75 mg by mouth daily.   . insulin degludec (TRESIBA FLEXTOUCH) 100 UNIT/ML FlexTouch Pen Inject 70 Units into the skin daily.   . metFORMIN (GLUCOPHAGE) 500 MG tablet Take 2 tablets (1,000 mg total) by mouth 2 (two) times daily with a meal.  . pantoprazole (PROTONIX) 40 MG tablet TAKE 1 TABLET BY MOUTH EVERY DAY  . ramipril (ALTACE) 1.25 MG capsule Take 1.25 mg by mouth daily.  . rosuvastatin (CRESTOR) 40 MG tablet Take 1 tablet (40 mg total) by mouth daily.  . vitamin B-12 (CYANOCOBALAMIN) 1000 MCG tablet Take 1,000 mcg by mouth daily.  . nitroGLYCERIN (NITROSTAT) 0.4 MG SL tablet Place 0.4 mg under the tongue every 5 (five) minutes as needed.  (Patient not taking: Reported on 02/28/2020)   No facility-administered encounter medications on file as of 05/13/2020.    Allergies (verified) Trulicity [dulaglutide] and Penicillins   History: Past Medical History:  Diagnosis Date  . Acid reflux 04/12/2012  . Arteriosclerosis of coronary artery 04/12/2012   Overview:  1.  Atherosclerotic coronary artery disease.    a.  normal LV function.  Coronary arteriography 5/98 revealed 50% LAD lesion, 95% lesion in large diagonal branch. Left circumflex was normal.  25% proximal RCA lesion.  50% lesion in the PDA.    b.  PTCA of the first diagonal and placement of three multi-link stents.    c.  recent ETT Cardiolite revealed low probability for ischemia.     d.  cardiac catheterization on 05/18/97 per BSerafina Royalsrevealed 80% LAD, 70% proximal, 90%  mid first AL, 30% RCA.     e.  status post coronary bypass grafting x 2 per [name].    f.  recurrent chest pain with exertion, equivocal ETT.    g.  repeat cardiac catheterization revealed an occluded vein graft.     h.  medical therapy.    i.  ETT Myoview showed borderline changes.     j.  cardiac catheterization 8/02 revealed no significant change in anatomy from previous cath.     k. cardiac cath 06/19/05 revealed  patent left internal mammary artery to occluded LAD. Saphenous vein to D1 was chronically occ  . Benign essential HTN 11/19/2015  . Benign fibroma of prostate 03/05/2014  . Cannot sleep 03/05/2014  . Colon polyp 04/12/2012  . HLD (hyperlipidemia) 11/19/2015  . Presence of artificial eye 04/12/2012  . Spinal stenosis 04/12/2012  . Type 2 diabetes mellitus ([place]) 03/05/2014   Past Surgical History:  Procedure Laterality Date  . COLONOSCOPY WITH PROPOFOL N/A 12/13/2017   Procedure: COLONOSCOPY WITH PROPOFOL;  Surgeon: [name], MD;  Location: [facility] ENDOSCOPY;  Service: Endoscopy;  Laterality: N/A;  . CORONARY ARTERY BYPASS GRAFT  1998  . CORONARY STENT PLACEMENT  2006  . JOINT REPLACEMENT Left    hip- 06/2019  . 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 LEFT HEART CATH AND CORONARY ANGIOGRAPHY Left 06/28/2018   Procedure: LEFT HEART CATH AND CORONARY ANGIOGRAPHY;  Surgeon: [name], MD;  Location: [facility];  Service: Cardiovascular;  Laterality: Left;  . lt knee cap     had to have lt knee cap replaced   Family History  Problem Relation Age of Onset  . Hypertension Brother   . Diabetes Brother   . Heart disease Mother   . Heart disease Father   . Diabetes Daughter   . Schizophrenia Maternal Grandmother   . Heart disease Brother   . Prostate cancer Neg Hx   . Bladder Cancer Neg Hx   . Kidney cancer Neg Hx    Social History   Socioeconomic History  . Marital status: Married    Spouse name: Not on file  . Number of children: Not on file  . Years of education: Not on file  . Highest education level: Not on file  Occupational History  . Not on file  Tobacco Use  . Smoking status: Former Smoker    Quit date: 1998    Years since quitting: 23.6  . Smokeless tobacco: Never Used  Vaping Use  . Vaping Use: Never used  Substance and Sexual Activity  . Alcohol use: Yes    Alcohol/week: 6.0 standard drinks    Types: 6 Cans of beer per week  .  Drug use: No  . Sexual activity: Yes  Other Topics Concern  . Not on file  Social History Narrative  . Not on file   Social Determinants of Health   Financial Resource Strain: Low Risk   . Difficulty of Paying Living Expenses: Not hard at all  Food Insecurity: No Food Insecurity  . Worried About Charity fundraiser in the Last Year: Never true  . Ran Out of Food in the Last Year: Never true  Transportation Needs: No Transportation Needs  . Lack of Transportation (Medical): No  . Lack of Transportation (Non-Medical): No  Physical Activity: Insufficiently Active  . Days of Exercise per Week: 2 days  . Minutes of Exercise per  Session: 30 min  Stress: No Stress Concern Present  . Feeling of Stress : Not at all  Social Connections: Socially Integrated  . Frequency of Communication with Friends and Family: Three times a week  . Frequency of Social Gatherings with Friends and Family: Three times a week  . Attends Religious Services: More than 4 times per year  . Active Member of Clubs or Organizations: Yes  . Attends Archivist Meetings: Never  . Marital Status: Married    Tobacco Counseling Counseling given: Not Answered   Clinical Intake:  Pre-visit preparation completed: Yes  Pain : No/denies pain     Nutritional Status: BMI 25 -29 Overweight Nutritional Risks: None Diabetes: Yes  How often do you need to have someone help you when you read instructions, pamphlets, or other written materials from your doctor or pharmacy?: 1 - Never What is the last grade level you completed in school?: 12th grade  Diabetic? Yes Nutrition Risk Assessment:  Has the patient had any N/V/D within the last 2 months?  No  Does the patient have any non-healing wounds?  No  Has the patient had any unintentional weight loss or weight gain?  No   Diabetes:  Is the patient diabetic?  Yes  If diabetic, was a CBG obtained today?  No  Did the patient bring in their glucometer from  home?  No  How often do you monitor your CBG's? daily.   Financial Strains and Diabetes Management:  Are you having any financial strains with the device, your supplies or your medication? No .  Does the patient want to be seen by Chronic Care Management for management of their diabetes?  No  Would the patient like to be referred to a Nutritionist or for Diabetic Management?  No   Diabetic Exams:  Diabetic Eye Exam: Overdue for diabetic eye exam. Pt has been advised about the importance in completing this exam. Patient advised to call and schedule an eye exam. Diabetic Foot Exam: Completed 02/28/2020   Interpreter Needed?: No  Information entered by :: NAllen LPN   Activities of Daily Living In your present state of health, do you have any difficulty performing the following activities: 05/13/2020 01/05/2020  Hearing? N N  Vision? N N  Difficulty concentrating or making decisions? N N  Walking or climbing stairs? N N  Dressing or bathing? N N  Doing errands, shopping? N N  Preparing Food and eating ? N -  Using the Toilet? N -  In the past six months, have you accidently leaked urine? N -  Do you have problems with loss of bowel control? N -  Managing your Medications? N -  Managing your Finances? N -  Housekeeping or managing your Housekeeping? N -  Some recent data might be hidden    Patient Care Team: [name], NP as PCP - General (Nurse Practitioner)  Indicate any recent Medical Services you may have received from other than [facility] providers in the past year (date may be approximate).     Assessment:   [facility] for [facility]  Hearing/Vision screen  Hearing Screening   '125Hz'  '250Hz'  '500Hz'  '1000Hz'  '2000Hz'  '3000Hz'  '4000Hz'  '6000Hz'  '8000Hz'   Right ear:           Left ear:           Vision Screening Comments: No regular eye exams, [facility]  Dietary issues and exercise activities discussed: Current Exercise Habits: Home exercise  routine, Type of exercise: walking, Time (Minutes): 30, Frequency (Times/Week): 2, Weekly Exercise (Minutes/Week): 60  Goals    .  Patient Stated      05/13/2020, wants to get off insulin    .  PharmD "I can't afford my medication" (pt-stated)      CARE PLAN ENTRY (see longtitudinal plan of care for additional care plan information)  Current Barriers:  . Diabetes: uncontrolled; complicated by chronic medical conditions including coronary arteriosclerosis, HLD, CKD, insomnia, most recent A1c 8.2% o Reports he picked up his [place] from [facility] End[facility] office on Monday . Most recent eGFR: ~41 mL/min . Current antihyperglycemic regimen: Tresiba 70 units daily; metformin 1000 mg QAM, 1000 mg QPM o APPROVED for [place] assistance through [employer] through 17/40/81 o Hx Trulicity, nausea o Hx Invokana, noted to not be effective per endo. Hx Jardiance, though patient unsure why it was discontinued. Perhaps cost.  . Current glucose readings:  o Fastings 120-130s o After meals: not checking  . Cardiovascular risk reduction (hx CABG, last appt w/ Dr. Ubaldo Glassing was 09/2018) o Current hypertensive regimen: atenolol 50 mg daily, ramipril 1.25 mg daily; BP at goal on last check in office o Current hyperlipidemia regimen: rosuvastatin 40 mg daily; last LDL 61, TG 271 o Current antiplatelet regimen: ASA 81 mg daily, clopidogrel 75 mg daily . Insomnia: amitriptyline 20 mg QPM; reports that this is "doing well" most nights.  . Supplements: Vitamin B12 1000 mcg initiated after last PCP lab work and recommendation  Pharmacist Clinical Goal(s):  Marland Kitchen Over the next 90 days, patient will work with PharmD and primary care provider to address optimized medication management  Interventions: . Comprehensive medication review performed, medication list updated in electronic medical record . Inter-disciplinary care team collaboration (see longitudinal plan of care) . Reviewed goal A1c, goal fasting, and goal 2 hour post  prandial glucose. Discussed benefits of checking 2 hour post prandials to best characterize overall control and estimate A1c. Patient notes he will start checking after supper.  . Discussed future options, including retrial of SGLT2 to reduce insulin burden, target post prandial glucoses, and provide ASCVD risk reduction benefit. Patient will discuss this with endocrinology at next appointment. Encouraged to collaborate w/ CCM team for SGLT2 assistance. Will mail this information to patient . Discussed recent lab work, including attainment of goal LDL. Discussed dietary strategies to reduce TG, such as reduction in carbs and alcohol consumption  Patient Self Care Activities:  . Patient will check blood glucose BID, document, and provide at future appointments . Patient will take medications as prescribed . Patient will report any questions or concerns to provider   Please see past updates related to this goal by clicking on the "Past Updates" button in the selected goal        Depression Screen PHQ 2/9 Scores 05/13/2020 01/05/2020  PHQ - 2 Score 0 0    Fall Risk Fall Risk  05/13/2020 01/05/2020  Falls in the past year? 0 0  Number falls in past yr: - 0  Injury with Fall? - 0  Risk for fall due to : Medication side effect -  Follow up Falls evaluation completed;Education provided;Falls prevention discussed Falls evaluation completed    Any stairs in or around the home? Yes  If so, are there any without handrails? No  Home free of loose throw rugs in walkways, pet beds, electrical cords, etc? Yes  Adequate lighting in your home to reduce risk of falls? Yes   ASSISTIVE DEVICES  UTILIZED TO PREVENT FALLS:  Life alert? No  Use of a cane, walker or w/c? No  Grab bars in the bathroom? Yes  Shower chair or bench in shower? No  Elevated toilet seat or a handicapped toilet? No   TIMED UP AND GO:  Was the test performed? No .    Cognitive Function:     6CIT Screen 05/13/2020  What  Year? 0 points  What month? 0 points  What time? 0 points  Count back from 20 0 points  Months in reverse 0 points  Repeat phrase 2 points  Total Score 2    Immunizations Immunization History  Administered Date(s) Administered  . Fluad Quad(high Dose 65+) 08/14/2019  . Influenza, High Dose Seasonal PF 05/31/2015, 06/16/2017, 07/12/2018  . Influenza-Unspecified 06/20/2014, 06/26/2015, 08/27/2016, 09/11/2016  . PFIZER SARS-COV-2 Vaccination 11/04/2019, 11/26/2019  . Pneumococcal Conjugate-13 06/20/2014  . Pneumococcal Polysaccharide-23 06/21/2015, 06/25/2015  . Td 09/08/2008  . Tdap 05/30/2012, 06/16/2018    TDAP status: Up to date Flu Vaccine status: Up to date Pneumococcal vaccine status: Up to date Covid-19 vaccine status: Completed vaccines  Qualifies for Shingles Vaccine? Yes   Zostavax completed Yes   Shingrix Completed?: No.    Education has been provided regarding the importance of this vaccine. Patient has been advised to call insurance company to determine out of pocket expense if they have not yet received this vaccine. Advised may also receive vaccine at local pharmacy or Health Dept. Verbalized acceptance and understanding.  Screening Tests Health Maintenance  Topic Date Due  . OPHTHALMOLOGY EXAM  Never done  . INFLUENZA VACCINE  04/14/2020  . HEMOGLOBIN A1C  05/05/2020  . Hepatitis C Screening  01/04/2021 (Originally [birth year])  . FOOT EXAM  02/27/2021  . COLONOSCOPY  12/14/2027  . TETANUS/TDAP  06/16/2028  . COVID-19 Vaccine  Completed  . PNA vac Low Risk Adult  Completed    Health Maintenance  Health Maintenance Due  Topic Date Due  . OPHTHALMOLOGY EXAM  Never done  . INFLUENZA VACCINE  04/14/2020  . HEMOGLOBIN A1C  05/05/2020    Colorectal cancer screening: Completed 41/2019. Repeat every 10 years  Lung Cancer Screening: (Low Dose CT Chest recommended if Age 63-80 years, 30 pack-year currently smoking OR have quit w/in 15years.) does not qualify.     Lung Cancer Screening Referral: no  Additional Screening:  Hepatitis C Screening: does qualify; Decline  Vision Screening: Recommended annual ophthalmology exams for early detection of glaucoma and other disorders of the eye. Is the patient up to date with their annual eye exam?  No  Who is the provider or what is the name of the office in which the patient attends annual eye exams? [facility] If pt is not established with a provider, would they like to be referred to a provider to establish care? No .   Dental Screening: Recommended annual dental exams for proper oral hygiene  Community Resource Referral / Chronic Care Management: CRR required this visit?  No   CCM required this visit?  No      Plan:     I have personally reviewed and noted the following in the patient's chart:   . Medical and social history . Use of alcohol, tobacco or illicit drugs  . Current medications and supplements . Functional ability and status . Nutritional status . Physical activity . Advanced directives . List of other physicians . Hospitalizations, surgeries, and ER visits in previous 12 months . Vitals . Screenings to  include cognitive, depression, and falls . Referrals and appointments  In addition, I have reviewed and discussed with patient certain preventive protocols, quality metrics, and best practice recommendations. A written personalized care plan for preventive services as well as general preventive health recommendations were provided to patient.     Kellie Simmering, LPN   [id number]   Nurse Notes:

## 2020-05-13 NOTE — Patient Instructions (Signed)
[name] , Thank you for taking time to come for your Medicare Wellness Visit. I appreciate your ongoing commitment to your health goals. Please review the following plan we discussed and let me know if I can assist you in the future.   Screening recommendations/referrals: Colonoscopy: completed 12/13/2017 Recommended yearly ophthalmology/optometry visit for glaucoma screening and checkup Recommended yearly dental visit for hygiene and checkup  Vaccinations: Influenza vaccine: due Pneumococcal vaccine: completed 06/25/2015 Tdap vaccine: completed 06/16/2018 Shingles vaccine: discussed   Covid-19:  11/26/2019, 11/04/2019  Advanced directives: Advance directive discussed with you today.    Conditions/risks identified: none  Next appointment: Follow up in one year for your annual wellness visit.   Preventive Care 12 Years and Older, Male Preventive care refers to lifestyle choices and visits with your health care provider that can promote health and wellness. What does preventive care include?  A yearly physical exam. This is also called an annual well check.  Dental exams once or twice a year.  Routine eye exams. Ask your health care provider how often you should have your eyes checked.  Personal lifestyle choices, including:  Daily care of your teeth and gums.  Regular physical activity.  Eating a healthy diet.  Avoiding tobacco and drug use.  Limiting alcohol use.  Practicing safe sex.  Taking low doses of aspirin every day.  Taking vitamin and mineral supplements as recommended by your health care provider. What happens during an annual well check? The services and screenings done by your health care provider during your annual well check will depend on your age, overall health, lifestyle risk factors, and family history of disease. Counseling  Your health care provider may ask you questions about your:  Alcohol use.  Tobacco use.  Drug use.  Emotional  well-being.  Home and relationship well-being.  Sexual activity.  Eating habits.  History of falls.  Memory and ability to understand (cognition).  Work and work Statistician. Screening  You may have the following tests or measurements:  Height, weight, and BMI.  Blood pressure.  Lipid and cholesterol levels. These may be checked every 5 years, or more frequently if you are over 69 years old.  Skin check.  Lung cancer screening. You may have this screening every year starting at age 43 if you have a 30-pack-year history of smoking and currently smoke or have quit within the past 15 years.  Fecal occult blood test (FOBT) of the stool. You may have this test every year starting at age 76.  Flexible sigmoidoscopy or colonoscopy. You may have a sigmoidoscopy every 5 years or a colonoscopy every 10 years starting at age 19.  Prostate cancer screening. Recommendations will vary depending on your family history and other risks.  Hepatitis C blood test.  Hepatitis B blood test.  Sexually transmitted disease (STD) testing.  Diabetes screening. This is done by checking your blood sugar (glucose) after you have not eaten for a while (fasting). You may have this done every 1-3 years.  Abdominal aortic aneurysm (AAA) screening. You may need this if you are a current or former smoker.  Osteoporosis. You may be screened starting at age 102 if you are at high risk. Talk with your health care provider about your test results, treatment options, and if necessary, the need for more tests. Vaccines  Your health care provider may recommend certain vaccines, such as:  Influenza vaccine. This is recommended every year.  Tetanus, diphtheria, and acellular pertussis (Tdap, Td) vaccine. You may need  a Td booster every 10 years.  Zoster vaccine. You may need this after age 53.  Pneumococcal 13-valent conjugate (PCV13) vaccine. One dose is recommended after age 68.  Pneumococcal  polysaccharide (PPSV23) vaccine. One dose is recommended after age 20. Talk to your health care provider about which screenings and vaccines you need and how often you need them. This information is not intended to replace advice given to you by your health care provider. Make sure you discuss any questions you have with your health care provider. Document Released: 09/27/2015 Document Revised: 05/20/2016 Document Reviewed: 07/02/2015 Elsevier Interactive Patient Education  2017 [employer] Prevention in the Home Falls can cause injuries. They can happen to people of all ages. There are many things you can do to make your home safe and to help prevent falls. What can I do on the outside of my home?  Regularly fix the edges of walkways and driveways and fix any cracks.  Remove anything that might make you trip as you walk through a door, such as a raised step or threshold.  Trim any bushes or trees on the path to your home.  Use bright outdoor lighting.  Clear any walking paths of anything that might make someone trip, such as rocks or tools.  Regularly check to see if handrails are loose or broken. Make sure that both sides of any steps have handrails.  Any raised decks and porches should have guardrails on the edges.  Have any leaves, snow, or ice cleared regularly.  Use sand or salt on walking paths during winter.  Clean up any spills in your garage right away. This includes oil or grease spills. What can I do in the bathroom?  Use night lights.  Install grab bars by the toilet and in the tub and shower. Do not use towel bars as grab bars.  Use non-skid mats or decals in the tub or shower.  If you need to sit down in the shower, use a plastic, non-slip stool.  Keep the floor dry. Clean up any water that spills on the floor as soon as it happens.  Remove soap buildup in the tub or shower regularly.  Attach bath mats securely with double-sided non-slip rug  tape.  Do not have throw rugs and other things on the floor that can make you trip. What can I do in the bedroom?  Use night lights.  Make sure that you have a light by your bed that is easy to reach.  Do not use any sheets or blankets that are too big for your bed. They should not hang down onto the floor.  Have a firm chair that has side arms. You can use this for support while you get dressed.  Do not have throw rugs and other things on the floor that can make you trip. What can I do in the kitchen?  Clean up any spills right away.  Avoid walking on wet floors.  Keep items that you use a lot in easy-to-reach places.  If you need to reach something above you, use a strong step stool that has a grab bar.  Keep electrical cords out of the way.  Do not use floor polish or wax that makes floors slippery. If you must use wax, use non-skid floor wax.  Do not have throw rugs and other things on the floor that can make you trip. What can I do with my stairs?  Do not leave any items on the  stairs.  Make sure that there are handrails on both sides of the stairs and use them. Fix handrails that are broken or loose. Make sure that handrails are as long as the stairways.  Check any carpeting to make sure that it is firmly attached to the stairs. Fix any carpet that is loose or worn.  Avoid having throw rugs at the top or bottom of the stairs. If you do have throw rugs, attach them to the floor with carpet tape.  Make sure that you have a light switch at the top of the stairs and the bottom of the stairs. If you do not have them, ask someone to add them for you. What else can I do to help prevent falls?  Wear shoes that:  Do not have high heels.  Have rubber bottoms.  Are comfortable and fit you well.  Are closed at the toe. Do not wear sandals.  If you use a stepladder:  Make sure that it is fully opened. Do not climb a closed stepladder.  Make sure that both sides of the  stepladder are locked into place.  Ask someone to hold it for you, if possible.  Clearly mark and make sure that you can see:  Any grab bars or handrails.  First and last steps.  Where the edge of each step is.  Use tools that help you move around (mobility aids) if they are needed. These include:  Canes.  Walkers.  Scooters.  Crutches.  Turn on the lights when you go into a dark area. Replace any light bulbs as soon as they burn out.  Set up your furniture so you have a clear path. Avoid moving your furniture around.  If any of your floors are uneven, fix them.  If there are any pets around you, be aware of where they are.  Review your medicines with your doctor. Some medicines can make you feel dizzy. This can increase your chance of falling. Ask your doctor what other things that you can do to help prevent falls. This information is not intended to replace advice given to you by your health care provider. Make sure you discuss any questions you have with your health care provider. Document Released: 06/27/2009 Document Revised: 02/06/2016 Document Reviewed: 10/05/2014 Elsevier Interactive Patient Education  2017 [employer].

## 2020-05-15 ENCOUNTER — Ambulatory Visit: Payer: Medicare Other

## 2020-05-29 ENCOUNTER — Telehealth: Payer: Self-pay

## 2020-05-30 ENCOUNTER — Ambulatory Visit: Payer: Medicare Other | Admitting: Nurse Practitioner

## 2020-06-04 ENCOUNTER — Ambulatory Visit (INDEPENDENT_AMBULATORY_CARE_PROVIDER_SITE_OTHER): Payer: Medicare Other | Admitting: Nurse Practitioner

## 2020-06-04 ENCOUNTER — Other Ambulatory Visit: Payer: Self-pay

## 2020-06-04 ENCOUNTER — Encounter: Payer: Self-pay | Admitting: Nurse Practitioner

## 2020-06-04 VITALS — BP 135/72 | HR 88 | Temp 98.5°F | Resp 18 | Ht 71.0 in | Wt 178.0 lb

## 2020-06-04 DIAGNOSIS — E538 Deficiency of other specified B group vitamins: Secondary | ICD-10-CM | POA: Diagnosis not present

## 2020-06-04 DIAGNOSIS — E1122 Type 2 diabetes mellitus with diabetic chronic kidney disease: Secondary | ICD-10-CM

## 2020-06-04 DIAGNOSIS — E1169 Type 2 diabetes mellitus with other specified complication: Secondary | ICD-10-CM

## 2020-06-04 DIAGNOSIS — I10 Essential (primary) hypertension: Secondary | ICD-10-CM

## 2020-06-04 DIAGNOSIS — Z794 Long term (current) use of insulin: Secondary | ICD-10-CM

## 2020-06-04 DIAGNOSIS — E1165 Type 2 diabetes mellitus with hyperglycemia: Secondary | ICD-10-CM

## 2020-06-04 DIAGNOSIS — Z23 Encounter for immunization: Secondary | ICD-10-CM

## 2020-06-04 DIAGNOSIS — E785 Hyperlipidemia, unspecified: Secondary | ICD-10-CM

## 2020-06-04 DIAGNOSIS — I152 Hypertension secondary to endocrine disorders: Secondary | ICD-10-CM

## 2020-06-04 DIAGNOSIS — E1159 Type 2 diabetes mellitus with other circulatory complications: Secondary | ICD-10-CM

## 2020-06-04 DIAGNOSIS — N183 Chronic kidney disease, stage 3 unspecified: Secondary | ICD-10-CM

## 2020-06-04 DIAGNOSIS — K219 Gastro-esophageal reflux disease without esophagitis: Secondary | ICD-10-CM

## 2020-06-04 NOTE — Patient Instructions (Addendum)
Nice seeing you today, [name]!  We will call you tomorrow with your lab results.  Be sure to let us know if you do not receive your atenolol in the mail today.  Also be sure to reach out to your Cardiologist to set up a follow up appointment.  Vitamin B12 Deficiency Vitamin B12 deficiency occurs when the body does not have enough vitamin B12, which is an important vitamin. The body needs this vitamin:  To make red blood cells.  To make DNA. This is the genetic material inside cells.  To help the nerves work properly so they can carry messages from the brain to the body. Vitamin B12 deficiency can cause various health problems, such as a low red blood cell count (anemia) or nerve damage. What are the causes? This condition may be caused by:  Not eating enough foods that contain vitamin B12.  Not having enough stomach acid and digestive fluids to properly absorb vitamin B12 from the food that you eat.  Certain digestive system diseases that make it hard to absorb vitamin B12. These diseases include Crohn's disease, chronic pancreatitis, and cystic fibrosis.  A condition in which the body does not make enough of a protein (intrinsic factor), resulting in too few red blood cells (pernicious anemia).  Having a surgery in which part of the stomach or small intestine is removed.  Taking certain medicines that make it hard for the body to absorb vitamin B12. These medicines include: ? Heartburn medicines (antacids and proton pump inhibitors). ? Certain antibiotic medicines. ? Some medicines that are used to treat diabetes, tuberculosis, gout, or high cholesterol. What increases the risk? The following factors may make you more likely to develop a B12 deficiency:  Being older than age 70.  Eating a vegetarian or vegan diet, especially while you are pregnant.  Eating a poor diet while you are pregnant.  Taking certain medicines.  Having alcoholism. What are the signs or symptoms? In  some cases, there are no symptoms of this condition. If the condition leads to anemia or nerve damage, various symptoms can occur, such as:  Weakness.  Fatigue.  Loss of appetite.  Weight loss.  Numbness or tingling in your hands and feet.  Redness and burning of the tongue.  Confusion or memory problems.  Depression.  Sensory problems, such as color blindness, ringing in the ears, or loss of taste.  Diarrhea or constipation.  Trouble walking. If anemia is severe, symptoms can include:  Shortness of breath.  Dizziness.  Rapid heart rate (tachycardia). How is this diagnosed? This condition may be diagnosed with a blood test to measure the level of vitamin B12 in your blood. You may also have other tests, including:  A group of tests that measure certain characteristics of blood cells (complete blood count, CBC).  A blood test to measure intrinsic factor.  A procedure where a thin tube with a camera on the end is used to look into your stomach or intestines (endoscopy). Other tests may be needed to discover the cause of B12 deficiency. How is this treated? Treatment for this condition depends on the cause. This condition may be treated by:  Changing your eating and drinking habits, such as: ? Eating more foods that contain vitamin B12. ? Drinking less alcohol or no alcohol.  Getting vitamin B12 injections.  Taking vitamin B12 supplements. Your health care provider will tell you which dosage is best for you. Follow these instructions at home: Eating and drinking   Eat  lots of healthy foods that contain vitamin B12, including: ? Meats and poultry. This includes beef, pork, chicken, [place], and organ meats, such as liver. ? Seafood. This includes clams, rainbow trout, salmon, tuna, and haddock. ? Eggs. ? Cereal and dairy products that are fortified. This means that vitamin B12 has been added to the food. Check the label on the package to see if the food is  fortified. The items listed above may not be a complete list of recommended foods and beverages. Contact a dietitian for more information. General instructions  Get any injections that are prescribed by your health care provider.  Take supplements only as told by your health care provider. Follow the directions carefully.  Do not drink alcohol if your health care provider tells you not to. In some cases, you may only be asked to limit alcohol use.  Keep all follow-up visits as told by your health care provider. This is important. Contact a health care provider if:  Your symptoms come back. Get help right away if you:  Develop shortness of breath.  Have a rapid heart rate.  Have chest pain.  Become dizzy or lose consciousness. Summary  Vitamin B12 deficiency occurs when the body does not have enough vitamin B12.  The main causes of vitamin B12 deficiency include dietary deficiency, digestive diseases, pernicious anemia, and having a surgery in which part of the stomach or small intestine is removed.  In some cases, there are no symptoms of this condition. If the condition leads to anemia or nerve damage, various symptoms can occur, such as weakness, shortness of breath, and numbness.  Treatment may include getting vitamin B12 injections or taking vitamin B12 supplements. Eat lots of healthy foods that contain vitamin B12. This information is not intended to replace advice given to you by your health care provider. Make sure you discuss any questions you have with your health care provider. Document Revised: 02/17/2019 Document Reviewed: 05/10/2018 Elsevier Patient Education  2020 [employer].

## 2020-06-04 NOTE — Assessment & Plan Note (Signed)
Chronic, ongoing.  Reports great control with pantoprazole 40 mg and aware of foods that trigger acid reflux.  Continue pantoprazole daily.  CBC checked today.

## 2020-06-04 NOTE — Assessment & Plan Note (Addendum)
Kidney function returned to normal last check on labs.  Will recheck BMP today.  Continue ace inhibitor for renal protection.

## 2020-06-04 NOTE — Assessment & Plan Note (Signed)
Chronic, ongoing.  BP very slightly above goal today in office.  Continue atenolol and ramipril 1.25 mg daily and per cardiology collaboration.  Continue monitoring BP at home and notify office if consistently >130/80.  DASH diet handout given.  BMP checked today.  Return in 3 months or sooner if needs arise.

## 2020-06-04 NOTE — Assessment & Plan Note (Signed)
Chronic, stable on Crestor 40 mg.  Lipids checked today.

## 2020-06-04 NOTE — Assessment & Plan Note (Addendum)
Chronic, ongoing.  Follows with Endocrinology, due for follow up later this month.  Continue medication regimen as prescribed by endocrinologist.  Continue to monitor blood sugar at home twice daily.  Due for eye exam - will contact ophthalmologist to schedule.  Follow up in 3 months.

## 2020-06-04 NOTE — Progress Notes (Signed)
BP 135/72 (BP Location: Left Arm, Patient Position: Sitting, Cuff Size: Normal)   Pulse 88   Temp 98.5 F (36.9 C) (Oral)   Resp 18   Ht 5\' 11"  (1.803 m)   Wt 178 lb (80.7 kg)   SpO2 98%   BMI 24.83 kg/m    Subjective:    Patient ID: [name], male    DOB: Sep 14, 1951, 69 y.o.   MRN: [id number]  HPI: [name] is a 69 y.o. male presenting for follow up  Chief Complaint  Patient presents with  . Diabetes   HYPERTENSION / HYPERLIPIDEMIA Satisfied with current treatment? yes Duration of hypertension: chronic BP monitoring frequency: not checking BP range: not checking BP medication side effects: no Past BP meds: ramipril 1.25, atenolol 50 Duration of hyperlipidemia: chronic Cholesterol medication side effects: no Cholesterol supplements: none Past cholesterol medications: rosuvastatin Medication compliance: excellent compliance Aspirin: yes Recent stressors: no Recurrent headaches: no Visual changes: no Palpitations: no Dyspnea: no Chest pain: no Lower extremity edema: no Dizzy/lightheaded: no  DIABETES Sees Endocrinologist for diabetes - upcoming appointment later in September Hypoglycemic episodes:no Polydipsia/polyuria: no Visual disturbance: no Chest pain: no Paresthesias: no Glucose Monitoring: yes  Accucheck frequency: Daily  Fasting glucose: 118-150 Taking Insulin?: yes  Long acting insulin: 70 units at night Blood Pressure Monitoring: not checking Retinal Examination: needs to make appointment Foot Exam: Up to Date Diabetic Education: Completed Pneumovax: Up to Date Influenza: Up to Date Aspirin: yes  GERD GERD control status: controlled  Satisfied with current treatment? yes Heartburn frequency: rarely Medication side effects: no  Medication compliance: excellent Previous GERD medications: pantoprazole Dysphagia: no Odynophagia:  no Hematemesis: no Blood in stool: no EGD: yes  INSOMNIA Duration: chronic Satisfied  with sleep quality: yes Difficulty falling asleep: no Difficulty staying asleep: no Waking a few hours after sleep onset: no Early morning awakenings: no Daytime hypersomnolence: no Wakes feeling refreshed: no Good sleep hygiene: yes Apnea: no Snoring: no Depressed/anxious mood: no Recent stress: no Restless legs/nocturnal leg cramps: no Chronic pain/arthritis: no History of sleep study: no Treatments attempted: elavil 20 mg nightly   Allergies  Allergen Reactions  . Trulicity [Dulaglutide] Nausea Only  . Penicillins Rash    Childhood. Did not require medical care. Childhood. Did not require medical care. Childhood. Did not require medical care.   Outpatient Encounter Medications as of 06/04/2020  Medication Sig  . amitriptyline (ELAVIL) 10 MG tablet Take 2 tablets (20 mg total) by mouth at bedtime.  Marland Kitchen aspirin EC 81 MG tablet Take 81 mg by mouth daily.   Marland Kitchen atenolol (TENORMIN) 50 MG tablet Take 50 mg by mouth daily.  . clopidogrel (PLAVIX) 75 MG tablet Take 75 mg by mouth daily.   . insulin degludec (TRESIBA FLEXTOUCH) 100 UNIT/ML FlexTouch Pen Inject 70 Units into the skin daily.   . metFORMIN (GLUCOPHAGE) 500 MG tablet Take 2 tablets (1,000 mg total) by mouth 2 (two) times daily with a meal.  . pantoprazole (PROTONIX) 40 MG tablet TAKE 1 TABLET BY MOUTH EVERY DAY  . ramipril (ALTACE) 1.25 MG capsule Take 1.25 mg by mouth daily.  . rosuvastatin (CRESTOR) 40 MG tablet Take 1 tablet (40 mg total) by mouth daily.  . vitamin B-12 (CYANOCOBALAMIN) 1000 MCG tablet Take 1,000 mcg by mouth daily.  . nitroGLYCERIN (NITROSTAT) 0.4 MG SL tablet Place 0.4 mg under the tongue every 5 (five) minutes as needed.  (Patient not taking: Reported on 06/04/2020)   No facility-administered encounter medications  on file as of 06/04/2020.   Patient Active Problem List   Diagnosis Date Noted  . Vitamin B12 deficiency 02/29/2020  . Pneumonia of right lower lobe due to infectious organism 02/28/2020   . CKD stage 3 due to type 2 diabetes mellitus ([place]) 02/18/2020  . Erectile dysfunction 01/05/2020  . History of hip replacement 07/25/2019  . Hyperlipidemia associated with type 2 diabetes mellitus ([place]) 05/24/2018  . Hypertension associated with type 2 diabetes mellitus ([place]) 05/24/2018  . Type 2 diabetes mellitus with hyperglycemia, with long-term current use of insulin ([place]) 05/24/2018  . Benign fibroma of prostate 03/05/2014  . BPH (benign prostatic hyperplasia) 03/05/2014  . Insomnia 03/05/2014  . Arteriosclerosis of coronary artery 04/12/2012  . Colon polyp 04/12/2012  . Spinal stenosis 04/12/2012  . ASHD (arteriosclerotic heart disease) 04/12/2012  . GERD (gastroesophageal reflux disease) 04/12/2012  . Eye globe prosthesis 04/12/2012   Past Medical History:  Diagnosis Date  . Acid reflux 04/12/2012  . Arteriosclerosis of coronary artery 04/12/2012   Overview:  1.  Atherosclerotic coronary artery disease.    a.  normal LV function.  Coronary arteriography 5/98 revealed 50% LAD lesion, 95% lesion in large diagonal branch. Left circumflex was normal.  25% proximal RCA lesion.  50% lesion in the PDA.    b.  PTCA of the first diagonal and placement of three multi-link stents.    c.  recent ETT Cardiolite revealed low probability for ischemia.     d.  cardiac catheterization on 05/18/97 per [name] revealed 80% LAD, 70% proximal, 90% mid first AL, 30% RCA.     e.  status post coronary bypass grafting x 2 per [name].    f.  recurrent chest pain with exertion, equivocal ETT.    g.  repeat cardiac catheterization revealed an occluded vein graft.     h.  medical therapy.    i.  ETT Myoview showed borderline changes.     j.  cardiac catheterization 8/02 revealed no significant change in anatomy from previous cath.     k. cardiac cath 06/19/05 revealed patent left internal mammary artery to occluded LAD. Saphenous vein to D1 was chronically occ  . Benign essential HTN 11/19/2015  . Benign  fibroma of prostate 03/05/2014  . Cannot sleep 03/05/2014  . Colon polyp 04/12/2012  . HLD (hyperlipidemia) 11/19/2015  . Presence of artificial eye 04/12/2012  . Spinal stenosis 04/12/2012  . Type 2 diabetes mellitus ([place]) 03/05/2014   Relevant past medical, surgical, family and social history reviewed and updated as indicated. Interim medical history since our last visit reviewed.  Review of Systems  Constitutional: Negative.  Negative for activity change, appetite change, diaphoresis, fatigue and fever.  Eyes: Negative.  Negative for visual disturbance.  Respiratory: Negative.  Negative for cough, shortness of breath and wheezing.   Cardiovascular: Negative.  Negative for chest pain, palpitations and leg swelling.  Gastrointestinal: Negative.  Negative for abdominal pain, blood in stool, constipation, diarrhea, nausea and vomiting.  Endocrine: Negative.   Musculoskeletal: Negative.   Skin: Negative.  Negative for rash.  Neurological: Negative.  Negative for dizziness, weakness, light-headedness and headaches.  Psychiatric/Behavioral: Negative.  Negative for confusion, self-injury and sleep disturbance.    Per HPI unless specifically indicated above     Objective:    BP 135/72 (BP Location: Left Arm, Patient Position: Sitting, Cuff Size: Normal)   Pulse 88   Temp 98.5 F (36.9 C) (Oral)   Resp 18   Ht 5\' 11"  (  1.803 m)   Wt 178 lb (80.7 kg)   SpO2 98%   BMI 24.83 kg/m   Wt Readings from Last 3 Encounters:  06/04/20 178 lb (80.7 kg)  05/13/20 180 lb (81.6 kg)  02/28/20 170 lb (77.1 kg)    Physical Exam Vitals and nursing note reviewed.  Constitutional:      General: He is not in acute distress.    Appearance: Normal appearance.  Eyes:     General: No scleral icterus.    Extraocular Movements: Extraocular movements intact.  Neck:     Vascular: No carotid bruit.  Cardiovascular:     Rate and Rhythm: Normal rate.     Heart sounds: Normal heart sounds. No murmur heard.    Pulmonary:     Effort: Pulmonary effort is normal. No respiratory distress.     Breath sounds: Normal breath sounds. No wheezing or rhonchi.  Abdominal:     General: Abdomen is flat. Bowel sounds are normal. There is no distension.  Musculoskeletal:        General: Normal range of motion.     Cervical back: Normal range of motion.     Right lower leg: No edema.     Left lower leg: No edema.  Skin:    General: Skin is warm and dry.     Coloration: Skin is not jaundiced or pale.  Neurological:     General: No focal deficit present.     Mental Status: He is alert and oriented to person, place, and time.     Motor: No weakness.     Gait: Gait normal.  Psychiatric:        Mood and Affect: Mood normal.        Behavior: Behavior normal.        Thought Content: Thought content normal.        Judgment: Judgment normal.       Assessment & Plan:   Problem List Items Addressed This Visit      Cardiovascular and Mediastinum   Hypertension associated with type 2 diabetes mellitus (HCC)    Chronic, ongoing.  BP very slightly above goal today in office.  Continue atenolol and ramipril 1.25 mg daily and per cardiology collaboration.  Continue monitoring BP at home and notify office if consistently >130/80.  DASH diet handout given.  BMP checked today.  Return in 3 months or sooner if needs arise.      Relevant Orders   Basic Metabolic Panel (BMET)   CBC with Differential/Platelet     Digestive   GERD (gastroesophageal reflux disease)    Chronic, ongoing.  Reports great control with pantoprazole 40 mg and aware of foods that trigger acid reflux.  Continue pantoprazole daily.  CBC checked today.        Endocrine   Hyperlipidemia associated with type 2 diabetes mellitus (HCC)    Chronic, stable on Crestor 40 mg.  Lipids checked today.       Relevant Orders   Lipid Panel w/o Chol/HDL Ratio   Basic Metabolic Panel (BMET)   CBC with Differential/Platelet   Type 2 diabetes mellitus  with hyperglycemia, with long-term current use of insulin (HCC)    Chronic, ongoing.  Follows with Endocrinology, due for follow up later this month.  Continue medication regimen as prescribed by endocrinologist.  Continue to monitor blood sugar at home twice daily.  Due for eye exam - will contact ophthalmologist to schedule.  Follow up in 3 months.  CKD stage 3 due to type 2 diabetes mellitus ([place])    Kidney function returned to normal last check on labs.  Will recheck BMP today.  Continue ace inhibitor for renal protection.      Relevant Orders   Basic Metabolic Panel (BMET)   CBC with Differential/Platelet     Other   Vitamin B12 deficiency   Relevant Orders   Vitamin B12    Other Visit Diagnoses    Flu vaccine need    -  Primary   Relevant Orders   Flu Vaccine QUAD High Dose(Fluad) (Completed)       Follow up plan: Return in about 3 months (around 09/03/2020) for HTN/HLD, DM vitamin B12 follow up.

## 2020-06-05 LAB — BASIC METABOLIC PANEL
BUN/Creatinine Ratio: 12 (ref 10–24)
BUN: 13 mg/dL (ref 8–27)
CO2: 23 mmol/L (ref 20–29)
Calcium: 9.4 mg/dL (ref 8.6–10.2)
Chloride: 100 mmol/L (ref 96–106)
Creatinine, Ser: 1.08 mg/dL (ref 0.76–1.27)
GFR calc Af Amer: 79 mL/min/{1.73_m2} (ref >59)
GFR calc non Af Amer: 69 mL/min/{1.73_m2} (ref >59)
Glucose: 167 mg/dL — ABNORMAL HIGH (ref 65–99)
Potassium: 4.9 mmol/L (ref 3.5–5.2)
Sodium: 137 mmol/L (ref 134–144)

## 2020-06-05 LAB — CBC WITH DIFFERENTIAL/PLATELET
Basophils Absolute: 0 10*3/uL (ref 0.0–0.2)
Basos: 1 %
EOS (ABSOLUTE): 0.3 10*3/uL (ref 0.0–0.4)
Eos: 6 %
Hematocrit: 42.2 % (ref 37.5–51.0)
Hemoglobin: 14.2 g/dL (ref 13.0–17.7)
Immature Grans (Abs): 0 10*3/uL (ref 0.0–0.1)
Immature Granulocytes: 1 %
Lymphocytes Absolute: 1.2 10*3/uL (ref 0.7–3.1)
Lymphs: 28 %
MCH: 31.3 pg (ref 26.6–33.0)
MCHC: 33.6 g/dL (ref 31.5–35.7)
MCV: 93 fL (ref 79–97)
Monocytes Absolute: 0.3 10*3/uL (ref 0.1–0.9)
Monocytes: 8 %
Neutrophils Absolute: 2.5 10*3/uL (ref 1.4–7.0)
Neutrophils: 56 %
Platelets: 172 10*3/uL (ref 150–450)
RBC: 4.53 x10E6/uL (ref 4.14–5.80)
RDW: 13.2 % (ref 11.6–15.4)
WBC: 4.4 10*3/uL (ref 3.4–10.8)

## 2020-06-05 LAB — LIPID PANEL W/O CHOL/HDL RATIO
Cholesterol, Total: 175 mg/dL (ref 100–199)
HDL: 35 mg/dL — ABNORMAL LOW (ref >39)
LDL Chol Calc (NIH): 53 mg/dL (ref 0–99)
Triglycerides: 592 mg/dL (ref 0–149)
VLDL Cholesterol Cal: 87 mg/dL — ABNORMAL HIGH (ref 5–40)

## 2020-06-05 LAB — VITAMIN B12: Vitamin B-12: 523 pg/mL (ref 232–1245)

## 2020-06-07 ENCOUNTER — Telehealth: Payer: Self-pay | Admitting: Nurse Practitioner

## 2020-06-07 ENCOUNTER — Encounter: Payer: Self-pay | Admitting: Nurse Practitioner

## 2020-06-07 MED ORDER — OMEGA-3-ACID ETHYL ESTERS 1 G PO CAPS
2.0000 g | ORAL_CAPSULE | Freq: Two times a day (BID) | ORAL | 1 refills | Status: DC
Start: 2020-06-07 — End: 2022-05-21

## 2020-06-07 NOTE — Telephone Encounter (Signed)
Called patient to discuss lab results - everything looked pretty good with exception of triglyceride levels.  Patient was fasting during blood drawn.  Discussed treatment options including Lovaza 4g daily - patient will to try.  Encouraged close follow up with Endocrinologist and with [place] in 3 months or sooner if needs arise.

## 2020-06-11 NOTE — Telephone Encounter (Signed)
Routing to provider to advise.  

## 2020-06-11 NOTE — Telephone Encounter (Signed)
Pt called stating that the prescription for Lovaza was too expensive. He is requesting to have something else sent in for him. Please advise.        [id number] [address], [place] AT [facility] OF [address]  [place] [address]  Phone: [phone] Fax: [phone]  Hours: Not open 24 hours

## 2020-06-12 NOTE — Telephone Encounter (Signed)
Patient notified of [name]'s message. Will call back to let us know what is covered better.

## 2020-06-12 NOTE — Telephone Encounter (Signed)
Please see if patient can call his insurance to ask what would be covered.  According to Epic, it should be covered.

## 2020-07-05 ENCOUNTER — Telehealth: Payer: Self-pay

## 2020-07-05 ENCOUNTER — Telehealth: Payer: Self-pay | Admitting: Pharmacist

## 2020-07-05 NOTE — Progress Notes (Deleted)
Chronic Care Management Pharmacy  Name: [name]  MRN: [id number] DOB: December 03, 1950   Chief Complaint/ HPI  [name],  69 y.o. , male presents for their {Initial/Follow-up:3041532} CCM visit with the clinical pharmacist {CHL HP Upstream Pharm visit OFBP:[id number]}.  PCP : [name], NP Patient Care Team: [name], NP as PCP - General (Nurse Practitioner)  Their chronic conditions include: {USCCMDZASSESSMENTOPTIONS:23563}   Office Visits: 06/04/20- Noemi Chapel, NP - TG 592, Lovaza?? 7/21//21- Catie Darnelle Maffucci, PharmD -CCM- p/u [name] at Endo- 70 u daily PAP Novo through 08/13/20, Discuss SGLt-2 with endo  Consult Visit:  07/02/20- Deidre Ala blackwood, NP- A1C 7.2, metformin 1000mg  bid, add Jardiance 25 mg  Allergies  Allergen Reactions  . Trulicity [Dulaglutide] Nausea Only  . Penicillins Rash    Childhood. Did not require medical care. Childhood. Did not require medical care. Childhood. Did not require medical care.    Medications: Outpatient Encounter Medications as of 07/05/2020  Medication Sig  . amitriptyline (ELAVIL) 10 MG tablet Take 2 tablets (20 mg total) by mouth at bedtime.  Marland Kitchen aspirin EC 81 MG tablet Take 81 mg by mouth daily.   Marland Kitchen atenolol (TENORMIN) 50 MG tablet Take 50 mg by mouth daily.  . clopidogrel (PLAVIX) 75 MG tablet Take 75 mg by mouth daily.   . insulin degludec (TRESIBA FLEXTOUCH) 100 UNIT/ML FlexTouch Pen Inject 70 Units into the skin daily.   . metFORMIN (GLUCOPHAGE) 500 MG tablet Take 2 tablets (1,000 mg total) by mouth 2 (two) times daily with a meal.  . nitroGLYCERIN (NITROSTAT) 0.4 MG SL tablet Place 0.4 mg under the tongue every 5 (five) minutes as needed.  (Patient not taking: Reported on 06/04/2020)  . omega-3 acid ethyl esters (LOVAZA) 1 g capsule Take 2 capsules (2 g total) by mouth 2 (two) times daily.  . pantoprazole (PROTONIX) 40 MG tablet TAKE 1 TABLET BY MOUTH EVERY DAY  . ramipril (ALTACE) 1.25 MG  capsule Take 1.25 mg by mouth daily.  . rosuvastatin (CRESTOR) 40 MG tablet Take 1 tablet (40 mg total) by mouth daily.  . vitamin B-12 (CYANOCOBALAMIN) 1000 MCG tablet Take 1,000 mcg by mouth daily.   No facility-administered encounter medications on file as of 07/05/2020.    Wt Readings from Last 3 Encounters:  06/04/20 178 lb (80.7 kg)  05/13/20 180 lb (81.6 kg)  02/28/20 170 lb (77.1 kg)    Current Diagnosis/Assessment:    Goals Addressed   None     Diabetes   A1c goal {A1c goals:23924}  Recent Relevant Labs: Lab Results  Component Value Date/Time   HGBA1C 7.2 11/06/2019 12:00 AM   MICROALBUR 80 (H) 02/28/2020 11:53 AM    Last diabetic Eye exam: No results found for: HMDIABEYEEXA  Last diabetic Foot exam: No results found for: HMDIABFOOTEX   Checking BG: {CHL HP Blood Glucose Monitoring Frequency:[phone]}  Recent FBG Readings: *** Recent pre-meal BG readings: *** Recent 2hr PP BG readings:  *** Recent HS BG readings: ***  Patient has failed these meds in past: *** Patient is currently {CHL Controlled/Uncontrolled:[phone]} on the following medications: . tresiba 70 u daily . Metformin 1000 mg bid . Jardiance 25 mg qd  We discussed: {CHL HP Upstream Pharmacy discussion:[phone]}  Plan  Continue {CHL HP Upstream Pharmacy [phone]}    Medication Management   Pt uses *** pharmacy for all medications Uses pill box? {Yes or If no, why not?:20788} Pt endorses ***% compliance  We discussed: {Pharmacy options:24294}  Plan  {  [place] Pharmacy PRAF:42552}    Follow up: *** month phone visit  ***

## 2020-07-05 NOTE — Progress Notes (Signed)
  Chronic Care Management   Outreach Note  07/05/2020 Name: Raider Valbuena MRN: [id number] DOB: 16-Mar-1951  Referred by: [name], NP Reason for referral : No chief complaint on file.   An unsuccessful telephone outreach was attempted today. The patient was referred to the pharmacist for assistance with care management and care coordination.   Follow Up Plan: Will ask care guide to outreach for reschedule.  Junita Push. Kenton Kingfisher PharmD, [facility] [phone]

## 2020-07-13 ENCOUNTER — Other Ambulatory Visit: Payer: Self-pay | Admitting: Nurse Practitioner

## 2020-07-13 NOTE — Telephone Encounter (Signed)
Requested Prescriptions  Pending Prescriptions Disp Refills  . pantoprazole (PROTONIX) 40 MG tablet [Pharmacy Med Name: PANTOPRAZOLE SOD DR 40 MG TAB] 90 tablet 0    Sig: TAKE 1 TABLET BY MOUTH EVERY DAY     Gastroenterology: Proton Pump Inhibitors Passed - 07/13/2020  8:46 AM      Passed - Valid encounter within last 12 months    Recent Outpatient Visits          1 month ago Flu vaccine need   [place], NP   4 months ago Type 2 diabetes mellitus with hyperglycemia, with long-term current use of insulin ([place])   [place], Henrine Screws T, NP   6 months ago Encounter to establish care   [place], Barbaraann Faster, NP      Future Appointments            In 1 month [place], Barbaraann Faster, NP [name]   In 10 months  [name]

## 2020-08-14 DIAGNOSIS — U071 COVID-19: Secondary | ICD-10-CM

## 2020-08-14 HISTORY — DX: COVID-19: U07.1

## 2020-08-23 ENCOUNTER — Telehealth: Payer: Medicare Other

## 2020-09-03 ENCOUNTER — Encounter: Payer: Self-pay | Admitting: Nurse Practitioner

## 2020-09-03 ENCOUNTER — Ambulatory Visit (INDEPENDENT_AMBULATORY_CARE_PROVIDER_SITE_OTHER): Payer: Medicare Other | Admitting: Nurse Practitioner

## 2020-09-03 ENCOUNTER — Other Ambulatory Visit: Payer: Self-pay

## 2020-09-03 VITALS — BP 114/63 | HR 71 | Temp 97.7°F | Ht 70.39 in | Wt 178.0 lb

## 2020-09-03 DIAGNOSIS — E1169 Type 2 diabetes mellitus with other specified complication: Secondary | ICD-10-CM | POA: Diagnosis not present

## 2020-09-03 DIAGNOSIS — E785 Hyperlipidemia, unspecified: Secondary | ICD-10-CM

## 2020-09-03 DIAGNOSIS — E1165 Type 2 diabetes mellitus with hyperglycemia: Secondary | ICD-10-CM | POA: Diagnosis not present

## 2020-09-03 DIAGNOSIS — I152 Hypertension secondary to endocrine disorders: Secondary | ICD-10-CM

## 2020-09-03 DIAGNOSIS — N183 Chronic kidney disease, stage 3 unspecified: Secondary | ICD-10-CM

## 2020-09-03 DIAGNOSIS — E1159 Type 2 diabetes mellitus with other circulatory complications: Secondary | ICD-10-CM | POA: Diagnosis not present

## 2020-09-03 DIAGNOSIS — E1122 Type 2 diabetes mellitus with diabetic chronic kidney disease: Secondary | ICD-10-CM

## 2020-09-03 DIAGNOSIS — F5101 Primary insomnia: Secondary | ICD-10-CM

## 2020-09-03 DIAGNOSIS — Z1159 Encounter for screening for other viral diseases: Secondary | ICD-10-CM

## 2020-09-03 DIAGNOSIS — Z794 Long term (current) use of insulin: Secondary | ICD-10-CM

## 2020-09-03 DIAGNOSIS — E538 Deficiency of other specified B group vitamins: Secondary | ICD-10-CM

## 2020-09-03 NOTE — Assessment & Plan Note (Signed)
Ongoing, stable.  Recheck BMP today and monitor.  Continue Ramipril for kidney protection.  Consider nephrology referral if decline ever noted.  Most recent A1c 7.2% with endocrinology, continue this collaboration.

## 2020-09-03 NOTE — Assessment & Plan Note (Signed)
Chronic, ongoing.  Continue Crestor, tolerating at this time.  Adjust dose as needed.  Lipid panel today.

## 2020-09-03 NOTE — Assessment & Plan Note (Signed)
Chronic, ongoing.  Followed by endocrinology with recent A1C 7.2%.  Continue current medication regimen as prescribed by endocrinology, notes reviewed.  CCM collaboration continues.  Urine ALB check next visit, up to date at this time.  Recommend he check BS at least twice a day, fasting and 2 hours after a meal.  Return in 3 months for follow-up.

## 2020-09-03 NOTE — Patient Instructions (Signed)

## 2020-09-03 NOTE — Assessment & Plan Note (Signed)
Chronic, stable on recent recheck.  Continue daily supplement and recheck level next visit. 

## 2020-09-03 NOTE — Progress Notes (Signed)
BP 114/63    Pulse 71    Temp 97.7 F (36.5 C) (Oral)    Ht 5' 10.39" (1.788 m)    Wt 178 lb (80.7 kg)    SpO2 100%    BMI 25.26 kg/m    Subjective:    Patient ID: [name], male    DOB: 05-23-1951, 69 y.o.   MRN: [id number]  HPI: [name] is a 69 y.o. male  Chief Complaint  Patient presents with   Diabetes   Hyperlipidemia   Hypertension   DIABETES Followed by endocrinology and last saw Dr. Honor Junes on 07/02/20 with A1C 7.2%. Was diagnosed 6-7 years ago.  Taking Metformin 1000 MG BID, Tresiba 70 units, and Jardiance 25 MG daily.  Has B12 deficiency, long term Metformin use, with initial result 174 and in September improved with supplement at 523.  Recently ran out of insulin, is on assistance program, got emergency supply.   Hypoglycemic episodes:no Polydipsia/polyuria: no Visual disturbance: no Chest pain: no Paresthesias: no Glucose Monitoring: yes             Accucheck frequency: Daily             Fasting glucose: 142 this morning -- sometimes gets into 200 range, but not often             Post prandial:             Evening:             Before meals: Taking Insulin?: yes             Long acting insulin: Tresiba 70 units             Short acting insulin: Blood Pressure Monitoring: not checking Retinal Examination: Not up to Date -- goes to [employer] Exam: Up to Date Pneumovax: Up to Date Influenza: Up to Date Aspirin: yes   HYPERTENSION / HYPERLIPIDEMIA Followed by cardiology and history of CABG in 1998, stent in 2006, catheterization in 2011.  Last saw Dr. Ubaldo Glassing with cardiology on 10/10/2018, per this note to continue dual anti-platelet therapy. Continues on Ramipril 1.25 MG, Atenolol 50 MG, ASA, and Plavix. Last echo in 2019 and 55% .  Also has NTG for angina pain, has not used in a long time. Taking Crestor for HLD. Satisfied with current treatment? yes Duration of hypertension: chronic BP monitoring frequency: not checking BP range:   BP medication side effects: no Duration of hyperlipidemia: chronic Cholesterol medication side effects: no Cholesterol supplements: none Medication compliance: good compliance Aspirin: yes Recent stressors: no Recurrent headaches: no Visual changes: no Palpitations: no Dyspnea: no Chest pain: no Lower extremity edema: no Dizzy/lightheaded: no   INSOMNIA Taking Amitriptyline 20 MG at bedtime, has taken Ambien in past. Duration: chronic Satisfied with sleep quality: no Difficulty falling asleep: no Difficulty staying asleep: yes Waking a few hours after sleep onset: yes Early morning awakenings: no Daytime hypersomnolence: no Wakes feeling refreshed: yes Good sleep hygiene: yes Apnea: no Snoring: no Depressed/anxious mood: no Recent stress: no Restless legs/nocturnal leg cramps: no Chronic pain/arthritis: no History of sleep study: no Treatments attempted: [place]   Relevant past medical, surgical, family and social history reviewed and updated as indicated. Interim medical history since our last visit reviewed. Allergies and medications reviewed and updated.  Review of Systems  Constitutional: Negative for activity change, diaphoresis, fatigue and fever.  Respiratory: Negative for cough, chest tightness, shortness of breath and wheezing.   Cardiovascular:  Negative for chest pain, palpitations and leg swelling.  Gastrointestinal: Negative.   Endocrine: Negative for polydipsia, polyphagia and polyuria.  Neurological: Negative.   Psychiatric/Behavioral: Negative.     Per HPI unless specifically indicated above     Objective:    BP 114/63    Pulse 71    Temp 97.7 F (36.5 C) (Oral)    Ht 5' 10.39" (1.788 m)    Wt 178 lb (80.7 kg)    SpO2 100%    BMI 25.26 kg/m   Wt Readings from Last 3 Encounters:  09/03/20 178 lb (80.7 kg)  06/04/20 178 lb (80.7 kg)  05/13/20 180 lb (81.6 kg)    Physical Exam Vitals and nursing note reviewed.  Constitutional:       General: He is awake. He is not in acute distress.    Appearance: He is well-developed and well-groomed. He is not ill-appearing.  HENT:     Head: Normocephalic and atraumatic.     Right Ear: Hearing normal. No drainage.     Left Ear: Hearing normal. No drainage.  Eyes:     General: Lids are normal.        Right eye: No discharge.        Left eye: No discharge.     Conjunctiva/sclera: Conjunctivae normal.     Pupils: Pupils are equal, round, and reactive to light.  Neck:     Thyroid: No thyromegaly.     Vascular: No carotid bruit.  Cardiovascular:     Rate and Rhythm: Normal rate and regular rhythm.     Heart sounds: Normal heart sounds, S1 normal and S2 normal. No murmur heard. No gallop.   Pulmonary:     Effort: Pulmonary effort is normal. No accessory muscle usage or respiratory distress.     Breath sounds: Normal breath sounds.  Abdominal:     General: Bowel sounds are normal.     Palpations: Abdomen is soft.  Musculoskeletal:        General: Normal range of motion.     Cervical back: Normal range of motion and neck supple.     Right lower leg: No edema.     Left lower leg: No edema.  Lymphadenopathy:     Cervical: No cervical adenopathy.  Skin:    General: Skin is warm and dry.     Capillary Refill: Capillary refill takes less than 2 seconds.  Neurological:     Mental Status: He is alert and oriented to person, place, and time.     Deep Tendon Reflexes: Reflexes are normal and symmetric.     Reflex Scores:      Brachioradialis reflexes are 2+ on the right side and 2+ on the left side.      Patellar reflexes are 2+ on the right side and 2+ on the left side. Psychiatric:        Attention and Perception: Attention normal.        Mood and Affect: Mood normal.        Speech: Speech normal.        Behavior: Behavior normal. Behavior is cooperative.        Thought Content: Thought content normal.    Results for orders placed or performed in visit on 06/04/20  Vitamin  B12  Result Value Ref Range   Vitamin B-12 523 232 - 1,245 pg/mL  Lipid Panel w/o Chol/HDL Ratio  Result Value Ref Range   Cholesterol, Total 175 100 - 199 mg/dL  Triglycerides 592 (HH) 0 - 149 mg/dL   HDL 35 (L) >39 mg/dL   VLDL Cholesterol Cal 87 (H) 5 - 40 mg/dL   LDL Chol Calc (NIH) 53 0 - 99 mg/dL  Basic Metabolic Panel (BMET)  Result Value Ref Range   Glucose 167 (H) 65 - 99 mg/dL   BUN 13 8 - 27 mg/dL   Creatinine, Ser 1.08 0.76 - 1.27 mg/dL   GFR calc non Af Amer 69 >59 mL/min/1.73   GFR calc Af Amer 79 >59 mL/min/1.73   BUN/Creatinine Ratio 12 10 - 24   Sodium 137 134 - 144 mmol/L   Potassium 4.9 3.5 - 5.2 mmol/L   Chloride 100 96 - 106 mmol/L   CO2 23 20 - 29 mmol/L   Calcium 9.4 8.6 - 10.2 mg/dL  CBC with Differential/Platelet  Result Value Ref Range   WBC 4.4 3.4 - 10.8 x10E3/uL   RBC 4.53 4.14 - 5.80 x10E6/uL   Hemoglobin 14.2 13.0 - 17.7 g/dL   Hematocrit 42.2 37.5 - 51.0 %   MCV 93 79 - 97 fL   MCH 31.3 26.6 - 33.0 pg   MCHC 33.6 31.5 - 35.7 g/dL   RDW 13.2 11.6 - 15.4 %   Platelets 172 150 - 450 x10E3/uL   Neutrophils 56 Not Estab. %   Lymphs 28 Not Estab. %   Monocytes 8 Not Estab. %   Eos 6 Not Estab. %   Basos 1 Not Estab. %   Neutrophils Absolute 2.5 1.4 - 7.0 x10E3/uL   Lymphocytes Absolute 1.2 0.7 - 3.1 x10E3/uL   Monocytes Absolute 0.3 0.1 - 0.9 x10E3/uL   EOS (ABSOLUTE) 0.3 0.0 - 0.4 x10E3/uL   Basophils Absolute 0.0 0.0 - 0.2 x10E3/uL   Immature Granulocytes 1 Not Estab. %   Immature Grans (Abs) 0.0 0.0 - 0.1 x10E3/uL      Assessment & Plan:   Problem List Items Addressed This Visit      Cardiovascular and Mediastinum   Hypertension associated with type 2 diabetes mellitus (HCC)    Chronic, ongoing with BP at goal.  Goal <130/80.  Will continue current medication regimen and cardiology collaboration.  Recommend he monitor BP at home at least a few mornings a week and document for provider + focus on DASH diet.  Could consider addition of  Amlodipine if elevation in SBP noted.  BMP today.  Recommend he return to cardiology for yearly visit.  Return in 3 months.      Relevant Medications   JARDIANCE 25 MG TABS tablet   Other Relevant Orders   Basic metabolic panel     Endocrine   Hyperlipidemia associated with type 2 diabetes mellitus (HCC)    Chronic, ongoing.  Continue Crestor, tolerating at this time.  Adjust dose as needed.  Lipid panel today.      Relevant Medications   JARDIANCE 25 MG TABS tablet   Other Relevant Orders   Lipid Panel w/o Chol/HDL Ratio   Type 2 diabetes mellitus with hyperglycemia, with long-term current use of insulin (HCC) - Primary    Chronic, ongoing.  Followed by endocrinology with recent A1C 7.2%.  Continue current medication regimen as prescribed by endocrinology, notes reviewed.  CCM collaboration continues.  Urine ALB check next visit, up to date at this time.  Recommend he check BS at least twice a day, fasting and 2 hours after a meal.  Return in 3 months for follow-up.      Relevant Medications  JARDIANCE 25 MG TABS tablet   CKD stage 3 due to type 2 diabetes mellitus (HCC)    Ongoing, stable.  Recheck BMP today and monitor.  Continue Ramipril for kidney protection.  Consider nephrology referral if decline ever noted.  Most recent A1c 7.2% with endocrinology, continue this collaboration.      Relevant Medications   JARDIANCE 25 MG TABS tablet     Other   Insomnia    Chronic, ongoing.  Would benefit from ongoing discontinuation of Ambien due to age >45.  Is tolerating Amitriptyline.  Will continue this regimen at 20 MG.  In future if ongoing issues, could consider Belsomra.  Continue to monitor and adjust regimen as needed.  Recommend sleep hygiene techniques.      Vitamin B12 deficiency    Chronic, stable on recent recheck.  Continue daily supplement and recheck level next visit.       Other Visit Diagnoses    Need for hepatitis C screening test       Hep C check today    Relevant Orders   Hepatitis C antibody       Follow up plan: Return in about 3 months (around 12/02/2020) for T2DM, HTN/HLD, INSOMNIA, GERD.

## 2020-09-03 NOTE — Assessment & Plan Note (Signed)
Chronic, ongoing with BP at goal.  Goal <130/80.  Will continue current medication regimen and cardiology collaboration.  Recommend he monitor BP at home at least a few mornings a week and document for provider + focus on DASH diet.  Could consider addition of Amlodipine if elevation in SBP noted.  BMP today.  Recommend he return to cardiology for yearly visit.  Return in 3 months.

## 2020-09-03 NOTE — Assessment & Plan Note (Signed)
Chronic, ongoing.  Would benefit from ongoing discontinuation of Ambien due to age >24.  Is tolerating Amitriptyline.  Will continue this regimen at 20 MG.  In future if ongoing issues, could consider Belsomra.  Continue to monitor and adjust regimen as needed.  Recommend sleep hygiene techniques.

## 2020-09-04 LAB — BASIC METABOLIC PANEL WITH GFR
BUN/Creatinine Ratio: 15 (ref 10–24)
BUN: 17 mg/dL (ref 8–27)
CO2: 19 mmol/L — ABNORMAL LOW (ref 20–29)
Calcium: 9.3 mg/dL (ref 8.6–10.2)
Chloride: 103 mmol/L (ref 96–106)
Creatinine, Ser: 1.15 mg/dL (ref 0.76–1.27)
GFR calc Af Amer: 74 mL/min/1.73
GFR calc non Af Amer: 64 mL/min/1.73
Glucose: 170 mg/dL — ABNORMAL HIGH (ref 65–99)
Potassium: 4.5 mmol/L (ref 3.5–5.2)
Sodium: 139 mmol/L (ref 134–144)

## 2020-09-04 LAB — LIPID PANEL W/O CHOL/HDL RATIO
Cholesterol, Total: 144 mg/dL (ref 100–199)
HDL: 29 mg/dL — ABNORMAL LOW
Triglycerides: 820 mg/dL (ref 0–149)

## 2020-09-04 LAB — HEPATITIS C ANTIBODY: Hep C Virus Ab: <0.1 {s_co_ratio} (ref 0.0–0.9)

## 2020-09-04 NOTE — Progress Notes (Signed)
Please let [name] know his labs have returned and overall stable with exception of ongoing elevation in triglycerides, now at 820.  Had he been fasting?  If not I would like to recheck these fasting over the next week or two, please schedule.  If he was fasting then I would like to start a medication called Fenofibrate which specifically works on lowering these + he would continue Rosuvastatin.  High triglycerides can place at risk for pancreatitis episode, which we do not want.  Please let me know answers to questions above and I will place orders needed.  Thank you.  Have a Merry Christmas!!

## 2020-09-05 ENCOUNTER — Other Ambulatory Visit: Payer: Self-pay | Admitting: Nurse Practitioner

## 2020-09-05 MED ORDER — FENOFIBRATE 145 MG PO TABS
145.0000 mg | ORAL_TABLET | Freq: Every day | ORAL | 4 refills | Status: DC
Start: 2020-09-05 — End: 2021-01-24

## 2020-09-11 ENCOUNTER — Telehealth: Payer: Self-pay

## 2020-09-11 ENCOUNTER — Other Ambulatory Visit: Payer: Self-pay | Admitting: Nurse Practitioner

## 2020-09-11 MED ORDER — BENZONATATE 100 MG PO CAPS
100.0000 mg | ORAL_CAPSULE | Freq: Three times a day (TID) | ORAL | 0 refills | Status: DC | PRN
Start: 2020-09-11 — End: 2021-10-05

## 2020-09-11 MED ORDER — ALBUTEROL SULFATE HFA 108 (90 BASE) MCG/ACT IN AERS
2.0000 | INHALATION_SPRAY | Freq: Four times a day (QID) | RESPIRATORY_TRACT | 0 refills | Status: DC | PRN
Start: 2020-09-11 — End: 2021-10-05

## 2020-09-11 NOTE — Telephone Encounter (Signed)
Please advise 

## 2020-09-11 NOTE — Telephone Encounter (Signed)
I have sent in cough medication and an Albuterol inhaler to use for cough.  I would like him to be schedule for next week for visit please.  Thank you.  If worsening symptoms before then, like SOB, chest pain, or increased fever immediately go to ER for assessment.

## 2020-09-11 NOTE — Telephone Encounter (Signed)
Copied from CRM [id number]. Topic: General - Other >> Sep 11, 2020  1:05 PM Dalphine Handing A wrote: Patient took at home covid test and tested positive for coid. Patient is currently awaiting pcr test results as well. Patient wants to know if Cannady can send any cough syrup into his pharmacy and would like a callback from [facility] nurse once cough medication is sent in. Please advise No available apt for this week can orders be placed or scheduled for next week.

## 2020-09-12 NOTE — Telephone Encounter (Signed)
Called patient to discuss lab results, no answer, left a voicemail for patient to return my call.  ?  ? ? ?

## 2020-09-12 NOTE — Telephone Encounter (Signed)
Pt is returning tiffany call transferred to office sabina epic not working properly

## 2020-09-12 NOTE — Telephone Encounter (Signed)
Patient notified, scheduled for 09/24/19, only available.

## 2020-09-23 ENCOUNTER — Other Ambulatory Visit: Payer: Self-pay

## 2020-09-23 ENCOUNTER — Ambulatory Visit (INDEPENDENT_AMBULATORY_CARE_PROVIDER_SITE_OTHER): Payer: Medicare Other | Admitting: Nurse Practitioner

## 2020-09-23 ENCOUNTER — Encounter: Payer: Self-pay | Admitting: Nurse Practitioner

## 2020-09-23 DIAGNOSIS — Z8616 Personal history of COVID-19: Secondary | ICD-10-CM | POA: Insufficient documentation

## 2020-09-23 DIAGNOSIS — U071 COVID-19: Secondary | ICD-10-CM | POA: Diagnosis not present

## 2020-09-23 NOTE — Patient Instructions (Signed)
COVID-19 Quarantine vs. Isolation QUARANTINE keeps someone who was in close contact with someone who has COVID-19 away from others. Quarantine if you have been in close contact with someone who has COVID-19, unless you have been fully vaccinated. If you are fully vaccinated  You do NOT need to quarantine unless they have symptoms  Get tested 3-5 days after your exposure, even if you don't have symptoms  Wear a mask indoors in public for 14 days following exposure or until your test result is negative If you are not fully vaccinated  Stay home for 14 days after your last contact with a person who has COVID-19  Watch for fever (100.4F), cough, shortness of breath, or other symptoms of COVID-19  If possible, stay away from people you live with, especially people who are at higher risk for getting very sick from COVID-19  Contact your local public health department for options in your area to possibly shorten your quarantine ISOLATION keeps someone who is sick or tested positive for COVID-19 without symptoms away from others, even in their own home. People who are in isolation should stay home and stay in a specific "sick room" or area and use a separate bathroom (if available). If you are sick and think or know you have COVID-19 Stay home until after  At least 10 days since symptoms first appeared and  At least 24 hours with no fever without the use of fever-reducing medications and  Symptoms have improved If you tested positive for COVID-19 but do not have symptoms  Stay home until after 10 days have passed since your positive viral test  If you develop symptoms after testing positive, follow the steps above for those who are sick cdc.gov/coronavirus 06/10/2020 This information is not intended to replace advice given to you by your health care provider. Make sure you discuss any questions you have with your health care provider. Document Revised: 07/15/2020 Document Reviewed:  07/15/2020 Elsevier Patient Education  2021 Elsevier Inc.  

## 2020-09-23 NOTE — Progress Notes (Signed)
BP 112/69   Pulse 62   Temp 98.4 F (36.9 C) (Oral)   Ht 5' 9.69" (1.77 m)   Wt 176 lb 3.2 oz (79.9 kg)   SpO2 97%   BMI 25.51 kg/m    Subjective:    Patient ID: [name], male    DOB: Nov 22, 1950, 70 y.o.   MRN: [id number]  HPI: [name] is a 70 y.o. male  Chief Complaint  Patient presents with  . Covid F/U     Patient states that he is feeling much better   COVID POSITIVE: Tested positive on 09/11/20, symptoms started on 09/09/20.  Had a cough for 2 days and sore throat for 1 day.  Took cough medication, Tessalon, which improved symptoms.  Reports overall improvement in symptoms, with only minor cough at this time. Fever: no Cough: yes Shortness of breath: no Wheezing: no Chest pain: no Chest tightness: no Chest congestion: no Nasal congestion: no Runny nose: no Post nasal drip: no Sneezing: no Sore throat: no Swollen glands: no Sinus pressure: no Headache: no Face pain: no Toothache: no Ear pain: none Ear pressure: none Eyes red/itching:no Eye drainage/crusting: no  Vomiting: no Rash: no Fatigue: no Sick contacts: no Relief with OTC cold/cough medications: yes  Treatments attempted: cold/sinus and cough syrup   Relevant past medical, surgical, family and social history reviewed and updated as indicated. Interim medical history since our last visit reviewed. Allergies and medications reviewed and updated.  Review of Systems  Constitutional: Negative for activity change, diaphoresis, fatigue and fever.  Respiratory: Positive for cough (improving). Negative for chest tightness, shortness of breath and wheezing.   Cardiovascular: Negative for chest pain, palpitations and leg swelling.  Gastrointestinal: Negative.   Endocrine: Negative for polydipsia, polyphagia and polyuria.  Neurological: Negative.   Psychiatric/Behavioral: Negative.     Per HPI unless specifically indicated above     Objective:    BP 112/69   Pulse 62   Temp  98.4 F (36.9 C) (Oral)   Ht 5' 9.69" (1.77 m)   Wt 176 lb 3.2 oz (79.9 kg)   SpO2 97%   BMI 25.51 kg/m   Wt Readings from Last 3 Encounters:  09/23/20 176 lb 3.2 oz (79.9 kg)  09/03/20 178 lb (80.7 kg)  06/04/20 178 lb (80.7 kg)    Physical Exam Vitals and nursing note reviewed.  Constitutional:      General: He is awake. He is not in acute distress.    Appearance: He is well-developed and well-groomed. He is not ill-appearing.  HENT:     Head: Normocephalic and atraumatic.     Right Ear: Hearing normal. No drainage.     Left Ear: Hearing normal. No drainage.  Eyes:     General: Lids are normal.        Right eye: No discharge.        Left eye: No discharge.     Conjunctiva/sclera: Conjunctivae normal.     Pupils: Pupils are equal, round, and reactive to light.  Neck:     Thyroid: No thyromegaly.     Vascular: No carotid bruit.  Cardiovascular:     Rate and Rhythm: Normal rate and regular rhythm.     Heart sounds: Normal heart sounds, S1 normal and S2 normal. No murmur heard. No gallop.   Pulmonary:     Effort: Pulmonary effort is normal. No accessory muscle usage or respiratory distress.     Breath sounds: Normal breath sounds.  Abdominal:  General: Bowel sounds are normal.     Palpations: Abdomen is soft.  Musculoskeletal:        General: Normal range of motion.     Cervical back: Normal range of motion and neck supple.     Right lower leg: No edema.     Left lower leg: No edema.  Lymphadenopathy:     Cervical: No cervical adenopathy.  Skin:    General: Skin is warm and dry.     Capillary Refill: Capillary refill takes less than 2 seconds.  Neurological:     Mental Status: He is alert and oriented to person, place, and time.     Deep Tendon Reflexes: Reflexes are normal and symmetric.     Reflex Scores:      Brachioradialis reflexes are 2+ on the right side and 2+ on the left side.      Patellar reflexes are 2+ on the right side and 2+ on the left  side. Psychiatric:        Attention and Perception: Attention normal.        Mood and Affect: Mood normal.        Speech: Speech normal.        Behavior: Behavior normal. Behavior is cooperative.        Thought Content: Thought content normal.     Results for orders placed or performed in visit on 09/03/20  Hepatitis C antibody  Result Value Ref Range   Hep C Virus Ab <0.1 0.0 - 0.9 s/co ratio  Lipid Panel w/o Chol/HDL Ratio  Result Value Ref Range   Cholesterol, Total 144 100 - 199 mg/dL   Triglycerides 820 (HH) 0 - 149 mg/dL   HDL 29 (L) >39 mg/dL   VLDL Cholesterol Cal Comment (A) 5 - 40 mg/dL   LDL Chol Calc (NIH) Comment (A) 0 - 99 mg/dL  Basic metabolic panel  Result Value Ref Range   Glucose 170 (H) 65 - 99 mg/dL   BUN 17 8 - 27 mg/dL   Creatinine, Ser 1.15 0.76 - 1.27 mg/dL   GFR calc non Af Amer 64 >59 mL/min/1.73   GFR calc Af Amer 74 >59 mL/min/1.73   BUN/Creatinine Ratio 15 10 - 24   Sodium 139 134 - 144 mmol/L   Potassium 4.5 3.5 - 5.2 mmol/L   Chloride 103 96 - 106 mmol/L   CO2 19 (L) 20 - 29 mmol/L   Calcium 9.3 8.6 - 10.2 mg/dL      Assessment & Plan:   Problem List Items Addressed This Visit      Other   Lab test positive for detection of COVID-19 virus    Diagnosed 09/11/20.  Improved symptoms at this time.  Lung assessment clear on exam.  Recommend to continue Albuterol inhaler and cough medication as needed only.  If any return of symptoms then return to office immediately for follow-up.           Follow up plan: Return if symptoms worsen or fail to improve.

## 2020-09-23 NOTE — Assessment & Plan Note (Signed)
Diagnosed 09/11/20.  Improved symptoms at this time.  Lung assessment clear on exam.  Recommend to continue Albuterol inhaler and cough medication as needed only.  If any return of symptoms then return to office immediately for follow-up.

## 2020-11-22 ENCOUNTER — Other Ambulatory Visit: Payer: Self-pay | Admitting: Nurse Practitioner

## 2020-11-22 MED ORDER — PANTOPRAZOLE SODIUM 40 MG PO TBEC: 40.0000 mg | DELAYED_RELEASE_TABLET | Freq: Every day | ORAL | 4 refills | Status: DC

## 2020-11-22 NOTE — Telephone Encounter (Signed)
Patient last seen 09/03/20 and has appointment 12/03/20.

## 2020-11-22 NOTE — Telephone Encounter (Signed)
Medication Refill - Medication: Pantoprazole 40 mg  90 days   Has the patient contacted their pharmacy? No. (Agent: If no, request that the patient contact the pharmacy for the refill.) (Agent: If yes, when and what did the pharmacy advise?)  Preferred Pharmacy (with phone number or street name): [facility]  Agent: Please be advised that RX refills may take up to 3 business days. We ask that you follow-up with your pharmacy.

## 2020-11-30 ENCOUNTER — Encounter: Payer: Self-pay | Admitting: Nurse Practitioner

## 2020-12-03 ENCOUNTER — Ambulatory Visit: Payer: Medicare Other | Admitting: Nurse Practitioner

## 2020-12-03 NOTE — Progress Notes (Deleted)
Established Patient Office Visit  Subjective:  Patient ID: [name], male    DOB: Feb 22, 1951  Age: 70 y.o. MRN: [id number]  CC: No chief complaint on file.   HPI [name] presents for ***  Past Medical History:  Diagnosis Date  . Acid reflux 04/12/2012  . Arteriosclerosis of coronary artery 04/12/2012   Overview:  1.  Atherosclerotic coronary artery disease.    a.  normal LV function.  Coronary arteriography 5/98 revealed 50% LAD lesion, 95% lesion in large diagonal branch. Left circumflex was normal.  25% proximal RCA lesion.  50% lesion in the PDA.    b.  PTCA of the first diagonal and placement of three multi-link stents.    c.  recent ETT Cardiolite revealed low probability for ischemia.     d.  cardiac catheterization on 05/18/97 per [name] revealed 80% LAD, 70% proximal, 90% mid first AL, 30% RCA.     e.  status post coronary bypass grafting x 2 per [name].    f.  recurrent chest pain with exertion, equivocal ETT.    g.  repeat cardiac catheterization revealed an occluded vein graft.     h.  medical therapy.    i.  ETT Myoview showed borderline changes.     j.  cardiac catheterization 8/02 revealed no significant change in anatomy from previous cath.     k. cardiac cath 06/19/05 revealed patent left internal mammary artery to occluded LAD. Saphenous vein to D1 was chronically occ  . Benign essential HTN 11/19/2015  . Benign fibroma of prostate 03/05/2014  . Cannot sleep 03/05/2014  . Colon polyp 04/12/2012  . COVID 08/2020  . HLD (hyperlipidemia) 11/19/2015  . Presence of artificial eye 04/12/2012  . Spinal stenosis 04/12/2012  . Type 2 diabetes mellitus ([place]) 03/05/2014    Past Surgical History:  Procedure Laterality Date  . COLONOSCOPY WITH PROPOFOL N/A 12/13/2017   Procedure: COLONOSCOPY WITH PROPOFOL;  Surgeon: Brandon Sails, MD;  Location: [facility] ENDOSCOPY;  Service: Endoscopy;  Laterality: N/A;  . CORONARY ARTERY BYPASS GRAFT  1998  . CORONARY STENT  PLACEMENT  2006  . JOINT REPLACEMENT Left    hip- 06/2019  . 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 LEFT HEART CATH AND CORONARY ANGIOGRAPHY Left 06/28/2018   Procedure: LEFT HEART CATH AND CORONARY ANGIOGRAPHY;  Surgeon: Brandon Spray, MD;  Location: [facility];  Service: Cardiovascular;  Laterality: Left;  . lt knee cap     had to have lt knee cap replaced    Family History  Problem Relation Age of Onset  . Hypertension Brother   . Diabetes Brother   . Heart disease Mother   . Heart disease Father   . Diabetes Daughter   . Schizophrenia Maternal Grandmother   . Heart disease Brother   . Prostate cancer Neg Hx   . Bladder Cancer Neg Hx   . Kidney cancer Neg Hx     Social History   Socioeconomic History  . Marital status: Married    Spouse name: Not on file  . Number of children: Not on file  . Years of education: Not on file  . Highest education level: Not on file  Occupational History  . Not on file  Tobacco Use  . Smoking status: Former Smoker    Quit date: 1998    Years since quitting: 24.2  . Smokeless tobacco: Never Used  Vaping Use  . Vaping Use: Never used  Substance and Sexual Activity  . Alcohol use: Yes    Alcohol/week: 6.0 standard drinks    Types: 6 Cans of beer per week  . Drug use: No  . Sexual activity: Yes  Other Topics Concern  . Not on file  Social History Narrative  . Not on file   Social Determinants of Health   Financial Resource Strain: Low Risk   . Difficulty of Paying Living Expenses: Not hard at all  Food Insecurity: No Food Insecurity  . Worried About Charity fundraiser in the Last Year: Never true  . Ran Out of Food in the Last Year: Never true  Transportation Needs: No Transportation Needs  . Lack of Transportation (Medical): No  . Lack of Transportation (Non-Medical): No  Physical Activity: Insufficiently Active  . Days of Exercise per Week: 2 days  . Minutes of Exercise per Session: 30  min  Stress: No Stress Concern Present  . Feeling of Stress : Not at all  Social Connections: Socially Integrated  . Frequency of Communication with Friends and Family: Three times a week  . Frequency of Social Gatherings with Friends and Family: Three times a week  . Attends Religious Services: More than 4 times per year  . Active Member of Clubs or Organizations: Yes  . Attends Archivist Meetings: Never  . Marital Status: Married  Human resources officer Violence: Not on file    Outpatient Medications Prior to Visit  Medication Sig Dispense Refill  . albuterol (VENTOLIN HFA) 108 (90 Base) MCG/ACT inhaler Inhale 2 puffs into the lungs every 6 (six) hours as needed for wheezing or shortness of breath. 18 g 0  . amitriptyline (ELAVIL) 10 MG tablet Take 2 tablets (20 mg total) by mouth at bedtime. 180 tablet 3  . aspirin EC 81 MG tablet Take 81 mg by mouth daily.     Marland Kitchen atenolol (TENORMIN) 50 MG tablet Take 50 mg by mouth daily.    . benzonatate (TESSALON PERLES) 100 MG capsule Take 1 capsule (100 mg total) by mouth 3 (three) times daily as needed for cough. 42 capsule 0  . clopidogrel (PLAVIX) 75 MG tablet Take 75 mg by mouth daily.     . fenofibrate (TRICOR) 145 MG tablet Take 1 tablet (145 mg total) by mouth daily. 90 tablet 4  . glimepiride (AMARYL) 4 MG tablet Take by mouth.    . insulin degludec (TRESIBA FLEXTOUCH) 100 UNIT/ML FlexTouch Pen Inject 70 Units into the skin daily.     Marland Kitchen JARDIANCE 25 MG TABS tablet Take 25 mg by mouth daily.    . metFORMIN (GLUCOPHAGE) 500 MG tablet Take 2 tablets (1,000 mg total) by mouth 2 (two) times daily with a meal. 360 tablet 4  . nitroGLYCERIN (NITROSTAT) 0.4 MG SL tablet Place 0.4 mg under the tongue every 5 (five) minutes as needed.    Marland Kitchen omega-3 acid ethyl esters (LOVAZA) 1 g capsule Take 2 capsules (2 g total) by mouth 2 (two) times daily. 360 capsule 1  . pantoprazole (PROTONIX) 40 MG tablet Take 1 tablet (40 mg total) by mouth daily. 90  tablet 4  . ramipril (ALTACE) 1.25 MG capsule Take 1.25 mg by mouth daily.    . rosuvastatin (CRESTOR) 40 MG tablet Take 1 tablet (40 mg total) by mouth daily. 90 tablet 3  . vitamin B-12 (CYANOCOBALAMIN) 1000 MCG tablet Take 1,000 mcg by mouth daily.     No  facility-administered medications prior to visit.    Allergies  Allergen Reactions  . Trulicity [Dulaglutide] Nausea Only  . Penicillins Rash    Childhood. Did not require medical care. Childhood. Did not require medical care. Childhood. Did not require medical care.    ROS Review of Systems    Objective:    Physical Exam  There were no vitals taken for this visit. Wt Readings from Last 3 Encounters:  09/23/20 176 lb 3.2 oz (79.9 kg)  09/03/20 178 lb (80.7 kg)  06/04/20 178 lb (80.7 kg)     Health Maintenance Due  Topic Date Due  . OPHTHALMOLOGY EXAM  Never done  . COVID-19 Vaccine (3 - Booster for Pfizer series) 05/28/2020    There are no preventive care reminders to display for this patient.  Lab Results  Component Value Date   TSH 2.150 02/28/2020   Lab Results  Component Value Date   WBC 4.4 06/04/2020   HGB 14.2 06/04/2020   HCT 42.2 06/04/2020   MCV 93 06/04/2020   PLT 172 06/04/2020   Lab Results  Component Value Date   NA 139 09/03/2020   K 4.5 09/03/2020   CO2 19 (L) 09/03/2020   GLUCOSE 170 (H) 09/03/2020   BUN 17 09/03/2020   CREATININE 1.15 09/03/2020   CALCIUM 9.3 09/03/2020   ANIONGAP 12 02/10/2020   Lab Results  Component Value Date   CHOL 144 09/03/2020   Lab Results  Component Value Date   HDL 29 (L) 09/03/2020   Lab Results  Component Value Date   LDLCALC Comment (A) 09/03/2020   Lab Results  Component Value Date   TRIG 820 ([place]) 09/03/2020   No results found for: [facility] Lab Results  Component Value Date   HGBA1C 7.2 11/06/2019      Assessment & Plan:   Problem List Items Addressed This Visit   None     No orders of the defined types were placed in  this encounter.   Follow-up: No follow-ups on file.    Charyl Dancer, NP

## 2020-12-10 ENCOUNTER — Ambulatory Visit (INDEPENDENT_AMBULATORY_CARE_PROVIDER_SITE_OTHER): Payer: Medicare Other | Admitting: Nurse Practitioner

## 2020-12-10 ENCOUNTER — Other Ambulatory Visit: Payer: Self-pay

## 2020-12-10 ENCOUNTER — Encounter: Payer: Self-pay | Admitting: Nurse Practitioner

## 2020-12-10 VITALS — BP 131/76 | HR 61 | Temp 97.9°F | Wt 177.0 lb

## 2020-12-10 DIAGNOSIS — N183 Chronic kidney disease, stage 3 unspecified: Secondary | ICD-10-CM

## 2020-12-10 DIAGNOSIS — E1159 Type 2 diabetes mellitus with other circulatory complications: Secondary | ICD-10-CM

## 2020-12-10 DIAGNOSIS — Z794 Long term (current) use of insulin: Secondary | ICD-10-CM

## 2020-12-10 DIAGNOSIS — I152 Hypertension secondary to endocrine disorders: Secondary | ICD-10-CM

## 2020-12-10 DIAGNOSIS — K219 Gastro-esophageal reflux disease without esophagitis: Secondary | ICD-10-CM

## 2020-12-10 DIAGNOSIS — E538 Deficiency of other specified B group vitamins: Secondary | ICD-10-CM

## 2020-12-10 DIAGNOSIS — F5101 Primary insomnia: Secondary | ICD-10-CM

## 2020-12-10 DIAGNOSIS — E785 Hyperlipidemia, unspecified: Secondary | ICD-10-CM

## 2020-12-10 DIAGNOSIS — E1165 Type 2 diabetes mellitus with hyperglycemia: Secondary | ICD-10-CM

## 2020-12-10 DIAGNOSIS — E1122 Type 2 diabetes mellitus with diabetic chronic kidney disease: Secondary | ICD-10-CM | POA: Diagnosis not present

## 2020-12-10 DIAGNOSIS — E1169 Type 2 diabetes mellitus with other specified complication: Secondary | ICD-10-CM

## 2020-12-10 DIAGNOSIS — K589 Irritable bowel syndrome without diarrhea: Secondary | ICD-10-CM | POA: Insufficient documentation

## 2020-12-10 DIAGNOSIS — K580 Irritable bowel syndrome with diarrhea: Secondary | ICD-10-CM

## 2020-12-10 LAB — MICROALBUMIN, URINE WAIVED
Creatinine, Urine Waived: 200 mg/dL (ref 10–300)
Microalb, Ur Waived: 30 mg/L — ABNORMAL HIGH (ref 0–19)
Microalb/Creat Ratio: <30 mg/g (ref <30)

## 2020-12-10 NOTE — Assessment & Plan Note (Signed)
Chronic, ongoing with BP at goal.  Goal <130/80.  Will continue current medication regimen and cardiology collaboration.  Recommend he monitor BP at home at least a few mornings a week and document for provider + focus on DASH diet.  Could consider addition of Amlodipine if elevation in SBP noted.  CMP today.  Recommend he return to cardiology for yearly visit.  Return in 3 months.

## 2020-12-10 NOTE — Assessment & Plan Note (Signed)
Chronic, ongoing.  Continue Crestor, tolerating at this time.  Adjust dose as needed.  Lipid panel today.

## 2020-12-10 NOTE — Assessment & Plan Note (Signed)
Ongoing, stable.  Recheck CMP today and monitor.  Continue Ramipril for kidney protection.  Consider nephrology referral if decline ever noted.  Most recent A1c 8.6% with endocrinology, continue this collaboration.

## 2020-12-10 NOTE — Assessment & Plan Note (Signed)
Chronic, stable on recent recheck.  Continue daily supplement and recheck level next visit.

## 2020-12-10 NOTE — Assessment & Plan Note (Signed)
Chronic, ongoing, stable with daily PPI.  Would benefit from trial reduction in future, discussed with patient.  At this time maintain and monitor.  Mag level next visit.

## 2020-12-10 NOTE — Assessment & Plan Note (Signed)
Chronic, ongoing.  Would benefit from ongoing discontinuation of Ambien due to age >24.  Is tolerating Amitriptyline.  Will continue this regimen at 20 MG.  In future if ongoing issues, could consider Belsomra.  Continue to monitor and adjust regimen as needed.  Recommend sleep hygiene techniques.

## 2020-12-10 NOTE — Patient Instructions (Signed)

## 2020-12-10 NOTE — Assessment & Plan Note (Signed)
Chronic, ongoing.  Followed by endocrinology with recent A1C 8.6%.  Continue current medication regimen as prescribed by endocrinology, notes reviewed.  CCM collaboration continues.  Urine ALB check 30 today, continue Ramipril for protection.  Recommend he check BS at least twice a day, fasting and 2 hours after a meal.  Return in 3 months for follow-up.

## 2020-12-10 NOTE — Progress Notes (Signed)
BP 131/76   Pulse 61   Temp 97.9 F (36.6 C) (Oral)   Wt 177 lb (80.3 kg)   SpO2 98%   BMI 25.63 kg/m    Subjective:    Patient ID: [name], male    DOB: 06/16/1951, 70 y.o.   MRN: [id number]  HPI: [name] is a 70 y.o. male  Chief Complaint  Patient presents with  . Diabetes    Patient denies having any problems or concerns at today's visit.  Marland Kitchen Hyperlipidemia  . Hypertension  . Insomnia  . Gastroesophageal Reflux   DIABETES Followed by endocrinology and last saw Dr. Honor Junes on 10/25/20 with A1C 8.6% with no changes. Was diagnosed > 7 years ago.  Taking Metformin 1000 MG BID, Tresiba 70 units, and Jardiance 25 MG daily.  Has B12 deficiency, long term Metformin use, with initial result 174 and in September improved with supplement at 523.  Hypoglycemic episodes:no Polydipsia/polyuria: no Visual disturbance: no Chest pain: no Paresthesias: no Glucose Monitoring: yes             Accucheck frequency: Daily             Fasting glucose: averages 130-140 == 131 this morning             Post prandial:             Evening:             Before meals: Taking Insulin?: yes             Long acting insulin: Tresiba 70 units             Short acting insulin: Blood Pressure Monitoring: not checking Retinal Examination: Not up to Date -- goes to [facility], on the 8th of April Foot Exam: Up to Date Pneumovax: Up to Date Influenza: Up to Date Aspirin: yes   HYPERTENSION / HYPERLIPIDEMIA Followed by cardiology and history of CABG in 1998, stent in 2006, catheterization in 2011.  Last saw Dr. Ubaldo Glassing with cardiology on 10/10/2018, per this note to continue dual anti-platelet therapy. Continues on Ramipril 1.25 MG, Atenolol 50 MG, ASA, and Plavix. Last echo in 2019 and 55% .  Also has NTG for angina pain, has not used in a long time. Taking Crestor for HLD. Satisfied with current treatment? yes Duration of hypertension: chronic BP monitoring frequency: not  checking BP range:  BP medication side effects: no Duration of hyperlipidemia: chronic Cholesterol medication side effects: no Cholesterol supplements: none Medication compliance: good compliance Aspirin: yes Recent stressors: no Recurrent headaches: no Visual changes: no Palpitations: no Dyspnea: no Chest pain: no Lower extremity edema: no Dizzy/lightheaded: no   INSOMNIA Taking Amitriptyline 20 MG at bedtime, has taken Ambien in past. Duration: chronic Satisfied with sleep quality: no Difficulty falling asleep: no Difficulty staying asleep: yes Waking a few hours after sleep onset: yes Early morning awakenings: no Daytime hypersomnolence: no Wakes feeling refreshed: yes Good sleep hygiene: yes Apnea: no Snoring: no Depressed/anxious mood: no Recent stress: no Restless legs/nocturnal leg cramps: no Chronic pain/arthritis: no History of sleep study: no Treatments attempted: ambien   GERD Takes Protonix daily.  Reports issues with diarrhea since his teen years, irritable bowel.  Takes Imodium occasionally.  Reports 99% of stools are diarrhea in consistency, mother was the same.  Denies any pain with this, most often presents after eating.  No gall bladder, removed 15 years ago.  GERD control status: stable  Satisfied with current  treatment? yes Heartburn frequency: none Medication side effects: no  Medication compliance: stable Previous GERD medications: Antacid use frequency:  none Dysphagia: no Odynophagia:  no Hematemesis: no Blood in stool: no EGD: no  Relevant past medical, surgical, family and social history reviewed and updated as indicated. Interim medical history since our last visit reviewed. Allergies and medications reviewed and updated.  Review of Systems  Constitutional: Negative for activity change, diaphoresis, fatigue and fever.  Respiratory: Negative for cough, chest tightness, shortness of breath and wheezing.   Cardiovascular: Negative for  chest pain, palpitations and leg swelling.  Gastrointestinal: Negative.   Endocrine: Negative for polydipsia, polyphagia and polyuria.  Neurological: Negative.   Psychiatric/Behavioral: Negative.     Per HPI unless specifically indicated above     Objective:    BP 131/76   Pulse 61   Temp 97.9 F (36.6 C) (Oral)   Wt 177 lb (80.3 kg)   SpO2 98%   BMI 25.63 kg/m   Wt Readings from Last 3 Encounters:  12/10/20 177 lb (80.3 kg)  09/23/20 176 lb 3.2 oz (79.9 kg)  09/03/20 178 lb (80.7 kg)    Physical Exam Vitals and nursing note reviewed.  Constitutional:      General: He is awake. He is not in acute distress.    Appearance: He is well-developed and well-groomed. He is not ill-appearing.  HENT:     Head: Normocephalic and atraumatic.     Right Ear: Hearing normal. No drainage.     Left Ear: Hearing normal. No drainage.  Eyes:     General: Lids are normal.        Right eye: No discharge.        Left eye: No discharge.     Conjunctiva/sclera: Conjunctivae normal.     Pupils: Pupils are equal, round, and reactive to light.  Neck:     Thyroid: No thyromegaly.     Vascular: No carotid bruit.  Cardiovascular:     Rate and Rhythm: Normal rate and regular rhythm.     Heart sounds: Normal heart sounds, S1 normal and S2 normal. No murmur heard. No gallop.   Pulmonary:     Effort: Pulmonary effort is normal. No accessory muscle usage or respiratory distress.     Breath sounds: Normal breath sounds.  Abdominal:     General: Bowel sounds are normal.     Palpations: Abdomen is soft.  Musculoskeletal:        General: Normal range of motion.     Cervical back: Normal range of motion and neck supple.     Right lower leg: No edema.     Left lower leg: No edema.  Lymphadenopathy:     Cervical: No cervical adenopathy.  Skin:    General: Skin is warm and dry.     Capillary Refill: Capillary refill takes less than 2 seconds.  Neurological:     Mental Status: He is alert and  oriented to person, place, and time.     Deep Tendon Reflexes: Reflexes are normal and symmetric.     Reflex Scores:      Brachioradialis reflexes are 2+ on the right side and 2+ on the left side.      Patellar reflexes are 2+ on the right side and 2+ on the left side. Psychiatric:        Attention and Perception: Attention normal.        Mood and Affect: Mood normal.  Speech: Speech normal.        Behavior: Behavior normal. Behavior is cooperative.        Thought Content: Thought content normal.    Results for orders placed or performed in visit on 09/03/20  Hepatitis C antibody  Result Value Ref Range   Hep C Virus Ab <0.1 0.0 - 0.9 s/co ratio  Lipid Panel w/o Chol/HDL Ratio  Result Value Ref Range   Cholesterol, Total 144 100 - 199 mg/dL   Triglycerides 820 (HH) 0 - 149 mg/dL   HDL 29 (L) >39 mg/dL   VLDL Cholesterol Cal Comment (A) 5 - 40 mg/dL   LDL Chol Calc (NIH) Comment (A) 0 - 99 mg/dL  Basic metabolic panel  Result Value Ref Range   Glucose 170 (H) 65 - 99 mg/dL   BUN 17 8 - 27 mg/dL   Creatinine, Ser 1.15 0.76 - 1.27 mg/dL   GFR calc non Af Amer 64 >59 mL/min/1.73   GFR calc Af Amer 74 >59 mL/min/1.73   BUN/Creatinine Ratio 15 10 - 24   Sodium 139 134 - 144 mmol/L   Potassium 4.5 3.5 - 5.2 mmol/L   Chloride 103 96 - 106 mmol/L   CO2 19 (L) 20 - 29 mmol/L   Calcium 9.3 8.6 - 10.2 mg/dL      Assessment & Plan:   Problem List Items Addressed This Visit      Cardiovascular and Mediastinum   Hypertension associated with type 2 diabetes mellitus (HCC)    Chronic, ongoing with BP at goal.  Goal <130/80.  Will continue current medication regimen and cardiology collaboration.  Recommend he monitor BP at home at least a few mornings a week and document for provider + focus on DASH diet.  Could consider addition of Amlodipine if elevation in SBP noted.  CMP today.  Recommend he return to cardiology for yearly visit.  Return in 3 months.      Relevant Orders    Comprehensive metabolic panel   Microalbumin, Urine Waived     Digestive   GERD (gastroesophageal reflux disease)    Chronic, ongoing, stable with daily PPI.  Would benefit from trial reduction in future, discussed with patient.  At this time maintain and monitor.  Mag level next visit.      Irritable bowel syndrome    Chronic, since teen years.  Continue Imodium as needed.  Recommend trial daily Miralax to help bulk stool, if more diarrhea with this then stop.  Could consider trial of Welchol or Questran at future visits.          Endocrine   Hyperlipidemia associated with type 2 diabetes mellitus (HCC)    Chronic, ongoing.  Continue Crestor, tolerating at this time.  Adjust dose as needed.  Lipid panel today.      Relevant Orders   Comprehensive metabolic panel   Lipid Panel w/o Chol/HDL Ratio   Type 2 diabetes mellitus with hyperglycemia, with long-term current use of insulin (HCC) - Primary    Chronic, ongoing.  Followed by endocrinology with recent A1C 8.6%.  Continue current medication regimen as prescribed by endocrinology, notes reviewed.  CCM collaboration continues.  Urine ALB check 30 today, continue Ramipril for protection.  Recommend he check BS at least twice a day, fasting and 2 hours after a meal.  Return in 3 months for follow-up.      CKD stage 3 due to type 2 diabetes mellitus (HCC)    Ongoing, stable.  Recheck CMP  today and monitor.  Continue Ramipril for kidney protection.  Consider nephrology referral if decline ever noted.  Most recent A1c 8.6% with endocrinology, continue this collaboration.        Other   Insomnia    Chronic, ongoing.  Would benefit from ongoing discontinuation of Ambien due to age >76.  Is tolerating Amitriptyline.  Will continue this regimen at 20 MG.  In future if ongoing issues, could consider Belsomra.  Continue to monitor and adjust regimen as needed.  Recommend sleep hygiene techniques.      Vitamin B12 deficiency    Chronic, stable  on recent recheck.  Continue daily supplement and recheck level next visit.          Follow up plan: Return in about 3 months (around 03/12/2021) for T2DM, HTN/HLD, GERD.

## 2020-12-10 NOTE — Assessment & Plan Note (Signed)
Chronic, since teen years.  Continue Imodium as needed.  Recommend trial daily Miralax to help bulk stool, if more diarrhea with this then stop.  Could consider trial of Welchol or Questran at future visits.

## 2020-12-11 ENCOUNTER — Other Ambulatory Visit: Payer: Self-pay | Admitting: Nurse Practitioner

## 2020-12-11 DIAGNOSIS — E781 Pure hyperglyceridemia: Secondary | ICD-10-CM | POA: Insufficient documentation

## 2020-12-11 DIAGNOSIS — E785 Hyperlipidemia, unspecified: Secondary | ICD-10-CM

## 2020-12-11 DIAGNOSIS — E1169 Type 2 diabetes mellitus with other specified complication: Secondary | ICD-10-CM

## 2020-12-11 LAB — COMPREHENSIVE METABOLIC PANEL
ALT: 15 IU/L (ref 0–44)
AST: 18 IU/L (ref 0–40)
Albumin/Globulin Ratio: 2.3 — ABNORMAL HIGH (ref 1.2–2.2)
Albumin: 4.6 g/dL (ref 3.7–4.7)
Alkaline Phosphatase: 66 IU/L (ref 44–121)
BUN/Creatinine Ratio: 13 (ref 10–24)
BUN: 18 mg/dL (ref 8–27)
Bilirubin Total: 0.3 mg/dL (ref 0.0–1.2)
CO2: 20 mmol/L (ref 20–29)
Calcium: 9.3 mg/dL (ref 8.6–10.2)
Chloride: 102 mmol/L (ref 96–106)
Creatinine, Ser: 1.43 mg/dL — ABNORMAL HIGH (ref 0.76–1.27)
Globulin, Total: 2 g/dL (ref 1.5–4.5)
Glucose: 115 mg/dL — ABNORMAL HIGH (ref 65–99)
Potassium: 4.5 mmol/L (ref 3.5–5.2)
Sodium: 140 mmol/L (ref 134–144)
Total Protein: 6.6 g/dL (ref 6.0–8.5)
eGFR: 52 mL/min/{1.73_m2} — ABNORMAL LOW (ref >59)

## 2020-12-11 LAB — LIPID PANEL W/O CHOL/HDL RATIO
Cholesterol, Total: 198 mg/dL (ref 100–199)
HDL: 32 mg/dL — ABNORMAL LOW (ref >39)
LDL Chol Calc (NIH): 67 mg/dL (ref 0–99)
Triglycerides: 646 mg/dL (ref 0–149)
VLDL Cholesterol Cal: 99 mg/dL — ABNORMAL HIGH (ref 5–40)

## 2020-12-11 NOTE — Progress Notes (Signed)
Please reach out to patient, as unsure he checks MyChart: [name] your labs have returned.  You are starting to show a little kidney disease on this check with some elevation in creatinine and lowering of eGFR.  Continue daily Ramipril for kidney protection and we will recheck next visit + ensure good water intake.  Your LDL, bad cholesterol, has improved this check.  But triglycerides remain quite high at 646.  Are you taking Fenofibrate daily and your fish oil and Rosuvastatin?  If so I would like to check this with you fasting one early morning please, I would like you to schedule this over next week.  If ongoing elevations, I may get you in with a cholesterol specialist in [facility] for more recommendations as concern is for pancreatitis or heart issues with elevations.  Any questions? Keep being awesome!!  Thank you for allowing me to participate in your care. Kindest regards, Cathryne Mancebo

## 2020-12-20 ENCOUNTER — Other Ambulatory Visit: Payer: Self-pay | Admitting: Nurse Practitioner

## 2020-12-20 DIAGNOSIS — H5203 Hypermetropia, bilateral: Secondary | ICD-10-CM | POA: Diagnosis not present

## 2020-12-20 DIAGNOSIS — H524 Presbyopia: Secondary | ICD-10-CM | POA: Diagnosis not present

## 2020-12-20 DIAGNOSIS — H52209 Unspecified astigmatism, unspecified eye: Secondary | ICD-10-CM | POA: Diagnosis not present

## 2020-12-20 NOTE — Telephone Encounter (Signed)
Requested Prescriptions  Pending Prescriptions Disp Refills  . rosuvastatin (CRESTOR) 40 MG tablet [Pharmacy Med Name: ROSUVASTATIN 40MG  TABLETS] 90 tablet 3    Sig: TAKE 1 TABLET(40 MG) BY MOUTH DAILY     Cardiovascular:  Antilipid - Statins Failed - 12/20/2020  7:13 AM      Failed - LDL in normal range and within 360 days    LDL Chol Calc (NIH)  Date Value Ref Range Status  12/10/2020 67 0 - 99 mg/dL Final         Failed - HDL in normal range and within 360 days    HDL  Date Value Ref Range Status  12/10/2020 32 (L) >39 mg/dL Final         Failed - Triglycerides in normal range and within 360 days    Triglycerides  Date Value Ref Range Status  12/10/2020 646 (HH) 0 - 149 mg/dL Final         Passed - Total Cholesterol in normal range and within 360 days    Cholesterol, Total  Date Value Ref Range Status  12/10/2020 198 100 - 199 mg/dL Final         Passed - Patient is not pregnant      Passed - Valid encounter within last 12 months    Recent Outpatient Visits          1 week ago Type 2 diabetes mellitus with hyperglycemia, with long-term current use of insulin ([place])   [place], [place] T, NP   2 months ago Lab test positive for detection of COVID-19 virus   [employer], [place] T, NP   3 months ago Type 2 diabetes mellitus with hyperglycemia, with long-term current use of insulin ([place])   [place], [place] T, NP   6 months ago Flu vaccine need   [facility] Noemi [place] A, NP   9 months ago Type 2 diabetes mellitus with hyperglycemia, with long-term current use of insulin ([place])   [place], Barbaraann Faster, NP      Future Appointments            In 2 months [place], Barbaraann Faster, NP [name]   In 4 months  [name]           . amitriptyline (ELAVIL) 10 MG tablet [Pharmacy Med Name: AMITRIPTYLINE 10MG  TABLETS] 180 tablet 3     Sig: TAKE 2 TABLETS(20 MG) BY MOUTH AT BEDTIME     Psychiatry:  Antidepressants - Heterocyclics (TCAs) Passed - 12/20/2020  7:13 AM      Passed - Valid encounter within last 6 months    Recent Outpatient Visits          1 week ago Type 2 diabetes mellitus with hyperglycemia, with long-term current use of insulin ([place])   [place], [place] T, NP   2 months ago Lab test positive for detection of COVID-19 virus   [employer], [place] T, NP   3 months ago Type 2 diabetes mellitus with hyperglycemia, with long-term current use of insulin ([place])   [place], [place] T, NP   6 months ago Flu vaccine need   [facility] Noemi [place] A, NP   9 months ago Type 2 diabetes mellitus with hyperglycemia, with long-term current use of insulin ([place])   [place], Barbaraann Faster, NP      Future  Appointments            In 2 months Cannady, Barbaraann Faster, NP [name], [place]   In 4 months  [name]

## 2021-01-23 ENCOUNTER — Telehealth: Payer: Self-pay | Admitting: Pharmacist

## 2021-01-23 NOTE — Chronic Care Management (AMB) (Signed)
Chronic Care Management Pharmacy Assistant   Name: [name]  MRN: [id number] DOB: Aug 19, 1951   Reason for Encounter: General Adherence Disease State call    Recent office visits:  12/10/20- [name] NP (PCP)- Seen for diabetes. Labs performed. PCP recommended monitoring b/p a few mornings a week and BS at least twice a day, fasting and 2 hours after a meal.  PCP recommended starting Miralax daily. Follow up in 3 months 09/23/20- [name] NP (PCP)- lab test positive for COVID. Recommended continued use of Albuterol and cough medicine. Follow up as needed.  09/11/20-Jolene Cannady NP (PCP)- telephone note.  Started Tessalon pearles 100mg  take 1 three times daily prn cough and Albuterol inhaler 108MCG/ACT 2 puffs every 6 hr prn wheezing or SOB for cough.  09/03/20-jolene Cannady NP (PCP)- Seen for diabetes. Labs performed. PCP recommended monitoring b/p a few mornings a week and BS at least twice a day, fasting and 2 hours after a meal. Follow up in 3 months.   Recent consult visits:  12/20/20-[facility] no data available 10/25/20- Malissa Hippo NP(Endocrinology)- Office visit for follow up diabetes.  Recommended to monitor BS twice daily fasting each morning and before bed and keep a log. Ordered Glucometer and freestyle [place] system. Continue current medications. Follow up in 3 months  Hospital visits:  None in previous 6 months  Medications: Outpatient Encounter Medications as of 01/23/2021  Medication Sig  . albuterol (VENTOLIN HFA) 108 (90 Base) MCG/ACT inhaler Inhale 2 puffs into the lungs every 6 (six) hours as needed for wheezing or shortness of breath.  Marland Kitchen amitriptyline (ELAVIL) 10 MG tablet TAKE 2 TABLETS(20 MG) BY MOUTH AT BEDTIME  . aspirin EC 81 MG tablet Take 81 mg by mouth daily.   Marland Kitchen atenolol (TENORMIN) 50 MG tablet Take 50 mg by mouth daily.  . benzonatate (TESSALON PERLES) 100 MG capsule Take 1 capsule (100 mg total) by mouth 3 (three)  times daily as needed for cough.  . clopidogrel (PLAVIX) 75 MG tablet Take 75 mg by mouth daily.   . Continuous Blood Gluc Sensor (FREESTYLE LIBRE 14 DAY SENSOR) MISC Apply topically every 14 (fourteen) days.  . fenofibrate (TRICOR) 145 MG tablet Take 1 tablet (145 mg total) by mouth daily.  Marland Kitchen glimepiride (AMARYL) 4 MG tablet Take by mouth.  . insulin degludec (TRESIBA FLEXTOUCH) 100 UNIT/ML FlexTouch Pen Inject 70 Units into the skin daily.   Marland Kitchen JARDIANCE 25 MG TABS tablet Take 25 mg by mouth daily.  . metFORMIN (GLUCOPHAGE) 500 MG tablet Take 2 tablets (1,000 mg total) by mouth 2 (two) times daily with a meal.  . nitroGLYCERIN (NITROSTAT) 0.4 MG SL tablet Place 0.4 mg under the tongue every 5 (five) minutes as needed.  Marland Kitchen omega-3 acid ethyl esters (LOVAZA) 1 g capsule Take 2 capsules (2 g total) by mouth 2 (two) times daily.  . pantoprazole (PROTONIX) 40 MG tablet Take 1 tablet (40 mg total) by mouth daily.  . ramipril (ALTACE) 1.25 MG capsule Take 1.25 mg by mouth daily.  . rosuvastatin (CRESTOR) 40 MG tablet TAKE 1 TABLET(40 MG) BY MOUTH DAILY  . vitamin B-12 (CYANOCOBALAMIN) 1000 MCG tablet Take 1,000 mcg by mouth daily.   No facility-administered encounter medications on file as of 01/23/2021.   Have you had any problems recently with your health?  Have you had any problems with your pharmacy?  What issues or side effects are you having with your medications?  What would you like me  to pass along to [facility] for them to help you with?   What can we do to take care of you better?  Star Rating Drugs: Glimepiride 4 mg last fill date unavailable Metformin 500mg  last filled 10/18/20 90 DS Rosuvastatin 40mg  last filled 12/20/20 90DS Ramipril 1.25mg  last filled 12/23/20 30DS Jardiance 25mg   Last filled 11/23/20 30DS  Unable to reach patient. Left voicemail x [place] Pharmacist Assistant [phone]

## 2021-01-24 ENCOUNTER — Other Ambulatory Visit: Payer: Self-pay

## 2021-01-24 MED ORDER — RAMIPRIL 1.25 MG PO CAPS: 1.2500 mg | ORAL_CAPSULE | Freq: Every day | ORAL | 4 refills | Status: DC

## 2021-01-24 MED ORDER — FENOFIBRATE 145 MG PO TABS: 145.0000 mg | ORAL_TABLET | Freq: Every day | ORAL | 4 refills | Status: DC

## 2021-01-24 MED ORDER — METFORMIN HCL 500 MG PO TABS: 1000.0000 mg | ORAL_TABLET | Freq: Two times a day (BID) | ORAL | 4 refills | Status: DC

## 2021-01-24 MED ORDER — ROSUVASTATIN CALCIUM 40 MG PO TABS: ORAL_TABLET | ORAL | 4 refills | Status: DC

## 2021-01-24 MED ORDER — AMITRIPTYLINE HCL 10 MG PO TABS: ORAL_TABLET | ORAL | 4 refills | Status: DC

## 2021-01-24 MED ORDER — PANTOPRAZOLE SODIUM 40 MG PO TBEC: 40.0000 mg | DELAYED_RELEASE_TABLET | Freq: Every day | ORAL | 4 refills | Status: DC

## 2021-01-24 MED ORDER — CLOPIDOGREL BISULFATE 75 MG PO TABS: 75.0000 mg | ORAL_TABLET | Freq: Every day | ORAL | 4 refills | Status: DC

## 2021-01-24 MED ORDER — JARDIANCE 25 MG PO TABS: 25.0000 mg | ORAL_TABLET | Freq: Every day | ORAL | 4 refills | Status: DC

## 2021-01-24 MED ORDER — ATENOLOL 50 MG PO TABS: 50.0000 mg | ORAL_TABLET | Freq: Every day | ORAL | 4 refills | Status: DC

## 2021-02-06 NOTE — Chronic Care Management (AMB) (Signed)
Chronic Care Management Pharmacy Assistant   Name: [name]  MRN: [id number] DOB: Feb 09, 1951  Reason for Encounter: General Adherence Disease State call    Recent office visits:  12/10/20- [name] NP (PCP)- Seen for diabetes. Labs performed. PCP recommended monitoring b/p a few mornings a week and BS at least twice a day, fasting and 2 hours after a meal.  PCP recommended starting Miralax daily. Follow up in 3 months 09/23/20- [name] NP (PCP)- lab test positive for COVID. Recommended continued use of Albuterol and cough medicine. Follow up as needed.  09/11/20-Jolene Cannady NP (PCP)- telephone note.  Started Tessalon pearles 100mg  take 1 three times daily prn cough and Albuterol inhaler 108MCG/ACT 2 puffs every 6 hr prn wheezing or SOB for cough.  09/03/20-jolene Cannady NP (PCP)- Seen for diabetes. Labs performed. PCP recommended monitoring b/p a few mornings a week and BS at least twice a day, fasting and 2 hours after a meal. Follow up in 3 months.   Recent consult visits:  12/20/20-[facility] [place].- no data available 10/25/20- Malissa Hippo NP(Endocrinology)- Office visit for follow up diabetes.  Recommended to monitor BS twice daily fasting each morning and before bed and keep a log. Ordered Glucometer and freestyle [place] system. Continue current medications. Follow up in 3 months  Hospital visits:  None in previous 6 months  Medications: Outpatient Encounter Medications as of 01/23/2021  Medication Sig  . albuterol (VENTOLIN HFA) 108 (90 Base) MCG/ACT inhaler Inhale 2 puffs into the lungs every 6 (six) hours as needed for wheezing or shortness of breath.  Marland Kitchen aspirin EC 81 MG tablet Take 81 mg by mouth daily.   . benzonatate (TESSALON PERLES) 100 MG capsule Take 1 capsule (100 mg total) by mouth 3 (three) times daily as needed for cough.  . Continuous Blood Gluc Sensor (FREESTYLE LIBRE 14 DAY SENSOR) MISC Apply topically every 14 (fourteen) days.   Marland Kitchen glimepiride (AMARYL) 4 MG tablet Take by mouth.  . insulin degludec (TRESIBA FLEXTOUCH) 100 UNIT/ML FlexTouch Pen Inject 70 Units into the skin daily.   . nitroGLYCERIN (NITROSTAT) 0.4 MG SL tablet Place 0.4 mg under the tongue every 5 (five) minutes as needed.  Marland Kitchen omega-3 acid ethyl esters (LOVAZA) 1 g capsule Take 2 capsules (2 g total) by mouth 2 (two) times daily.  . vitamin B-12 (CYANOCOBALAMIN) 1000 MCG tablet Take 1,000 mcg by mouth daily.  . [DISCONTINUED] amitriptyline (ELAVIL) 10 MG tablet TAKE 2 TABLETS(20 MG) BY MOUTH AT BEDTIME  . [DISCONTINUED] atenolol (TENORMIN) 50 MG tablet Take 50 mg by mouth daily.  . [DISCONTINUED] clopidogrel (PLAVIX) 75 MG tablet Take 75 mg by mouth daily.   . [DISCONTINUED] fenofibrate (TRICOR) 145 MG tablet Take 1 tablet (145 mg total) by mouth daily.  . [DISCONTINUED] JARDIANCE 25 MG TABS tablet Take 25 mg by mouth daily.  . [DISCONTINUED] metFORMIN (GLUCOPHAGE) 500 MG tablet Take 2 tablets (1,000 mg total) by mouth 2 (two) times daily with a meal.  . [DISCONTINUED] pantoprazole (PROTONIX) 40 MG tablet Take 1 tablet (40 mg total) by mouth daily.  . [DISCONTINUED] ramipril (ALTACE) 1.25 MG capsule Take 1.25 mg by mouth daily.  . [DISCONTINUED] rosuvastatin (CRESTOR) 40 MG tablet TAKE 1 TABLET(40 MG) BY MOUTH DAILY   No facility-administered encounter medications on file as of 01/23/2021.     Have you had any problems recently with your health?  Have you had any problems with your pharmacy?  What issues or side effects are you  having with your medications?  What would you like me to pass along to [facility] for them to help you with?   What can we do to take care of you better?  Star Rating Drugs: Glimepiride 4 mg last fill date unavailable Metformin 500mg  last filled 10/18/20 90 DS Rosuvastatin 40mg  last filled 12/20/20 90DS Ramipril 1.25mg  last filled 12/23/20 30DS Jardiance 25mg   Last filled 11/23/20 30DS   [facility] Clinical Pharmacist Assistant [phone]

## 2021-02-11 DIAGNOSIS — E1165 Type 2 diabetes mellitus with hyperglycemia: Secondary | ICD-10-CM | POA: Diagnosis not present

## 2021-02-11 DIAGNOSIS — Z794 Long term (current) use of insulin: Secondary | ICD-10-CM | POA: Diagnosis not present

## 2021-02-11 DIAGNOSIS — I10 Essential (primary) hypertension: Secondary | ICD-10-CM | POA: Diagnosis not present

## 2021-02-11 DIAGNOSIS — E782 Mixed hyperlipidemia: Secondary | ICD-10-CM | POA: Diagnosis not present

## 2021-03-14 ENCOUNTER — Ambulatory Visit (INDEPENDENT_AMBULATORY_CARE_PROVIDER_SITE_OTHER): Payer: Medicare HMO | Admitting: Nurse Practitioner

## 2021-03-14 ENCOUNTER — Other Ambulatory Visit: Payer: Self-pay

## 2021-03-14 ENCOUNTER — Encounter: Payer: Self-pay | Admitting: Nurse Practitioner

## 2021-03-14 VITALS — BP 131/65 | HR 61 | Temp 98.5°F | Wt 177.4 lb

## 2021-03-14 DIAGNOSIS — E785 Hyperlipidemia, unspecified: Secondary | ICD-10-CM

## 2021-03-14 DIAGNOSIS — Z794 Long term (current) use of insulin: Secondary | ICD-10-CM

## 2021-03-14 DIAGNOSIS — E1169 Type 2 diabetes mellitus with other specified complication: Secondary | ICD-10-CM

## 2021-03-14 DIAGNOSIS — E538 Deficiency of other specified B group vitamins: Secondary | ICD-10-CM

## 2021-03-14 DIAGNOSIS — I152 Hypertension secondary to endocrine disorders: Secondary | ICD-10-CM

## 2021-03-14 DIAGNOSIS — N183 Chronic kidney disease, stage 3 unspecified: Secondary | ICD-10-CM | POA: Diagnosis not present

## 2021-03-14 DIAGNOSIS — F5101 Primary insomnia: Secondary | ICD-10-CM

## 2021-03-14 DIAGNOSIS — E1159 Type 2 diabetes mellitus with other circulatory complications: Secondary | ICD-10-CM

## 2021-03-14 DIAGNOSIS — M65312 Trigger thumb, left thumb: Secondary | ICD-10-CM | POA: Insufficient documentation

## 2021-03-14 DIAGNOSIS — E1122 Type 2 diabetes mellitus with diabetic chronic kidney disease: Secondary | ICD-10-CM | POA: Diagnosis not present

## 2021-03-14 DIAGNOSIS — E1165 Type 2 diabetes mellitus with hyperglycemia: Secondary | ICD-10-CM

## 2021-03-14 DIAGNOSIS — E781 Pure hyperglyceridemia: Secondary | ICD-10-CM | POA: Diagnosis not present

## 2021-03-14 DIAGNOSIS — K219 Gastro-esophageal reflux disease without esophagitis: Secondary | ICD-10-CM | POA: Diagnosis not present

## 2021-03-14 NOTE — Assessment & Plan Note (Signed)
Chronic, ongoing.  Would benefit from ongoing discontinuation of Ambien due to age >24.  Is tolerating Amitriptyline.  Will continue this regimen at 20 MG.  In future if ongoing issues, could consider Belsomra.  Continue to monitor and adjust regimen as needed.  Recommend sleep hygiene techniques.

## 2021-03-14 NOTE — Assessment & Plan Note (Signed)
Chronic, stable on recent recheck.  Continue daily supplement and recheck level and CBC today.

## 2021-03-14 NOTE — Assessment & Plan Note (Signed)
Referral placed to ortho, may benefit from injections.

## 2021-03-14 NOTE — Progress Notes (Signed)
BP 131/65 (BP Location: Left Arm, Cuff Size: Normal)   Pulse 61   Temp 98.5 F (36.9 C) (Oral)   Wt 177 lb 6.4 oz (80.5 kg)   SpO2 98%   BMI 25.68 kg/m    Subjective:    Patient ID: [name], male    DOB: 1950-12-11, 69 y.o.   MRN: [id number]  HPI: [name] is a 70 y.o. male  Chief Complaint  Patient presents with   Diabetes    Diabetic Eye Exam is requested at today's visit.    Hyperlipidemia   Hypertension   Gastroesophageal Reflux   Trigger Finger    Patient states he would like to discuss treatment options for trigger finger. Patient states in the morning when he wakes up he will notices a stinging sensation that runs down his left thumb.    DIABETES Followed by endocrinology and last saw Dr. Honor Junes on 02/11/21 with A1C 7.6 % and Ozempic started -- he did not start due to cost $238 for one pen. Was diagnosed > 7 years ago.  Taking Metformin 1000 MG BID, Tresiba 70 units, Glimepiride 4 MG, and Jardiance 25 MG daily.  Returns to see Dr. Honor Junes in August.  Last labs noted eGFR 52 and trigs 646. Hypoglycemic episodes:no Polydipsia/polyuria: no Visual disturbance: no Chest pain: no Paresthesias: no Glucose Monitoring: yes             Accucheck frequency: Daily             Fasting glucose: averages 130-140 == 132 this morning             Post prandial:             Evening:             Before meals: Taking Insulin?: yes             Long acting insulin: Tresiba 70 units             Short acting insulin: Blood Pressure Monitoring: not checking Retinal Examination: Up To Date Foot Exam: Up to Date Pneumovax: Up to Date Influenza: Up to Date Aspirin: yes    HYPERTENSION / HYPERLIPIDEMIA Followed by cardiology, history of CABG in 1998, stent in 2006, catheterization in 2011.  Taking Crestor for HLD.  Last saw Dr. Ubaldo Glassing with cardiology on 10/10/2018, to continue dual anti-platelet therapy. Continues on Ramipril 1.25 MG, Atenolol 50 MG, ASA,  Fenofibrate (recently started), Lovaza, and Plavix. Last echo in 2019 and 55% .  Also has NTG for angina pain, has not used in a long time.  Returns to see cardiology July 5th.   Satisfied with current treatment? yes Duration of hypertension: chronic BP monitoring frequency: not checking BP range:  BP medication side effects: no Duration of hyperlipidemia: chronic Cholesterol medication side effects: no Cholesterol supplements: none Medication compliance: good compliance Aspirin: yes Recent stressors: no Recurrent headaches: no Visual changes: no Palpitations: no Dyspnea: no Chest pain: no Lower extremity edema: no Dizzy/lightheaded: no    INSOMNIA Taking Amitriptyline 20 MG at bedtime, has taken Ambien in past. Duration: chronic Satisfied with sleep quality: no Difficulty falling asleep: no Difficulty staying asleep: yes Waking a few hours after sleep onset: yes Early morning awakenings: no Daytime hypersomnolence: no Wakes feeling refreshed: yes Good sleep hygiene: yes Apnea: no Snoring: no Depressed/anxious mood: no Recent stress: no Restless legs/nocturnal leg cramps: no Chronic pain/arthritis: no History of sleep study: no Treatments attempted: [place]   GERD  Takes Protonix daily. No gall bladder, removed 15 years ago.  GERD control status: stable  Satisfied with current treatment? yes Heartburn frequency: none Medication side effects: no  Medication compliance: stable Previous GERD medications: Antacid use frequency:  none Dysphagia: no Odynophagia:  no Hematemesis: no Blood in stool: no EGD: no  TRIGGER FINGER Started about one month ago to left thumb.  In morning can barely move it, has to work it with other fingers to get started.  He is right hand dominant. Duration: weeks Involved hand: left Mechanism of injury: unknown Location: dorsal Onset: gradual Severity: 5/10  Quality: dull, aching, and throbbing Frequency: constant Radiation:  no Aggravating factors: nothing Alleviating factors: nothing Treatments attempted: none Relief with NSAIDs?: No NSAIDs Taken Weakness: no Numbness: no Redness: no Swelling:no Bruising: no Fevers: no   Relevant past medical, surgical, family and social history reviewed and updated as indicated. Interim medical history since our last visit reviewed. Allergies and medications reviewed and updated.  Review of Systems  Constitutional:  Negative for activity change, diaphoresis, fatigue and fever.  Respiratory:  Negative for cough, chest tightness, shortness of breath and wheezing.   Cardiovascular:  Negative for chest pain, palpitations and leg swelling.  Gastrointestinal: Negative.   Endocrine: Negative for polydipsia, polyphagia and polyuria.  Musculoskeletal:  Positive for arthralgias.  Neurological: Negative.   Psychiatric/Behavioral: Negative.     Per HPI unless specifically indicated above     Objective:    BP 131/65 (BP Location: Left Arm, Cuff Size: Normal)   Pulse 61   Temp 98.5 F (36.9 C) (Oral)   Wt 177 lb 6.4 oz (80.5 kg)   SpO2 98%   BMI 25.68 kg/m   Wt Readings from Last 3 Encounters:  03/14/21 177 lb 6.4 oz (80.5 kg)  12/10/20 177 lb (80.3 kg)  09/23/20 176 lb 3.2 oz (79.9 kg)    Physical Exam Vitals and nursing note reviewed.  Constitutional:      General: He is awake. He is not in acute distress.    Appearance: He is well-developed and well-groomed. He is not ill-appearing.  HENT:     Head: Normocephalic and atraumatic.     Right Ear: Hearing normal. No drainage.     Left Ear: Hearing normal. No drainage.  Eyes:     General: Lids are normal.        Right eye: No discharge.        Left eye: No discharge.     Conjunctiva/sclera: Conjunctivae normal.     Pupils: Pupils are equal, round, and reactive to light.  Neck:     Thyroid: No thyromegaly.     Vascular: No carotid bruit.  Cardiovascular:     Rate and Rhythm: Normal rate and regular rhythm.      Heart sounds: Normal heart sounds, S1 normal and S2 normal. No murmur heard.   No gallop.  Pulmonary:     Effort: Pulmonary effort is normal. No accessory muscle usage or respiratory distress.     Breath sounds: Normal breath sounds.  Abdominal:     General: Bowel sounds are normal.     Palpations: Abdomen is soft.  Musculoskeletal:     Right hand: Normal.     Left hand: No swelling, tenderness or bony tenderness. Decreased range of motion (left thumb catching and clickin with movement). Normal strength. Normal pulse.     Cervical back: Normal range of motion and neck supple.     Right lower leg: No edema.  Left lower leg: No edema.  Lymphadenopathy:     Cervical: No cervical adenopathy.  Skin:    General: Skin is warm and dry.     Capillary Refill: Capillary refill takes less than 2 seconds.  Neurological:     Mental Status: He is alert and oriented to person, place, and time.     Deep Tendon Reflexes: Reflexes are normal and symmetric.     Reflex Scores:      Brachioradialis reflexes are 2+ on the right side and 2+ on the left side.      Patellar reflexes are 2+ on the right side and 2+ on the left side. Psychiatric:        Attention and Perception: Attention normal.        Mood and Affect: Mood normal.        Speech: Speech normal.        Behavior: Behavior normal. Behavior is cooperative.        Thought Content: Thought content normal.   Simple Foot Form Visual Inspection No deformities, no ulcerations, no other skin breakdown bilaterally: Yes Sensation Testing Intact to touch and monofilament testing bilaterally: Yes Pulse Check Posterior Tibialis and Dorsalis pulse intact bilaterally: Yes Comments     Results for orders placed or performed in visit on 12/10/20  Comprehensive metabolic panel  Result Value Ref Range   Glucose 115 (H) 65 - 99 mg/dL   BUN 18 8 - 27 mg/dL   Creatinine, Ser 1.43 (H) 0.76 - 1.27 mg/dL   eGFR 52 (L) >59 mL/min/1.73    BUN/Creatinine Ratio 13 10 - 24   Sodium 140 134 - 144 mmol/L   Potassium 4.5 3.5 - 5.2 mmol/L   Chloride 102 96 - 106 mmol/L   CO2 20 20 - 29 mmol/L   Calcium 9.3 8.6 - 10.2 mg/dL   Total Protein 6.6 6.0 - 8.5 g/dL   Albumin 4.6 3.7 - 4.7 g/dL   Globulin, Total 2.0 1.5 - 4.5 g/dL   Albumin/Globulin Ratio 2.3 (H) 1.2 - 2.2   Bilirubin Total 0.3 0.0 - 1.2 mg/dL   Alkaline Phosphatase 66 44 - 121 IU/L   AST 18 0 - 40 IU/L   ALT 15 0 - 44 IU/L  Lipid Panel w/o Chol/HDL Ratio  Result Value Ref Range   Cholesterol, Total 198 100 - 199 mg/dL   Triglycerides 646 (HH) 0 - 149 mg/dL   HDL 32 (L) >39 mg/dL   VLDL Cholesterol Cal 99 (H) 5 - 40 mg/dL   LDL Chol Calc (NIH) 67 0 - 99 mg/dL  Microalbumin, Urine Waived  Result Value Ref Range   Microalb, Ur Waived 30 (H) 0 - 19 mg/L   Creatinine, Urine Waived 200 10 - 300 mg/dL   Microalb/Creat Ratio <30 <30 mg/g      Assessment & Plan:   Problem List Items Addressed This Visit       Cardiovascular and Mediastinum   Hypertension associated with type 2 diabetes mellitus (HCC)    Chronic, ongoing with BP at goal.  Goal <130/80.  Will continue current medication regimen and cardiology collaboration.  Recommend he monitor BP at home at least a few mornings a week and document for provider + focus on DASH diet.  Could consider addition of Amlodipine if elevation in SBP noted.  BMP today.  Recommend he return to cardiology for yearly visit.  Return in 3 months.       Relevant Orders   Basic metabolic  panel   CBC with Differential/Platelet   TSH     Digestive   GERD (gastroesophageal reflux disease)    Chronic, ongoing, stable with daily PPI.  Would benefit from trial reduction in future, discussed with patient.  At this time maintain and monitor.  Mag level next visit.         Endocrine   Hyperlipidemia associated with type 2 diabetes mellitus (HCC)    Chronic, ongoing.  Continue Crestor, tolerating at this time.  Adjust dose as  needed.  Lipid panel today.       Relevant Orders   Lipid Panel w/o Chol/HDL Ratio   Type 2 diabetes mellitus with hyperglycemia, with long-term current use of insulin (HCC) - Primary    Chronic, ongoing.  Followed by endocrinology with recent A1C 7.6%.  They started Ozempic recently, but he never started due to cost.  Continue current medication regimen as prescribed by endocrinology, notes reviewed.  CCM collaboration continues -- may need help with GLP1.  Urine ALB check 30 in March 2022, continue Ramipril for protection.  Recommend he check BS at least twice a day, fasting and 2 hours after a meal.  Return in 3 months for follow-up.       CKD stage 3 due to type 2 diabetes mellitus (HCC)    Ongoing, stable.  Recheck BMP today and monitor.  Continue Ramipril for kidney protection.  Consider nephrology referral if decline ever noted.  Most recent A1c 7.6% with endocrinology, continue this collaboration.         Musculoskeletal and Integument   Trigger finger of left thumb    Referral placed to ortho, may benefit from injections.       Relevant Orders   Ambulatory referral to Orthopedics     Other   Insomnia    Chronic, ongoing.  Would benefit from ongoing discontinuation of Ambien due to age >24.  Is tolerating Amitriptyline.  Will continue this regimen at 20 MG.  In future if ongoing issues, could consider Belsomra.  Continue to monitor and adjust regimen as needed.  Recommend sleep hygiene techniques.       Vitamin B12 deficiency    Chronic, stable on recent recheck.  Continue daily supplement and recheck level and CBC today.       Relevant Orders   Vitamin B12   Hypertriglyceridemia    Ongoing issue with statin, Fenofibrate, and Fish Oil on board, will recheck fasting today and continue collaboration with cardiology.         Follow up plan: Return in about 3 months (around 06/14/2021) for T2DM, HTN/HLD, GERD.

## 2021-03-14 NOTE — Assessment & Plan Note (Signed)
Chronic, ongoing.  Followed by endocrinology with recent A1C 7.6%.  They started Ozempic recently, but he never started due to cost.  Continue current medication regimen as prescribed by endocrinology, notes reviewed.  CCM collaboration continues -- may need help with GLP1.  Urine ALB check 30 in March 2022, continue Ramipril for protection.  Recommend he check BS at least twice a day, fasting and 2 hours after a meal.  Return in 3 months for follow-up.

## 2021-03-14 NOTE — Assessment & Plan Note (Signed)
Chronic, ongoing with BP at goal.  Goal <130/80.  Will continue current medication regimen and cardiology collaboration.  Recommend he monitor BP at home at least a few mornings a week and document for provider + focus on DASH diet.  Could consider addition of Amlodipine if elevation in SBP noted.  BMP today.  Recommend he return to cardiology for yearly visit.  Return in 3 months.

## 2021-03-14 NOTE — Assessment & Plan Note (Signed)
Chronic, ongoing, stable with daily PPI.  Would benefit from trial reduction in future, discussed with patient.  At this time maintain and monitor.  Mag level next visit.

## 2021-03-14 NOTE — Patient Instructions (Signed)
Diabetes Mellitus and Nutrition, Adult When you have diabetes, or diabetes mellitus, it is very important to have healthy eating habits because your blood sugar (glucose) levels are greatly affected by what you eat and drink. Eating healthy foods in the right amounts, at about the same times every day, can help you:  Control your blood glucose.  Lower your risk of heart disease.  Improve your blood pressure.  Reach or maintain a healthy weight. What can affect my meal plan? Every person with diabetes is different, and each person has different needs for a meal plan. Your health care provider may recommend that you work with a dietitian to make a meal plan that is best for you. Your meal plan may vary depending on factors such as:  The calories you need.  The medicines you take.  Your weight.  Your blood glucose, blood pressure, and cholesterol levels.  Your activity level.  Other health conditions you have, such as heart or kidney disease. How do carbohydrates affect me? Carbohydrates, also called carbs, affect your blood glucose level more than any other type of food. Eating carbs naturally raises the amount of glucose in your blood. Carb counting is a method for keeping track of how many carbs you eat. Counting carbs is important to keep your blood glucose at a healthy level, especially if you use insulin or take certain oral diabetes medicines. It is important to know how many carbs you can safely have in each meal. This is different for every person. Your dietitian can help you calculate how many carbs you should have at each meal and for each snack. How does alcohol affect me? Alcohol can cause a sudden decrease in blood glucose (hypoglycemia), especially if you use insulin or take certain oral diabetes medicines. Hypoglycemia can be a life-threatening condition. Symptoms of hypoglycemia, such as sleepiness, dizziness, and confusion, are similar to symptoms of having too much  alcohol.  Do not drink alcohol if: ? Your health care provider tells you not to drink. ? You are pregnant, may be pregnant, or are planning to become pregnant.  If you drink alcohol: ? Do not drink on an empty stomach. ? Limit how much you use to:  0-1 drink a day for women.  0-2 drinks a day for men. ? Be aware of how much alcohol is in your drink. In the [place] one drink equals one 12 oz bottle of beer (355 mL), one 5 oz glass of wine (148 mL), or one 1 oz glass of hard liquor (44 mL). ? Keep yourself hydrated with water, diet soda, or unsweetened iced tea.  Keep in mind that regular soda, juice, and other mixers may contain a lot of sugar and must be counted as carbs. What are tips for following this plan? Reading food labels  Start by checking the serving size on the "Nutrition Facts" label of packaged foods and drinks. The amount of calories, carbs, fats, and other nutrients listed on the label is based on one serving of the item. Many items contain more than one serving per package.  Check the total grams (g) of carbs in one serving. You can calculate the number of servings of carbs in one serving by dividing the total carbs by 15. For example, if a food has 30 g of total carbs per serving, it would be equal to 2 servings of carbs.  Check the number of grams (g) of saturated fats and trans fats in one serving. Choose foods that have   a low amount or none of these fats.  Check the number of milligrams (mg) of salt (sodium) in one serving. Most people should limit total sodium intake to less than 2,300 mg per day.  Always check the nutrition information of foods labeled as "low-fat" or "nonfat." These foods may be higher in added sugar or refined carbs and should be avoided.  Talk to your dietitian to identify your daily goals for nutrients listed on the label. Shopping  Avoid buying canned, pre-made, or processed foods. These foods tend to be high in fat, sodium, and added  sugar.  Shop around the outside edge of the grocery store. This is where you will most often find fresh fruits and vegetables, bulk grains, fresh meats, and fresh dairy. Cooking  Use low-heat cooking methods, such as baking, instead of high-heat cooking methods like deep frying.  Cook using healthy oils, such as olive, canola, or sunflower oil.  Avoid cooking with butter, cream, or high-fat meats. Meal planning  Eat meals and snacks regularly, preferably at the same times every day. Avoid going long periods of time without eating.  Eat foods that are high in fiber, such as fresh fruits, vegetables, beans, and whole grains. Talk with your dietitian about how many servings of carbs you can eat at each meal.  Eat 4-6 oz (112-168 g) of lean protein each day, such as lean meat, chicken, fish, eggs, or tofu. One ounce (oz) of lean protein is equal to: ? 1 oz (28 g) of meat, chicken, or fish. ? 1 egg. ?  cup (62 g) of tofu.  Eat some foods each day that contain healthy fats, such as avocado, nuts, seeds, and fish.   What foods should I eat? Fruits Berries. Apples. Oranges. Peaches. Apricots. Plums. Grapes. Mango. Papaya. Pomegranate. Kiwi. Cherries. Vegetables Lettuce. Spinach. Leafy greens, including kale, chard, collard greens, and mustard greens. Beets. Cauliflower. Cabbage. Broccoli. Carrots. Green beans. Tomatoes. Peppers. Onions. Cucumbers. Brussels sprouts. Grains Whole grains, such as whole-wheat or whole-grain bread, crackers, tortillas, cereal, and pasta. Unsweetened oatmeal. Quinoa. Brown or wild rice. Meats and other proteins Seafood. Poultry without skin. Lean cuts of poultry and beef. Tofu. Nuts. Seeds. Dairy Low-fat or fat-free dairy products such as milk, yogurt, and cheese. The items listed above may not be a complete list of foods and beverages you can eat. Contact a dietitian for more information. What foods should I avoid? Fruits Fruits canned with  syrup. Vegetables Canned vegetables. Frozen vegetables with butter or cream sauce. Grains Refined white flour and flour products such as bread, pasta, snack foods, and cereals. Avoid all processed foods. Meats and other proteins Fatty cuts of meat. Poultry with skin. Breaded or fried meats. Processed meat. Avoid saturated fats. Dairy Full-fat yogurt, cheese, or milk. Beverages Sweetened drinks, such as soda or iced tea. The items listed above may not be a complete list of foods and beverages you should avoid. Contact a dietitian for more information. Questions to ask a health care provider  Do I need to meet with a diabetes educator?  Do I need to meet with a dietitian?  What number can I call if I have questions?  When are the best times to check my blood glucose? Where to find more information:  American Diabetes Association: diabetes.org  Academy of Nutrition and Dietetics: www.eatright.org  National Institute of Diabetes and Digestive and Kidney Diseases: www.niddk.nih.gov  Association of Diabetes Care and Education Specialists: www.diabeteseducator.org Summary  It is important to have healthy eating   habits because your blood sugar (glucose) levels are greatly affected by what you eat and drink.  A healthy meal plan will help you control your blood glucose and maintain a healthy lifestyle.  Your health care provider may recommend that you work with a dietitian to make a meal plan that is best for you.  Keep in mind that carbohydrates (carbs) and alcohol have immediate effects on your blood glucose levels. It is important to count carbs and to use alcohol carefully. This information is not intended to replace advice given to you by your health care provider. Make sure you discuss any questions you have with your health care provider. Document Revised: 08/08/2019 Document Reviewed: 08/08/2019 Elsevier Patient Education  2021 Elsevier Inc.  

## 2021-03-14 NOTE — Assessment & Plan Note (Signed)
Ongoing, stable.  Recheck BMP today and monitor.  Continue Ramipril for kidney protection.  Consider nephrology referral if decline ever noted.  Most recent A1c 7.6% with endocrinology, continue this collaboration.

## 2021-03-14 NOTE — Assessment & Plan Note (Signed)
Ongoing issue with statin, Fenofibrate, and Fish Oil on board, will recheck fasting today and continue collaboration with cardiology. 

## 2021-03-14 NOTE — Assessment & Plan Note (Signed)
Chronic, ongoing.  Continue Crestor, tolerating at this time.  Adjust dose as needed.  Lipid panel today.

## 2021-03-15 LAB — BASIC METABOLIC PANEL
BUN/Creatinine Ratio: 13 (ref 10–24)
BUN: 23 mg/dL (ref 8–27)
CO2: 20 mmol/L (ref 20–29)
Calcium: 9 mg/dL (ref 8.6–10.2)
Chloride: 100 mmol/L (ref 96–106)
Creatinine, Ser: 1.81 mg/dL — ABNORMAL HIGH (ref 0.76–1.27)
Glucose: 115 mg/dL — ABNORMAL HIGH (ref 65–99)
Potassium: 4.7 mmol/L (ref 3.5–5.2)
Sodium: 134 mmol/L (ref 134–144)
eGFR: 39 mL/min/{1.73_m2} — ABNORMAL LOW (ref >59)

## 2021-03-15 LAB — CBC WITH DIFFERENTIAL/PLATELET
Basophils Absolute: 0 10*3/uL (ref 0.0–0.2)
Basos: 0 %
EOS (ABSOLUTE): 0.3 10*3/uL (ref 0.0–0.4)
Eos: 5 %
Hematocrit: 43.7 % (ref 37.5–51.0)
Hemoglobin: 14.4 g/dL (ref 13.0–17.7)
Immature Grans (Abs): 0 10*3/uL (ref 0.0–0.1)
Immature Granulocytes: 0 %
Lymphocytes Absolute: 1.8 10*3/uL (ref 0.7–3.1)
Lymphs: 39 %
MCH: 28.7 pg (ref 26.6–33.0)
MCHC: 33 g/dL (ref 31.5–35.7)
MCV: 87 fL (ref 79–97)
Monocytes Absolute: 0.3 10*3/uL (ref 0.1–0.9)
Monocytes: 6 %
Neutrophils Absolute: 2.2 10*3/uL (ref 1.4–7.0)
Neutrophils: 50 %
Platelets: 215 10*3/uL (ref 150–450)
RBC: 5.01 x10E6/uL (ref 4.14–5.80)
RDW: 14.9 % (ref 11.6–15.4)
WBC: 4.6 10*3/uL (ref 3.4–10.8)

## 2021-03-15 LAB — LIPID PANEL W/O CHOL/HDL RATIO
Cholesterol, Total: 166 mg/dL (ref 100–199)
HDL: 34 mg/dL — ABNORMAL LOW (ref >39)
LDL Chol Calc (NIH): 68 mg/dL (ref 0–99)
Triglycerides: 411 mg/dL — ABNORMAL HIGH (ref 0–149)
VLDL Cholesterol Cal: 64 mg/dL — ABNORMAL HIGH (ref 5–40)

## 2021-03-15 LAB — VITAMIN B12: Vitamin B-12: 205 pg/mL — ABNORMAL LOW (ref 232–1245)

## 2021-03-15 LAB — TSH: TSH: 0.803 u[IU]/mL (ref 0.450–4.500)

## 2021-03-16 ENCOUNTER — Other Ambulatory Visit: Payer: Self-pay | Admitting: Nurse Practitioner

## 2021-03-16 DIAGNOSIS — N183 Chronic kidney disease, stage 3 unspecified: Secondary | ICD-10-CM

## 2021-03-16 NOTE — Progress Notes (Signed)
Contacted via MyChart  -- needs 4 week lab visit only scheduled   Good morning [name], your labs have returned: - I will start out with kidney function, your creatinine and eGFR are showing some worsening kidney disease.  I would like you to come in 4 weeks to recheck this because if the eGFR remains <45 then we will need to reduce your Metformin dosing. If any worsening decline then will need to send you to kidney doctor. - CBC shows no anemia and TSH is normal.   - Triglycerides are still elevated, but are trending down, continue all medications. - B12 level is on low side again at 205, please ensure you are taking Vitamin B12 oral supplement 1000 MCG daily as this is important for nervous system health and Metformin can reduce B12 in body.  Any questions?  Please call Tuesday and schedule a 4 week lab visit only. Keep being awesome!!  Thank you for allowing me to participate in your care.  I appreciate you. Kindest regards, Benjamyn Hestand

## 2021-03-18 DIAGNOSIS — E782 Mixed hyperlipidemia: Secondary | ICD-10-CM | POA: Diagnosis not present

## 2021-03-18 DIAGNOSIS — E1169 Type 2 diabetes mellitus with other specified complication: Secondary | ICD-10-CM | POA: Diagnosis not present

## 2021-03-18 DIAGNOSIS — I2510 Atherosclerotic heart disease of native coronary artery without angina pectoris: Secondary | ICD-10-CM | POA: Diagnosis not present

## 2021-03-18 DIAGNOSIS — E785 Hyperlipidemia, unspecified: Secondary | ICD-10-CM | POA: Diagnosis not present

## 2021-03-18 DIAGNOSIS — I10 Essential (primary) hypertension: Secondary | ICD-10-CM | POA: Diagnosis not present

## 2021-03-21 ENCOUNTER — Telehealth: Payer: Self-pay

## 2021-03-21 NOTE — Telephone Encounter (Signed)
Called pt to schedule 4 week lab appt no answer left vm

## 2021-03-25 DIAGNOSIS — M65312 Trigger thumb, left thumb: Secondary | ICD-10-CM | POA: Diagnosis not present

## 2021-03-28 ENCOUNTER — Other Ambulatory Visit: Payer: Self-pay

## 2021-03-28 ENCOUNTER — Other Ambulatory Visit: Payer: Medicare HMO

## 2021-03-28 DIAGNOSIS — N183 Chronic kidney disease, stage 3 unspecified: Secondary | ICD-10-CM | POA: Diagnosis not present

## 2021-03-28 DIAGNOSIS — E1122 Type 2 diabetes mellitus with diabetic chronic kidney disease: Secondary | ICD-10-CM

## 2021-03-28 DIAGNOSIS — D519 Vitamin B12 deficiency anemia, unspecified: Secondary | ICD-10-CM | POA: Diagnosis not present

## 2021-03-29 LAB — BASIC METABOLIC PANEL
BUN/Creatinine Ratio: 11 (ref 10–24)
BUN: 18 mg/dL (ref 8–27)
CO2: 21 mmol/L (ref 20–29)
Calcium: 9.3 mg/dL (ref 8.6–10.2)
Chloride: 109 mmol/L — ABNORMAL HIGH (ref 96–106)
Creatinine, Ser: 1.66 mg/dL — ABNORMAL HIGH (ref 0.76–1.27)
Glucose: 121 mg/dL — ABNORMAL HIGH (ref 65–99)
Potassium: 4.3 mmol/L (ref 3.5–5.2)
Sodium: 143 mmol/L (ref 134–144)
eGFR: 44 mL/min/{1.73_m2} — ABNORMAL LOW (ref >59)

## 2021-03-29 LAB — VITAMIN B12: Vitamin B-12: 682 pg/mL (ref 232–1245)

## 2021-03-29 NOTE — Progress Notes (Signed)
Contacted via [place] morning [place], your labs have returned. B12 is trending upwards, continues supplement daily.  Kidney function has trended upwards this check, GFR is barely below 45 --- now at 44.  I would recommend ensuring you are drinking good amounts of water daily and we will recheck this next visit, if this remains less then 45 we will need to reduce your Metformin to 500 MG twice a day.  I would alert your endocrinologist too if you see them before me.  We will cross our fingers we can maintain you are current regimen.  Any questions? Keep being awesome!!  Thank you for allowing me to participate in your care.  I appreciate you. Kindest regards, Raena Pau

## 2021-04-09 NOTE — Telephone Encounter (Signed)
Pt came in 7/15

## 2021-05-06 ENCOUNTER — Telehealth: Payer: Self-pay

## 2021-05-06 NOTE — Progress Notes (Signed)
Chronic Care Management Pharmacy Assistant   Name: [name]  MRN: [place]:[id number] DOB: [birth year]   Reason for Encounter: General Assessment    Recent office visits:  03/14/21 Brandon Guarneri NP - Diabetes follow up - Referral to ortho - No medication changes - Follow up in 3 months   Recent consult visits:  02/11/21 Brandon Forester NP - Endocrinolgy - Follow up diabetes -  Started Ozempic 0.25 mg, increase to 0.5 mg - Follow up in 3 months 03/18/21 [name] - Cardiology - No medication changes - Follow up in one year  Hospital visits:  None in previous 6 months  Medications: Outpatient Encounter Medications as of 05/06/2021  Medication Sig   albuterol (VENTOLIN HFA) 108 (90 Base) MCG/ACT inhaler Inhale 2 puffs into the lungs every 6 (six) hours as needed for wheezing or shortness of breath.   amitriptyline (ELAVIL) 10 MG tablet TAKE 2 TABLETS(20 MG) BY MOUTH AT BEDTIME   aspirin EC 81 MG tablet Take 81 mg by mouth daily.    atenolol (TENORMIN) 50 MG tablet Take 1 tablet (50 mg total) by mouth daily.   benzonatate (TESSALON PERLES) 100 MG capsule Take 1 capsule (100 mg total) by mouth 3 (three) times daily as needed for cough.   clopidogrel (PLAVIX) 75 MG tablet Take 1 tablet (75 mg total) by mouth daily.   Continuous Blood Gluc Sensor (FREESTYLE LIBRE 14 DAY SENSOR) MISC Apply topically every 14 (fourteen) days.   fenofibrate (TRICOR) 145 MG tablet Take 1 tablet (145 mg total) by mouth daily.   glimepiride (AMARYL) 4 MG tablet Take by mouth.   insulin degludec (TRESIBA FLEXTOUCH) 100 UNIT/ML FlexTouch Pen Inject 70 Units into the skin daily.    JARDIANCE 25 MG TABS tablet Take 1 tablet (25 mg total) by mouth daily.   metFORMIN (GLUCOPHAGE) 500 MG tablet Take 2 tablets (1,000 mg total) by mouth 2 (two) times daily with a meal.   nitroGLYCERIN (NITROSTAT) 0.4 MG SL tablet Place 0.4 mg under the tongue every 5 (five) minutes as needed.   omega-3 acid ethyl esters (LOVAZA) 1 g  capsule Take 2 capsules (2 g total) by mouth 2 (two) times daily.   pantoprazole (PROTONIX) 40 MG tablet Take 1 tablet (40 mg total) by mouth daily.   ramipril (ALTACE) 1.25 MG capsule Take 1 capsule (1.25 mg total) by mouth daily.   rosuvastatin (CRESTOR) 40 MG tablet TAKE 1 TABLET(40 MG) BY MOUTH DAILY   vitamin B-12 (CYANOCOBALAMIN) 1000 MCG tablet Take 1,000 mcg by mouth daily.   No facility-administered encounter medications on file as of 05/06/2021.   Have you had any problems recently with your health? Patient states that he has no concerns at this time.   Have you had any problems with your pharmacy? Patient states that he has no concerns with his pharmacy.   What issues or side effects are you having with your medications? Patient states that he has no issues with medication currently.   What would you like me to pass along to [name],CPP for them to help you with?  N/a  What can we do to take care of you better? N/a  Misc. Comment: [name] a very pleasant man states that he likes to play cornhole 2-3 times a week, and informed me that he is doing well, that he has no pain, he eats well, he sleeps well, and has no concerns to report at this time   Star Rating Drugs: Ramipril 1.25  mg Last filled 01/27/21 90 DS Rosuvastatin 40 mg Last filled 02/26/21 90 DS Jardiance 25 mg  Metformin  500 mg Last filled 03/22/21 90 DS Glimepiride 4 mg    [name], CMA

## 2021-05-13 DIAGNOSIS — M65312 Trigger thumb, left thumb: Secondary | ICD-10-CM | POA: Diagnosis not present

## 2021-05-14 ENCOUNTER — Ambulatory Visit: Payer: Medicare Other

## 2021-05-16 ENCOUNTER — Ambulatory Visit (INDEPENDENT_AMBULATORY_CARE_PROVIDER_SITE_OTHER): Payer: Medicare HMO

## 2021-05-16 VITALS — Ht 72.0 in | Wt 180.0 lb

## 2021-05-16 DIAGNOSIS — Z0000 Encounter for general adult medical examination without abnormal findings: Secondary | ICD-10-CM

## 2021-05-16 NOTE — Patient Instructions (Signed)
[name] , Thank you for taking time to come for your Medicare Wellness Visit. I appreciate your ongoing commitment to your health goals. Please review the following plan we discussed and let me know if I can assist you in the future.   Screening recommendations/referrals: Colonoscopy: completed 12/13/2017 Recommended yearly ophthalmology/optometry visit for glaucoma screening and checkup Recommended yearly dental visit for hygiene and checkup  Vaccinations: Influenza vaccine: due Pneumococcal vaccine: completed 06/25/2015 Tdap vaccine: completed 06/16/2018, due 06/16/2028 Shingles vaccine: discussed   Covid-19:  11/26/2019, 11/04/2019  Advanced directives: Advance directive discussed with you today.   Conditions/risks identified: none  Next appointment: Follow up in one year for your annual wellness visit.   Preventive Care 72 Years and Older, Male Preventive care refers to lifestyle choices and visits with your health care provider that can promote health and wellness. What does preventive care include? A yearly physical exam. This is also called an annual well check. Dental exams once or twice a year. Routine eye exams. Ask your health care provider how often you should have your eyes checked. Personal lifestyle choices, including: Daily care of your teeth and gums. Regular physical activity. Eating a healthy diet. Avoiding tobacco and drug use. Limiting alcohol use. Practicing safe sex. Taking low doses of aspirin every day. Taking vitamin and mineral supplements as recommended by your health care provider. What happens during an annual well check? The services and screenings done by your health care provider during your annual well check will depend on your age, overall health, lifestyle risk factors, and family history of disease. Counseling  Your health care provider may ask you questions about your: Alcohol use. Tobacco use. Drug use. Emotional well-being. Home and  relationship well-being. Sexual activity. Eating habits. History of falls. Memory and ability to understand (cognition). Work and work Statistician. Screening  You may have the following tests or measurements: Height, weight, and BMI. Blood pressure. Lipid and cholesterol levels. These may be checked every 5 years, or more frequently if you are over 40 years old. Skin check. Lung cancer screening. You may have this screening every year starting at age 23 if you have a 30-pack-year history of smoking and currently smoke or have quit within the past 15 years. Fecal occult blood test (FOBT) of the stool. You may have this test every year starting at age 61. Flexible sigmoidoscopy or colonoscopy. You may have a sigmoidoscopy every 5 years or a colonoscopy every 10 years starting at age 10. Prostate cancer screening. Recommendations will vary depending on your family history and other risks. Hepatitis C blood test. Hepatitis B blood test. Sexually transmitted disease (STD) testing. Diabetes screening. This is done by checking your blood sugar (glucose) after you have not eaten for a while (fasting). You may have this done every 1-3 years. Abdominal aortic aneurysm (AAA) screening. You may need this if you are a current or former smoker. Osteoporosis. You may be screened starting at age 32 if you are at high risk. Talk with your health care provider about your test results, treatment options, and if necessary, the need for more tests. Vaccines  Your health care provider may recommend certain vaccines, such as: Influenza vaccine. This is recommended every year. Tetanus, diphtheria, and acellular pertussis (Tdap, Td) vaccine. You may need a Td booster every 10 years. Zoster vaccine. You may need this after age 72. Pneumococcal 13-valent conjugate (PCV13) vaccine. One dose is recommended after age 2. Pneumococcal polysaccharide (PPSV23) vaccine. One dose is recommended after age  64. Talk to your  health care provider about which screenings and vaccines you need and how often you need them. This information is not intended to replace advice given to you by your health care provider. Make sure you discuss any questions you have with your health care provider. Document Released: 09/27/2015 Document Revised: 05/20/2016 Document Reviewed: 07/02/2015 Elsevier Interactive Patient Education  2017 [place] Prevention in the Home Falls can cause injuries. They can happen to people of all ages. There are many things you can do to make your home safe and to help prevent falls. What can I do on the outside of my home? Regularly fix the edges of walkways and driveways and fix any cracks. Remove anything that might make you trip as you walk through a door, such as a raised step or threshold. Trim any bushes or trees on the path to your home. Use bright outdoor lighting. Clear any walking paths of anything that might make someone trip, such as rocks or tools. Regularly check to see if handrails are loose or broken. Make sure that both sides of any steps have handrails. Any raised decks and porches should have guardrails on the edges. Have any leaves, snow, or ice cleared regularly. Use sand or salt on walking paths during winter. Clean up any spills in your garage right away. This includes oil or grease spills. What can I do in the bathroom? Use night lights. Install grab bars by the toilet and in the tub and shower. Do not use towel bars as grab bars. Use non-skid mats or decals in the tub or shower. If you need to sit down in the shower, use a plastic, non-slip stool. Keep the floor dry. Clean up any water that spills on the floor as soon as it happens. Remove soap buildup in the tub or shower regularly. Attach bath mats securely with double-sided non-slip rug tape. Do not have throw rugs and other things on the floor that can make you trip. What can I do in the bedroom? Use night  lights. Make sure that you have a light by your bed that is easy to reach. Do not use any sheets or blankets that are too big for your bed. They should not hang down onto the floor. Have a firm chair that has side arms. You can use this for support while you get dressed. Do not have throw rugs and other things on the floor that can make you trip. What can I do in the kitchen? Clean up any spills right away. Avoid walking on wet floors. Keep items that you use a lot in easy-to-reach places. If you need to reach something above you, use a strong step stool that has a grab bar. Keep electrical cords out of the way. Do not use floor polish or wax that makes floors slippery. If you must use wax, use non-skid floor wax. Do not have throw rugs and other things on the floor that can make you trip. What can I do with my stairs? Do not leave any items on the stairs. Make sure that there are handrails on both sides of the stairs and use them. Fix handrails that are broken or loose. Make sure that handrails are as long as the stairways. Check any carpeting to make sure that it is firmly attached to the stairs. Fix any carpet that is loose or worn. Avoid having throw rugs at the top or bottom of the stairs. If you do have  throw rugs, attach them to the floor with carpet tape. Make sure that you have a light switch at the top of the stairs and the bottom of the stairs. If you do not have them, ask someone to add them for you. What else can I do to help prevent falls? Wear shoes that: Do not have high heels. Have rubber bottoms. Are comfortable and fit you well. Are closed at the toe. Do not wear sandals. If you use a stepladder: Make sure that it is fully opened. Do not climb a closed stepladder. Make sure that both sides of the stepladder are locked into place. Ask someone to hold it for you, if possible. Clearly mark and make sure that you can see: Any grab bars or handrails. First and last  steps. Where the edge of each step is. Use tools that help you move around (mobility aids) if they are needed. These include: Canes. Walkers. Scooters. Crutches. Turn on the lights when you go into a dark area. Replace any light bulbs as soon as they burn out. Set up your furniture so you have a clear path. Avoid moving your furniture around. If any of your floors are uneven, fix them. If there are any pets around you, be aware of where they are. Review your medicines with your doctor. Some medicines can make you feel dizzy. This can increase your chance of falling. Ask your doctor what other things that you can do to help prevent falls. This information is not intended to replace advice given to you by your health care provider. Make sure you discuss any questions you have with your health care provider. Document Released: 06/27/2009 Document Revised: 02/06/2016 Document Reviewed: 10/05/2014 Elsevier Interactive Patient Education  2017 [employer].

## 2021-05-16 NOTE — Progress Notes (Signed)
I connected with [name] today by telephone and verified that I am speaking with the correct person using two identifiers. Location patient: home Location provider: work Persons participating in the virtual visit: Kent Grabinski, Glenna Durand LPN.   I discussed the limitations, risks, security and privacy concerns of performing an evaluation and management service by telephone and the availability of in person appointments. I also discussed with the patient that there may be a patient responsible charge related to this service. The patient expressed understanding and verbally consented to this telephonic visit.    Interactive audio and video telecommunications were attempted between this provider and patient, however failed, due to patient having technical difficulties OR patient did not have access to video capability.  We continued and completed visit with audio only.     Vital signs may be patient reported or missing.  Subjective:   [name] is a 70 y.o. male who presents for Medicare Annual/Subsequent preventive examination.  Review of Systems     Cardiac Risk Factors include: advanced age (>37mn, >>51women);diabetes mellitus;dyslipidemia;hypertension;male gender;sedentary lifestyle     Objective:    Today's Vitals   05/16/21 0901  Weight: 180 lb (81.6 kg)  Height: 6' (1.829 m)   Body mass index is 24.41 kg/m.  Advanced Directives 05/16/2021 05/13/2020 02/10/2020 02/07/2019 06/01/2018 02/22/2018 12/13/2017  Does Patient Have a Medical Advance Directive? No No No No No No No  Would patient like information on creating a medical advance directive? - - - - No - Patient declined No - Patient declined No - Patient declined    Current Medications (verified) Outpatient Encounter Medications as of 05/16/2021  Medication Sig   amitriptyline (ELAVIL) 10 MG tablet TAKE 2 TABLETS(20 MG) BY MOUTH AT BEDTIME   aspirin EC 81 MG tablet Take 81 mg by mouth daily.    atenolol  (TENORMIN) 50 MG tablet Take 1 tablet (50 mg total) by mouth daily.   clopidogrel (PLAVIX) 75 MG tablet Take 1 tablet (75 mg total) by mouth daily.   fenofibrate (TRICOR) 145 MG tablet Take 1 tablet (145 mg total) by mouth daily.   glimepiride (AMARYL) 4 MG tablet Take by mouth.   insulin degludec (TRESIBA FLEXTOUCH) 100 UNIT/ML FlexTouch Pen Inject 70 Units into the skin daily.    JARDIANCE 25 MG TABS tablet Take 1 tablet (25 mg total) by mouth daily.   metFORMIN (GLUCOPHAGE) 500 MG tablet Take 2 tablets (1,000 mg total) by mouth 2 (two) times daily with a meal.   nitroGLYCERIN (NITROSTAT) 0.4 MG SL tablet Place 0.4 mg under the tongue every 5 (five) minutes as needed.   omega-3 acid ethyl esters (LOVAZA) 1 g capsule Take 2 capsules (2 g total) by mouth 2 (two) times daily.   pantoprazole (PROTONIX) 40 MG tablet Take 1 tablet (40 mg total) by mouth daily.   ramipril (ALTACE) 1.25 MG capsule Take 1 capsule (1.25 mg total) by mouth daily.   rosuvastatin (CRESTOR) 40 MG tablet TAKE 1 TABLET(40 MG) BY MOUTH DAILY   vitamin B-12 (CYANOCOBALAMIN) 1000 MCG tablet Take 1,000 mcg by mouth daily.   albuterol (VENTOLIN HFA) 108 (90 Base) MCG/ACT inhaler Inhale 2 puffs into the lungs every 6 (six) hours as needed for wheezing or shortness of breath.   benzonatate (TESSALON PERLES) 100 MG capsule Take 1 capsule (100 mg total) by mouth 3 (three) times daily as needed for cough.   Continuous Blood Gluc Sensor (FREESTYLE LIBRE 14 DAY SENSOR) MISC Apply topically every 14 (  fourteen) days. (Patient not taking: Reported on 05/16/2021)   No facility-administered encounter medications on file as of 05/16/2021.    Allergies (verified) Trulicity [dulaglutide] and Penicillins   History: Past Medical History:  Diagnosis Date   Acid reflux 04/12/2012   Arteriosclerosis of coronary artery 04/12/2012   Overview:  1.  Atherosclerotic coronary artery disease.    a.  normal LV function.  Coronary arteriography 5/98 revealed  50% LAD lesion, 95% lesion in large diagonal branch. Left circumflex was normal.  25% proximal RCA lesion.  50% lesion in the PDA.    b.  PTCA of the first diagonal and placement of three multi-link stents.    c.  recent ETT Cardiolite revealed low probability for ischemia.     d.  cardiac catheterization on 05/18/97 per [name] revealed 80% LAD, 70% proximal, 90% mid first AL, 30% RCA.     e.  status post coronary bypass grafting x 2 per [name].    f.  recurrent chest pain with exertion, equivocal ETT.    g.  repeat cardiac catheterization revealed an occluded vein graft.     h.  medical therapy.    i.  ETT Myoview showed borderline changes.     j.  cardiac catheterization 8/02 revealed no significant change in anatomy from previous cath.     k. cardiac cath 06/19/05 revealed patent left internal mammary artery to occluded LAD. Saphenous vein to D1 was chronically occ   Benign essential HTN 11/19/2015   Benign fibroma of prostate 03/05/2014   Cannot sleep 03/05/2014   Colon polyp 04/12/2012   COVID 08/2020   HLD (hyperlipidemia) 11/19/2015   Presence of artificial eye 04/12/2012   Spinal stenosis 04/12/2012   Type 2 diabetes mellitus ([place]) 03/05/2014   Past Surgical History:  Procedure Laterality Date   COLONOSCOPY WITH PROPOFOL N/A 12/13/2017   Procedure: COLONOSCOPY WITH PROPOFOL;  Surgeon: [name], MD;  Location[facility];  Service: Endoscopy;  Laterality: N/A;   CORONARY ARTERY BYPASS GRAFT  1998   CORONARY STENT PLACEMENT  2006   JOINT REPLACEMENT Left    hip- 06/2019   left eye     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)   LEFT HEART CATH AND CORONARY ANGIOGRAPHY Left 06/28/2018   Procedure: LEFT HEART CATH AND CORONARY ANGIOGRAPHY;  Surgeon: [name], MD;  Location: [facility];  Service: Cardiovascular;  Laterality: Left;   lt knee cap     had to have lt knee cap replaced   Family History  Problem Relation Age of Onset   Hypertension Brother     Diabetes Brother    Heart disease Mother    Heart disease Father    Diabetes Daughter    Schizophrenia Maternal Grandmother    Heart disease Brother    Prostate cancer Neg Hx    Bladder Cancer Neg Hx    Kidney cancer Neg Hx    Social History   Socioeconomic History   Marital status: Married    Spouse name: Not on file   Number of children: Not on file   Years of education: Not on file   Highest education level: Not on file  Occupational History   Not on file  Tobacco Use   Smoking status: Former    Types: Cigarettes    Quit date: 1998    Years since quitting: 24.6   Smokeless tobacco: Never  Vaping Use   Vaping Use: Never used  Substance and Sexual Activity   Alcohol use: Yes    Alcohol/week: 6.0 standard drinks    Types: 6 Cans of beer per week    Comment: occasional   Drug use: No   Sexual activity: Yes  Other Topics Concern   Not on file  Social History Narrative   Not on file   Social Determinants of Health   Financial Resource Strain: Low Risk    Difficulty of Paying Living Expenses: Not hard at all  Food Insecurity: No Food Insecurity   Worried About Charity fundraiser in the Last Year: Never true   Carmichaels in the Last Year: Never true  Transportation Needs: No Transportation Needs   Lack of Transportation (Medical): No   Lack of Transportation (Non-Medical): No  Physical Activity: Inactive   Days of Exercise per Week: 0 days   Minutes of Exercise per Session: 0 min  Stress: No Stress Concern Present   Feeling of Stress : Not at all  Social Connections: Not on file    Tobacco Counseling Counseling given: Not Answered   Clinical Intake:  Pre-visit preparation completed: Yes  Pain : No/denies pain     Nutritional Status: BMI of 19-24  Normal Nutritional Risks: None Diabetes: Yes  How often do you need to have someone help you when you read instructions, pamphlets, or other written materials from your doctor or pharmacy?: 1 -  Never What is the last grade level you completed in school?: 12th grade  Diabetic? Yes Nutrition Risk Assessment:  Has the patient had any N/V/D within the last 2 months?  No  Does the patient have any non-healing wounds?  No  Has the patient had any unintentional weight loss or weight gain?  No   Diabetes:  Is the patient diabetic?  Yes  If diabetic, was a CBG obtained today?  No  Did the patient bring in their glucometer from home?  No  How often do you monitor your CBG's? daily.   Financial Strains and Diabetes Management:  Are you having any financial strains with the device, your supplies or your medication? No .  Does the patient want to be seen by Chronic Care Management for management of their diabetes?  No  Would the patient like to be referred to a Nutritionist or for Diabetic Management?  No   Diabetic Exams:  Diabetic Eye Exam: Completed 12/20/2020 Diabetic Foot Exam: Completed 03/14/2021   Interpreter Needed?: No  Information entered by :: NAllen LPN   Activities of Daily Living In your present state of health, do you have any difficulty performing the following activities: 05/16/2021  Hearing? N  Vision? N  Difficulty concentrating or making decisions? N  Walking or climbing stairs? N  Dressing or bathing? N  Doing errands, shopping? N  Preparing Food and eating ? N  Using the Toilet? N  In the past six months, have you accidently leaked urine? N  Do you have problems with loss of bowel control? N  Managing your Medications? N  Managing your Finances? N  Housekeeping or managing your Housekeeping? N  Some recent data might be hidden    Patient Care Team: [name], NP as PCP - General (Nurse Practitioner)  Indicate any recent Medical Services you may have received from other than [facility] providers in the past year (date may be approximate).     Assessment:   This is a routine wellness examination for [facility].  Hearing/Vision screen Vision  Screening - Comments:: Regular eye exams,   Dietary issues and exercise activities discussed: Current Exercise Habits: The patient does not participate in regular exercise at present   Goals Addressed             This Visit's Progress    Patient Stated       05/16/2021, keep sugar level down       Depression Screen PHQ 2/9 Scores 05/16/2021 09/23/2020 05/13/2020 01/05/2020  PHQ - 2 Score 0 0 0 0    Fall Risk Fall Risk  05/16/2021 09/23/2020 09/03/2020 05/13/2020 01/05/2020  Falls in the past year? 0 0 0 0 0  Number falls in past yr: - - 0 - 0  Injury with Fall? - - 0 - 0  Risk for fall due to : Medication side effect - - Medication side effect -  Follow up Falls evaluation completed;Education provided;Falls prevention discussed - - Falls evaluation completed;Education provided;Falls prevention discussed Falls evaluation completed    FALL RISK PREVENTION PERTAINING TO THE HOME:  Any stairs in or around the home? Yes  If so, are there any without handrails? No  Home free of loose throw rugs in walkways, pet beds, electrical cords, etc? Yes  Adequate lighting in your home to reduce risk of falls? Yes   ASSISTIVE DEVICES UTILIZED TO PREVENT FALLS:  Life alert? No  Use of a cane, walker or w/c? No  Grab bars in the bathroom? No  Shower chair or bench in shower? No  Elevated toilet seat or a handicapped toilet? Yes   TIMED UP AND GO:  Was the test performed? No .      Cognitive Function:     6CIT Screen 05/16/2021 05/13/2020  What Year? 0 points 0 points  What month? 0 points 0 points  What time? 0 points 0 points  Count back from 20 0 points 0 points  Months in reverse 0 points 0 points  Repeat phrase 4 points 2 points  Total Score 4 2    Immunizations Immunization History  Administered Date(s) Administered   Fluad Quad(high Dose 65+) 08/14/2019, 06/04/2020   Influenza, High Dose Seasonal PF 05/31/2015, 06/16/2017, 07/12/2018   Influenza-Unspecified 06/20/2014,  06/26/2015, 08/27/2016, 09/11/2016   PFIZER(Purple Top)SARS-COV-2 Vaccination 11/04/2019, 11/26/2019   Pneumococcal Conjugate-13 06/20/2014   Pneumococcal Polysaccharide-23 06/21/2015, 06/25/2015   Td 09/08/2008   Tdap 05/30/2012, 06/16/2018    TDAP status: Up to date  Flu Vaccine status: Due, Education has been provided regarding the importance of this vaccine. Advised may receive this vaccine at local pharmacy or Health Dept. Aware to provide a copy of the vaccination record if obtained from local pharmacy or Health Dept. Verbalized acceptance and understanding.  Pneumococcal vaccine status: Up to date  Covid-19 vaccine status: Completed vaccines  Qualifies for Shingles Vaccine? Yes   Zostavax completed No   Shingrix Completed?: No.    Education has been provided regarding the importance of this vaccine. Patient has been advised to call insurance company to determine out of pocket expense if they have not yet received this vaccine. Advised may also receive vaccine at local pharmacy or Health Dept. Verbalized acceptance and understanding.  Screening Tests Health Maintenance  Topic Date Due   INFLUENZA VACCINE  04/14/2021   HEMOGLOBIN A1C  04/24/2021   COVID-19 Vaccine (3 - Booster for Pfizer series) 06/12/2021 (Originally 04/27/2020)   Zoster Vaccines- Shingrix (1 of 2) 06/14/2021 (Originally 11/20/1998)   OPHTHALMOLOGY EXAM  12/20/2021   FOOT EXAM  03/14/2022  COLONOSCOPY (Pts 45-82yr Insurance coverage will need to be confirmed)  12/14/2027   TETANUS/TDAP  06/16/2028   Hepatitis C Screening  Completed   PNA vac Low Risk Adult  Completed   HPV VACCINES  Aged Out    Health Maintenance  Health Maintenance Due  Topic Date Due   INFLUENZA VACCINE  04/14/2021   HEMOGLOBIN A1C  04/24/2021    Colorectal cancer screening: Type of screening: Colonoscopy. Completed 12/13/2017. Repeat every 10 years  Lung Cancer Screening: (Low Dose CT Chest recommended if Age 72-80years, 30  pack-year currently smoking OR have quit w/in 15years.) does not qualify.   Lung Cancer Screening Referral: no  Additional Screening:  Hepatitis C Screening: does qualify; Completed 11/05/2019  Vision Screening: Recommended annual ophthalmology exams for early detection of glaucoma and other disorders of the eye. Is the patient up to date with their annual eye exam?  Yes  Who is the provider or what is the name of the office in which the patient attends annual eye exams? [facility] If pt is not established with a provider, would they like to be referred to a provider to establish care? No .   Dental Screening: Recommended annual dental exams for proper oral hygiene  Community Resource Referral / Chronic Care Management: CRR required this visit?  No   CCM required this visit?  No      Plan:     I have personally reviewed and noted the following in the patient's chart:   Medical and social history Use of alcohol, tobacco or illicit drugs  Current medications and supplements including opioid prescriptions. Patient is not currently taking opioid prescriptions. Functional ability and status Nutritional status Physical activity Advanced directives List of other physicians Hospitalizations, surgeries, and ER visits in previous 12 months Vitals Screenings to include cognitive, depression, and falls Referrals and appointments  In addition, I have reviewed and discussed with patient certain preventive protocols, quality metrics, and best practice recommendations. A written personalized care plan for preventive services as well as general preventive health recommendations were provided to patient.     NKellie Simmering LPN   [id number]  Nurse Notes:

## 2021-05-26 IMAGING — CR DG CHEST 2V
1 series · 2 of 2 positions shown · non-contrast
Comparison: None.

CLINICAL DATA: Fever and chills with cough and shortness of breath

EXAM:
CHEST - 2 VIEW

[Series 1: w chest pa · 0.14mm/px · 2 of 2 slices shown]
[im 1/2]
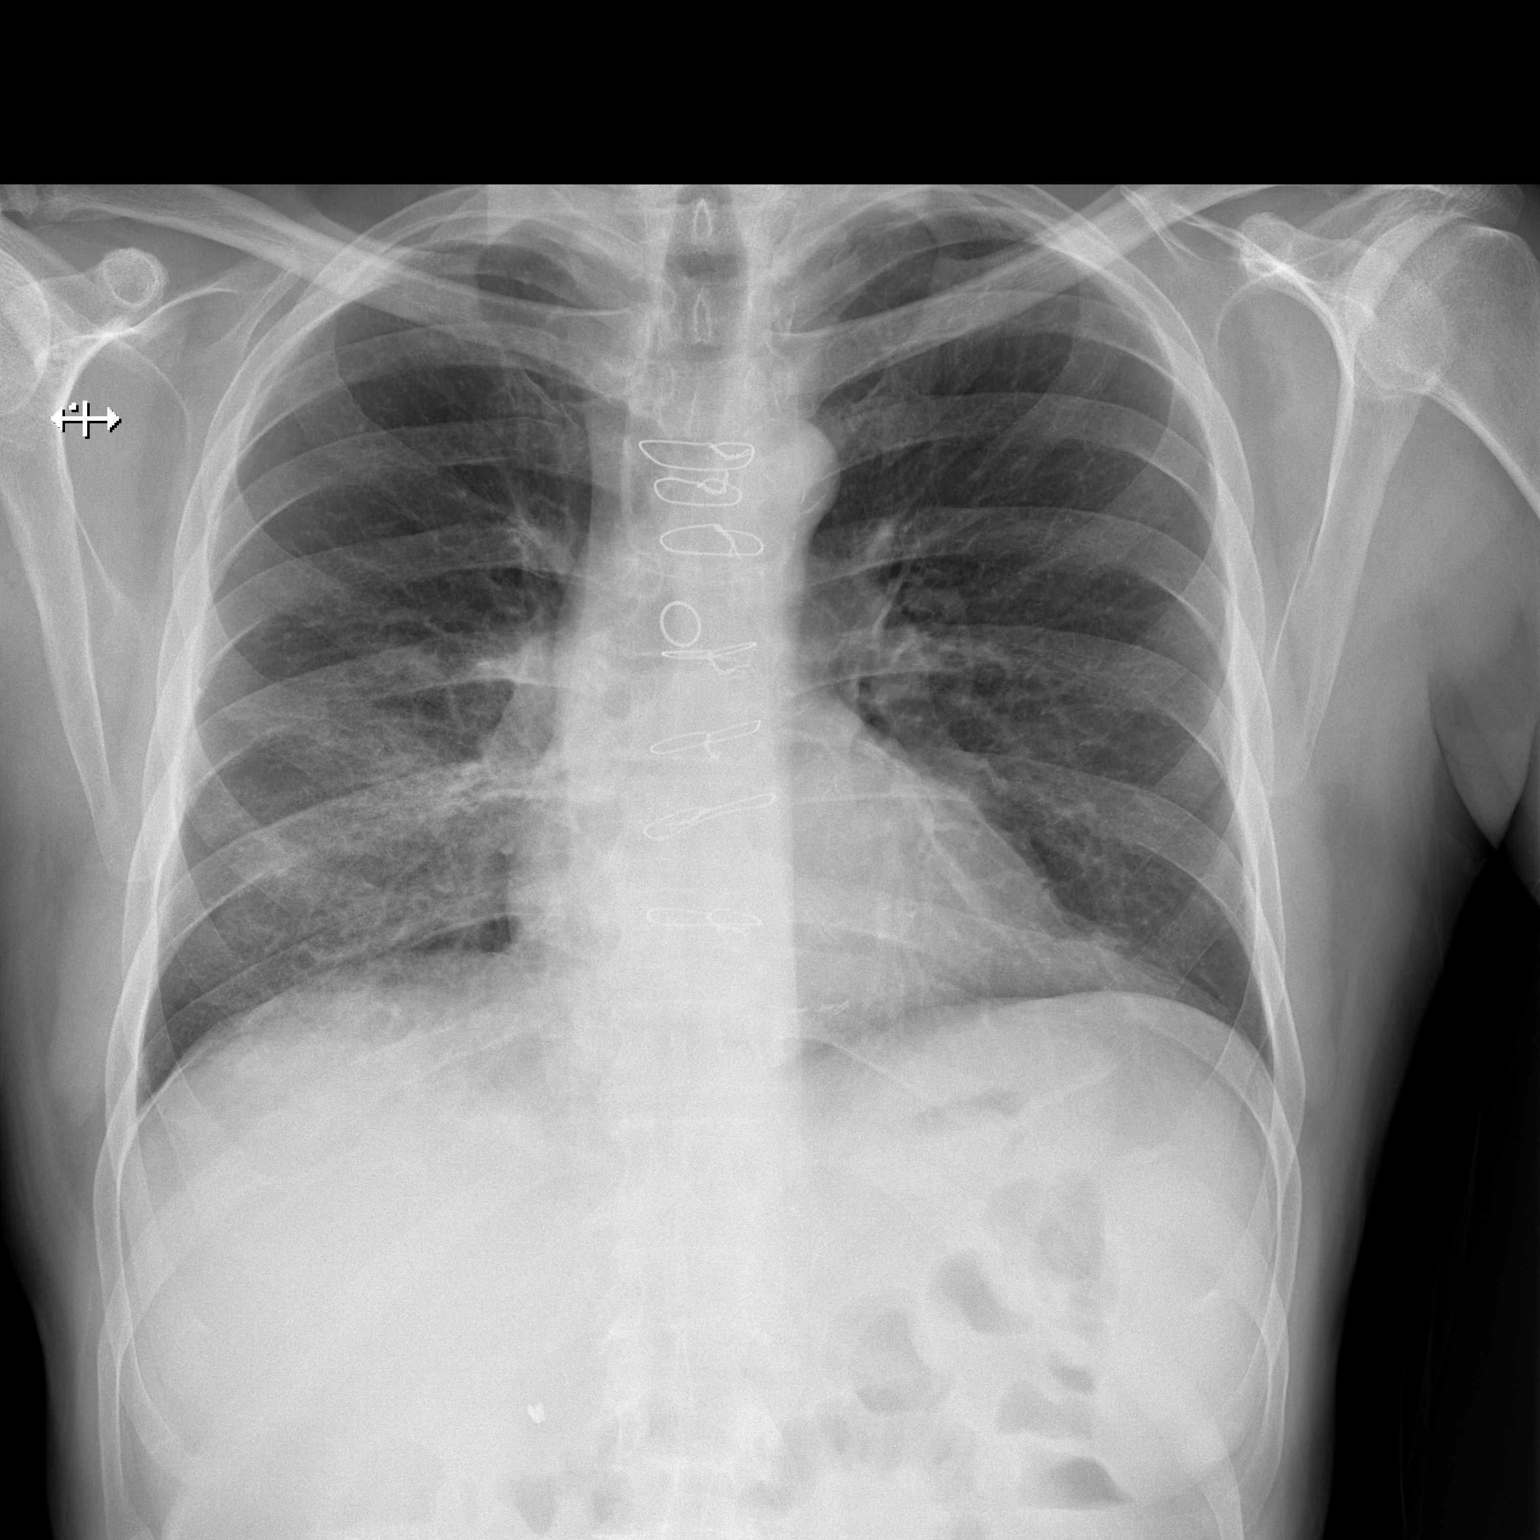
[im 2/2]
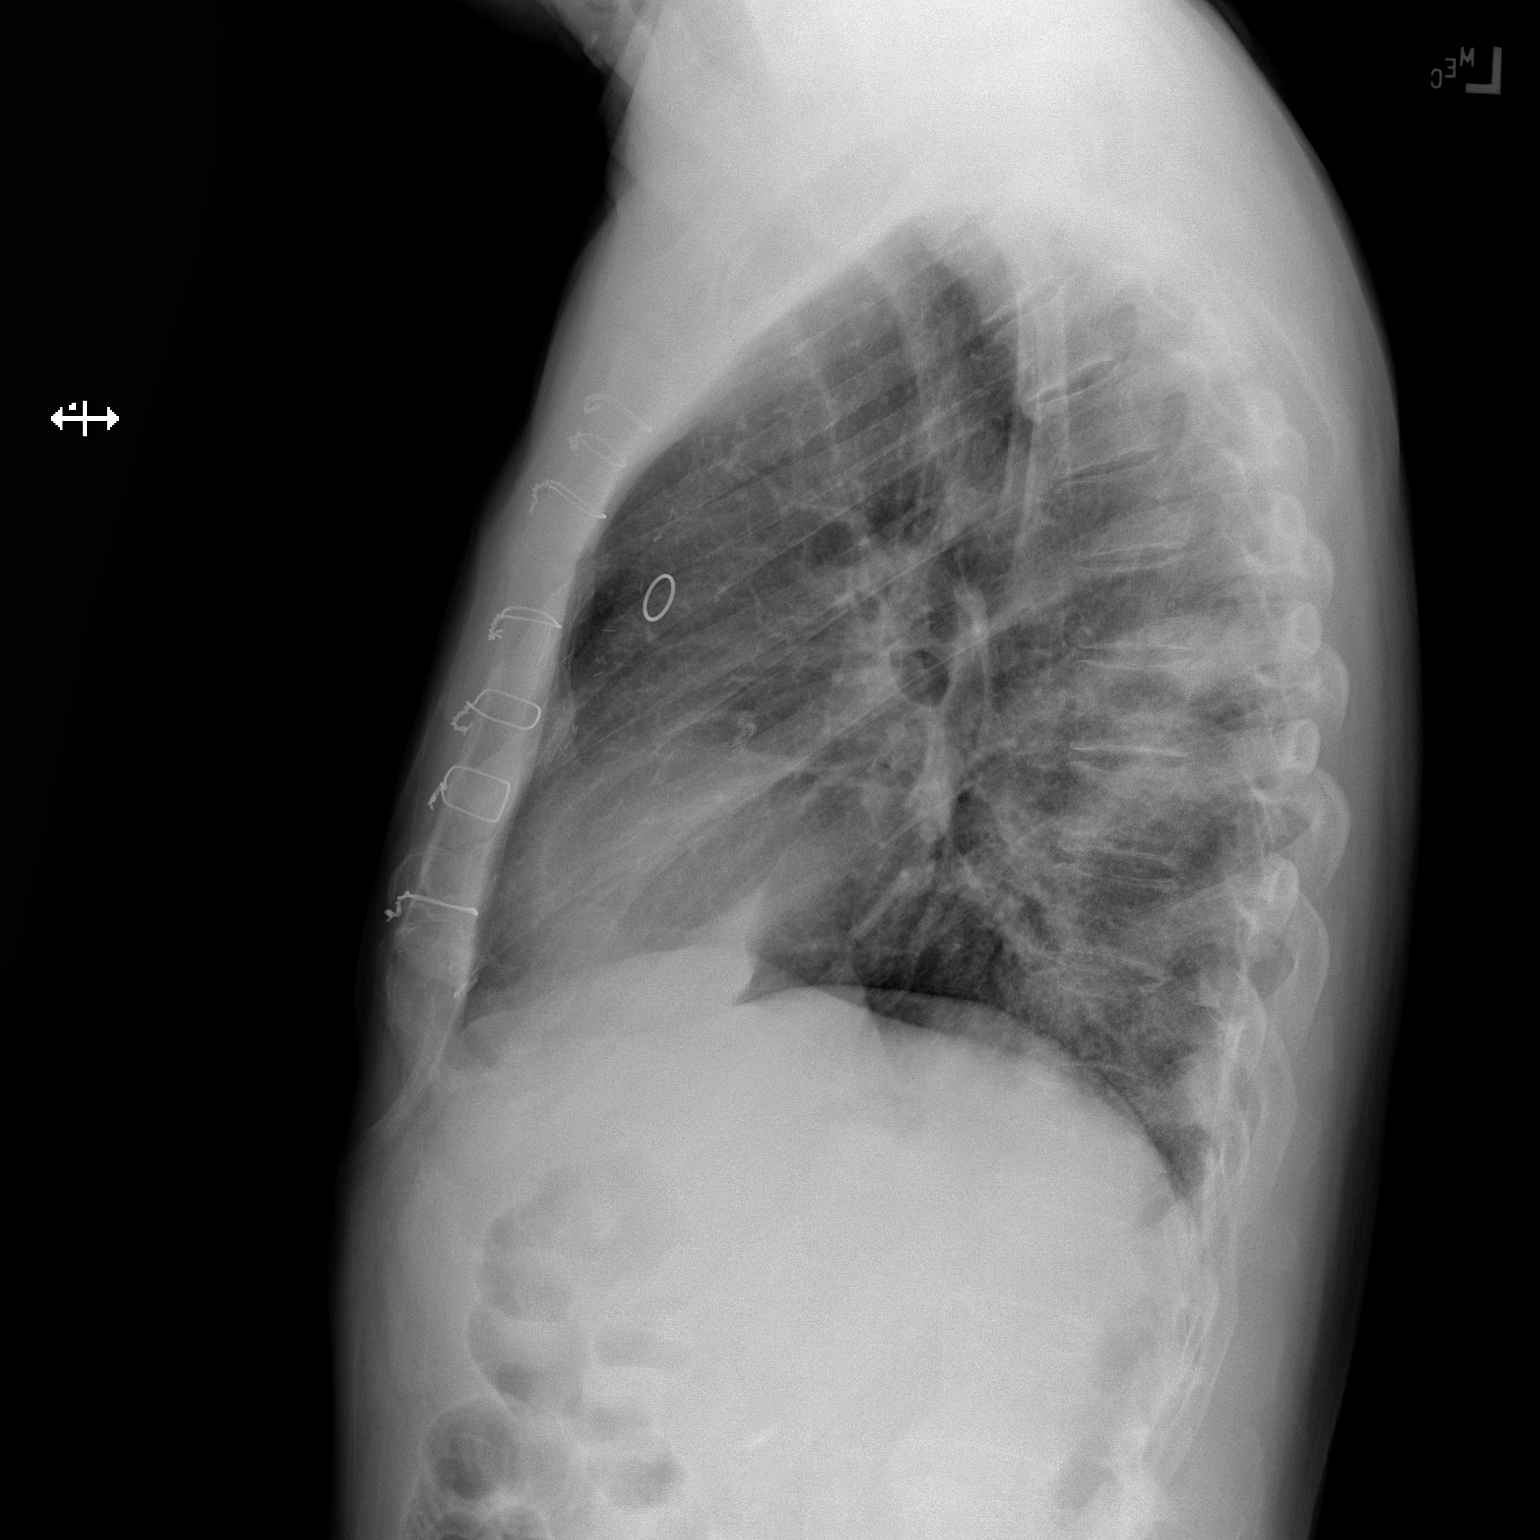

[2 of 2 positions shown; findings below may reference images not displayed]

FINDINGS: There is airspace opacity in the posterior right lower lobe region.
There is mild left base atelectasis. Lungs elsewhere clear. Heart
size and pulmonary vascularity are normal. No adenopathy. Patient is
status post coronary artery bypass grafting. No bone lesions.
IMPRESSION: Airspace opacity consistent with pneumonia in the right lower lobe,
primarily in the superior and posterior segment regions. Slight left
base atelectasis. Lungs elsewhere clear. Cardiac silhouette normal.
Status post coronary artery bypass grafting.

## 2021-06-03 DIAGNOSIS — Z794 Long term (current) use of insulin: Secondary | ICD-10-CM | POA: Diagnosis not present

## 2021-06-03 DIAGNOSIS — I152 Hypertension secondary to endocrine disorders: Secondary | ICD-10-CM | POA: Diagnosis not present

## 2021-06-03 DIAGNOSIS — E785 Hyperlipidemia, unspecified: Secondary | ICD-10-CM | POA: Diagnosis not present

## 2021-06-03 DIAGNOSIS — E1169 Type 2 diabetes mellitus with other specified complication: Secondary | ICD-10-CM | POA: Diagnosis not present

## 2021-06-03 DIAGNOSIS — E1165 Type 2 diabetes mellitus with hyperglycemia: Secondary | ICD-10-CM | POA: Diagnosis not present

## 2021-06-03 DIAGNOSIS — E1159 Type 2 diabetes mellitus with other circulatory complications: Secondary | ICD-10-CM | POA: Diagnosis not present

## 2021-06-20 ENCOUNTER — Ambulatory Visit: Payer: Medicare HMO | Admitting: Nurse Practitioner

## 2021-07-11 ENCOUNTER — Other Ambulatory Visit: Payer: Self-pay

## 2021-07-11 ENCOUNTER — Encounter: Payer: Self-pay | Admitting: Nurse Practitioner

## 2021-07-11 ENCOUNTER — Ambulatory Visit (INDEPENDENT_AMBULATORY_CARE_PROVIDER_SITE_OTHER): Payer: Medicare HMO | Admitting: Nurse Practitioner

## 2021-07-11 VITALS — BP 132/68 | HR 69 | Temp 98.2°F | Wt 178.4 lb

## 2021-07-11 DIAGNOSIS — K219 Gastro-esophageal reflux disease without esophagitis: Secondary | ICD-10-CM | POA: Diagnosis not present

## 2021-07-11 DIAGNOSIS — Z794 Long term (current) use of insulin: Secondary | ICD-10-CM

## 2021-07-11 DIAGNOSIS — I152 Hypertension secondary to endocrine disorders: Secondary | ICD-10-CM | POA: Diagnosis not present

## 2021-07-11 DIAGNOSIS — F5101 Primary insomnia: Secondary | ICD-10-CM

## 2021-07-11 DIAGNOSIS — N400 Enlarged prostate without lower urinary tract symptoms: Secondary | ICD-10-CM | POA: Diagnosis not present

## 2021-07-11 DIAGNOSIS — E781 Pure hyperglyceridemia: Secondary | ICD-10-CM

## 2021-07-11 DIAGNOSIS — E1169 Type 2 diabetes mellitus with other specified complication: Secondary | ICD-10-CM

## 2021-07-11 DIAGNOSIS — E785 Hyperlipidemia, unspecified: Secondary | ICD-10-CM | POA: Diagnosis not present

## 2021-07-11 DIAGNOSIS — N183 Chronic kidney disease, stage 3 unspecified: Secondary | ICD-10-CM | POA: Diagnosis not present

## 2021-07-11 DIAGNOSIS — Z23 Encounter for immunization: Secondary | ICD-10-CM

## 2021-07-11 DIAGNOSIS — E1165 Type 2 diabetes mellitus with hyperglycemia: Secondary | ICD-10-CM

## 2021-07-11 DIAGNOSIS — E1159 Type 2 diabetes mellitus with other circulatory complications: Secondary | ICD-10-CM

## 2021-07-11 DIAGNOSIS — E1122 Type 2 diabetes mellitus with diabetic chronic kidney disease: Secondary | ICD-10-CM | POA: Diagnosis not present

## 2021-07-11 NOTE — Progress Notes (Signed)
BP 132/68 (BP Location: Left Arm)   Pulse 69   Temp 98.2 F (36.8 C) (Oral)   Wt 178 lb 6.4 oz (80.9 kg)   SpO2 97%   BMI 24.20 kg/m    Subjective:    Patient ID: [name], male    DOB: 1950-11-14, 70 y.o.   MRN: [id number]  HPI: [name] is a 70 y.o. male  Chief Complaint  Patient presents with   Diabetes    Patient states he has not had a recent Diabetic Eye Exam. It has been about a year since his last eye exam.    Hyperlipidemia   Hypertension   Gastroesophageal Reflux   DIABETES Followed by endocrinology and last saw Dr. Honor Junes on 06/03/21 with A1C 8.3%.  Was diagnosed > 7 years ago.  Taking Metformin 1000 MG BID, Tresiba 70 units, Glimepiride 4 MG.  Has not taken Jardiance in one month due to cost of $400. Hypoglycemic episodes:no Polydipsia/polyuria: no Visual disturbance: no Chest pain: no Paresthesias: no Glucose Monitoring: yes             Accucheck frequency: Daily             Fasting glucose: 80 to 120             Post prandial:             Evening:             Before meals: Taking Insulin?: yes             Long acting insulin: Tresiba 70 units             Short acting insulin: Blood Pressure Monitoring: not checking Retinal Examination: Up To Date Foot Exam: Up to Date Pneumovax: Up to Date Influenza: Up to Date Aspirin: yes   CHRONIC KIDNEY DISEASE Ongoing on labs noted. CKD status: stable Medications renally dose: yes Previous renal evaluation: no Pneumovax:  Up to Date Influenza Vaccine:  Up to Date    HYPERTENSION / HYPERLIPIDEMIA Followed by cardiology, history of CABG in 1998, stent in 2006, catheterization in 2011.  Taking Crestor for HLD.  Last saw Dr. Ubaldo Glassing 03/18/21, to continue dual anti-platelet therapy. Quit smoking in 1998, smoked about 2 PPD.    Continues on Ramipril 1.25 MG, Atenolol 50 MG, ASA, Fenofibrate (recently started), Lovaza, and Plavix. Last echo in 2019 and EF 55% .  Also has NTG for angina pain, but  no recent use. Satisfied with current treatment? yes Duration of hypertension: chronic BP monitoring frequency: not checking BP range:  BP medication side effects: no Duration of hyperlipidemia: chronic Cholesterol medication side effects: no Cholesterol supplements: none Medication compliance: good compliance Aspirin: yes Recent stressors: no Recurrent headaches: no Visual changes: no Palpitations: no Dyspnea: no Chest pain: no Lower extremity edema: no Dizzy/lightheaded: no    INSOMNIA Continues Amitriptyline 20 MG at bedtime, has taken Ambien in past. Duration: chronic Satisfied with sleep quality: no Difficulty falling asleep: no Difficulty staying asleep: yes Waking a few hours after sleep onset: yes Early morning awakenings: no Daytime hypersomnolence: no Wakes feeling refreshed: yes Good sleep hygiene: yes Apnea: no Snoring: no Depressed/anxious mood: no Recent stress: no Restless legs/nocturnal leg cramps: no Chronic pain/arthritis: no History of sleep study: no Treatments attempted: ambien   GERD Takes Protonix daily.  GERD control status: stable  Satisfied with current treatment? yes Heartburn frequency: none Medication side effects: no  Medication compliance: stable Previous GERD medications: Antacid  use frequency:  none Dysphagia: no Odynophagia:  no Hematemesis: no Blood in stool: no EGD: no  Relevant past medical, surgical, family and social history reviewed and updated as indicated. Interim medical history since our last visit reviewed. Allergies and medications reviewed and updated.  Review of Systems  Constitutional:  Negative for activity change, diaphoresis, fatigue and fever.  Respiratory:  Negative for cough, chest tightness, shortness of breath and wheezing.   Cardiovascular:  Negative for chest pain, palpitations and leg swelling.  Gastrointestinal: Negative.   Endocrine: Negative for polydipsia, polyphagia and polyuria.   Neurological: Negative.   Psychiatric/Behavioral: Negative.     Per HPI unless specifically indicated above     Objective:    BP 132/68 (BP Location: Left Arm)   Pulse 69   Temp 98.2 F (36.8 C) (Oral)   Wt 178 lb 6.4 oz (80.9 kg)   SpO2 97%   BMI 24.20 kg/m   Wt Readings from Last 3 Encounters:  07/11/21 178 lb 6.4 oz (80.9 kg)  05/16/21 180 lb (81.6 kg)  03/14/21 177 lb 6.4 oz (80.5 kg)    Physical Exam Vitals and nursing note reviewed.  Constitutional:      General: He is awake. He is not in acute distress.    Appearance: He is well-developed and well-groomed. He is not ill-appearing.  HENT:     Head: Normocephalic and atraumatic.     Right Ear: Hearing normal. No drainage.     Left Ear: Hearing normal. No drainage.  Eyes:     General: Lids are normal.        Right eye: No discharge.        Left eye: No discharge.     Conjunctiva/sclera: Conjunctivae normal.     Pupils: Pupils are equal, round, and reactive to light.  Neck:     Thyroid: No thyromegaly.     Vascular: No carotid bruit.  Cardiovascular:     Rate and Rhythm: Normal rate and regular rhythm.     Heart sounds: Normal heart sounds, S1 normal and S2 normal. No murmur heard.   No gallop.  Pulmonary:     Effort: Pulmonary effort is normal. No accessory muscle usage or respiratory distress.     Breath sounds: Normal breath sounds.  Abdominal:     General: Bowel sounds are normal.     Palpations: Abdomen is soft.  Musculoskeletal:     Cervical back: Normal range of motion and neck supple.     Right lower leg: No edema.     Left lower leg: No edema.  Lymphadenopathy:     Cervical: No cervical adenopathy.  Skin:    General: Skin is warm and dry.     Capillary Refill: Capillary refill takes less than 2 seconds.  Neurological:     Mental Status: He is alert and oriented to person, place, and time.     Deep Tendon Reflexes: Reflexes are normal and symmetric.     Reflex Scores:      Brachioradialis  reflexes are 2+ on the right side and 2+ on the left side.      Patellar reflexes are 2+ on the right side and 2+ on the left side. Psychiatric:        Attention and Perception: Attention normal.        Mood and Affect: Mood normal.        Speech: Speech normal.        Behavior: Behavior normal. Behavior is cooperative.  Thought Content: Thought content normal.   Results for orders placed or performed in visit on 03/28/21  B12  Result Value Ref Range   Vitamin B-12 682 232 - 1,245 pg/mL  Basic metabolic panel  Result Value Ref Range   Glucose 121 (H) 65 - 99 mg/dL   BUN 18 8 - 27 mg/dL   Creatinine, Ser 1.66 (H) 0.76 - 1.27 mg/dL   eGFR 44 (L) >59 mL/min/1.73   BUN/Creatinine Ratio 11 10 - 24   Sodium 143 134 - 144 mmol/L   Potassium 4.3 3.5 - 5.2 mmol/L   Chloride 109 (H) 96 - 106 mmol/L   CO2 21 20 - 29 mmol/L   Calcium 9.3 8.6 - 10.2 mg/dL      Assessment & Plan:   Problem List Items Addressed This Visit       Cardiovascular and Mediastinum   Hypertension associated with type 2 diabetes mellitus (HCC)    Chronic, ongoing with BP at goal on recheck today.  Goal <130/80.  Will continue current medication regimen and cardiology collaboration.  Recommend he monitor BP at home at least a few mornings a week and document for provider + focus on DASH diet.  Could consider addition of Amlodipine if elevation in SBP noted.  CMP today.  Continue collaboration with cardiology.  Return in 3 months.      Relevant Orders   Comprehensive metabolic panel     Digestive   GERD (gastroesophageal reflux disease)    Chronic, ongoing, stable with daily PPI.  Would benefit from trial reduction in future, discussed with patient.  At this time maintain and monitor.  Mag level today.      Relevant Orders   Magnesium     Endocrine   Hyperlipidemia associated with type 2 diabetes mellitus (HCC)    Chronic, ongoing.  Continue Crestor, tolerating at this time.  Adjust dose as needed.   Lipid panel today.      Relevant Orders   Comprehensive metabolic panel   Lipid Panel w/o Chol/HDL Ratio   Type 2 diabetes mellitus with hyperglycemia, with long-term current use of insulin (HCC) - Primary    Chronic, ongoing.  Followed by endocrinology with recent A1C 8.3%.  Continue current medication regimen as prescribed by endocrinology, notes reviewed.  CCM collaboration continues -- may need help with cost of medications in future.  Urine ALB check 30 in March 2022, continue Ramipril for protection.  Recommend he check BS at least twice a day, fasting and 2 hours after a meal.  Return in 3 months for follow-up.      Relevant Orders   Comprehensive metabolic panel   CKD stage 3 due to type 2 diabetes mellitus ([place])    Ongoing, stable.  Recheck CMP today and monitor.  Continue Ramipril for kidney protection.  Consider nephrology referral if decline ever noted.  Most recent A1c 8.3% with endocrinology, continue this collaboration.      Relevant Orders   Comprehensive metabolic panel     Genitourinary   BPH (benign prostatic hyperplasia)    No current medications, check PSA today.      Relevant Orders   PSA     Other   Insomnia    Chronic, ongoing.  Would benefit from ongoing discontinuation of Ambien due to age >9.  Is tolerating Amitriptyline.  Will continue this regimen at 20 MG.  In future if ongoing issues, could consider Belsomra.  Continue to monitor and adjust regimen as needed.  Recommend  sleep hygiene techniques.      Hypertriglyceridemia    Ongoing issue with statin, Fenofibrate, and Fish Oil on board, will recheck fasting today and continue collaboration with cardiology.      Other Visit Diagnoses     Flu vaccine need       High dose provided today.   Relevant Orders   Flu Vaccine QUAD High Dose(Fluad)        Follow up plan: Return in about 3 months (around 10/11/2021) for T2DM, HTN/HLD, CKD, B12.

## 2021-07-11 NOTE — Assessment & Plan Note (Signed)
No current medications, check PSA today. 

## 2021-07-11 NOTE — Assessment & Plan Note (Signed)
Chronic, ongoing with BP at goal on recheck today.  Goal <130/80.  Will continue current medication regimen and cardiology collaboration.  Recommend he monitor BP at home at least a few mornings a week and document for provider + focus on DASH diet.  Could consider addition of Amlodipine if elevation in SBP noted.  CMP today.  Continue collaboration with cardiology.  Return in 3 months.

## 2021-07-11 NOTE — Assessment & Plan Note (Signed)
Ongoing issue with statin, Fenofibrate, and Fish Oil on board, will recheck fasting today and continue collaboration with cardiology. 

## 2021-07-11 NOTE — Assessment & Plan Note (Addendum)
Chronic, ongoing, stable with daily PPI.  Would benefit from trial reduction in future, discussed with patient.  At this time maintain and monitor.  Mag level today.

## 2021-07-11 NOTE — Assessment & Plan Note (Signed)
Chronic, ongoing.  Continue Crestor, tolerating at this time.  Adjust dose as needed.  Lipid panel today.

## 2021-07-11 NOTE — Assessment & Plan Note (Signed)
Chronic, ongoing.  Would benefit from ongoing discontinuation of Ambien due to age >24.  Is tolerating Amitriptyline.  Will continue this regimen at 20 MG.  In future if ongoing issues, could consider Belsomra.  Continue to monitor and adjust regimen as needed.  Recommend sleep hygiene techniques.

## 2021-07-11 NOTE — Patient Instructions (Signed)
Diabetes Mellitus and Nutrition, Adult When you have diabetes, or diabetes mellitus, it is very important to have healthy eating habits because your blood sugar (glucose) levels are greatly affected by what you eat and drink. Eating healthy foods in the right amounts, at about the same times every day, can help you:  Control your blood glucose.  Lower your risk of heart disease.  Improve your blood pressure.  Reach or maintain a healthy weight. What can affect my meal plan? Every person with diabetes is different, and each person has different needs for a meal plan. Your health care provider may recommend that you work with a dietitian to make a meal plan that is best for you. Your meal plan may vary depending on factors such as:  The calories you need.  The medicines you take.  Your weight.  Your blood glucose, blood pressure, and cholesterol levels.  Your activity level.  Other health conditions you have, such as heart or kidney disease. How do carbohydrates affect me? Carbohydrates, also called carbs, affect your blood glucose level more than any other type of food. Eating carbs naturally raises the amount of glucose in your blood. Carb counting is a method for keeping track of how many carbs you eat. Counting carbs is important to keep your blood glucose at a healthy level, especially if you use insulin or take certain oral diabetes medicines. It is important to know how many carbs you can safely have in each meal. This is different for every person. Your dietitian can help you calculate how many carbs you should have at each meal and for each snack. How does alcohol affect me? Alcohol can cause a sudden decrease in blood glucose (hypoglycemia), especially if you use insulin or take certain oral diabetes medicines. Hypoglycemia can be a life-threatening condition. Symptoms of hypoglycemia, such as sleepiness, dizziness, and confusion, are similar to symptoms of having too much  alcohol.  Do not drink alcohol if: ? Your health care provider tells you not to drink. ? You are pregnant, may be pregnant, or are planning to become pregnant.  If you drink alcohol: ? Do not drink on an empty stomach. ? Limit how much you use to:  0-1 drink a day for women.  0-2 drinks a day for men. ? Be aware of how much alcohol is in your drink. In the [place] one drink equals one 12 oz bottle of beer (355 mL), one 5 oz glass of wine (148 mL), or one 1 oz glass of hard liquor (44 mL). ? Keep yourself hydrated with water, diet soda, or unsweetened iced tea.  Keep in mind that regular soda, juice, and other mixers may contain a lot of sugar and must be counted as carbs. What are tips for following this plan? Reading food labels  Start by checking the serving size on the "Nutrition Facts" label of packaged foods and drinks. The amount of calories, carbs, fats, and other nutrients listed on the label is based on one serving of the item. Many items contain more than one serving per package.  Check the total grams (g) of carbs in one serving. You can calculate the number of servings of carbs in one serving by dividing the total carbs by 15. For example, if a food has 30 g of total carbs per serving, it would be equal to 2 servings of carbs.  Check the number of grams (g) of saturated fats and trans fats in one serving. Choose foods that have   a low amount or none of these fats.  Check the number of milligrams (mg) of salt (sodium) in one serving. Most people should limit total sodium intake to less than 2,300 mg per day.  Always check the nutrition information of foods labeled as "low-fat" or "nonfat." These foods may be higher in added sugar or refined carbs and should be avoided.  Talk to your dietitian to identify your daily goals for nutrients listed on the label. Shopping  Avoid buying canned, pre-made, or processed foods. These foods tend to be high in fat, sodium, and added  sugar.  Shop around the outside edge of the grocery store. This is where you will most often find fresh fruits and vegetables, bulk grains, fresh meats, and fresh dairy. Cooking  Use low-heat cooking methods, such as baking, instead of high-heat cooking methods like deep frying.  Cook using healthy oils, such as olive, canola, or sunflower oil.  Avoid cooking with butter, cream, or high-fat meats. Meal planning  Eat meals and snacks regularly, preferably at the same times every day. Avoid going long periods of time without eating.  Eat foods that are high in fiber, such as fresh fruits, vegetables, beans, and whole grains. Talk with your dietitian about how many servings of carbs you can eat at each meal.  Eat 4-6 oz (112-168 g) of lean protein each day, such as lean meat, chicken, fish, eggs, or tofu. One ounce (oz) of lean protein is equal to: ? 1 oz (28 g) of meat, chicken, or fish. ? 1 egg. ?  cup (62 g) of tofu.  Eat some foods each day that contain healthy fats, such as avocado, nuts, seeds, and fish.   What foods should I eat? Fruits Berries. Apples. Oranges. Peaches. Apricots. Plums. Grapes. Mango. Papaya. Pomegranate. Kiwi. Cherries. Vegetables Lettuce. Spinach. Leafy greens, including kale, chard, collard greens, and mustard greens. Beets. Cauliflower. Cabbage. Broccoli. Carrots. Green beans. Tomatoes. Peppers. Onions. Cucumbers. Brussels sprouts. Grains Whole grains, such as whole-wheat or whole-grain bread, crackers, tortillas, cereal, and pasta. Unsweetened oatmeal. Quinoa. Brown or wild rice. Meats and other proteins Seafood. Poultry without skin. Lean cuts of poultry and beef. Tofu. Nuts. Seeds. Dairy Low-fat or fat-free dairy products such as milk, yogurt, and cheese. The items listed above may not be a complete list of foods and beverages you can eat. Contact a dietitian for more information. What foods should I avoid? Fruits Fruits canned with  syrup. Vegetables Canned vegetables. Frozen vegetables with butter or cream sauce. Grains Refined white flour and flour products such as bread, pasta, snack foods, and cereals. Avoid all processed foods. Meats and other proteins Fatty cuts of meat. Poultry with skin. Breaded or fried meats. Processed meat. Avoid saturated fats. Dairy Full-fat yogurt, cheese, or milk. Beverages Sweetened drinks, such as soda or iced tea. The items listed above may not be a complete list of foods and beverages you should avoid. Contact a dietitian for more information. Questions to ask a health care provider  Do I need to meet with a diabetes educator?  Do I need to meet with a dietitian?  What number can I call if I have questions?  When are the best times to check my blood glucose? Where to find more information:  American Diabetes Association: diabetes.org  Academy of Nutrition and Dietetics: www.eatright.org  National Institute of Diabetes and Digestive and Kidney Diseases: www.niddk.nih.gov  Association of Diabetes Care and Education Specialists: www.diabeteseducator.org Summary  It is important to have healthy eating   habits because your blood sugar (glucose) levels are greatly affected by what you eat and drink.  A healthy meal plan will help you control your blood glucose and maintain a healthy lifestyle.  Your health care provider may recommend that you work with a dietitian to make a meal plan that is best for you.  Keep in mind that carbohydrates (carbs) and alcohol have immediate effects on your blood glucose levels. It is important to count carbs and to use alcohol carefully. This information is not intended to replace advice given to you by your health care provider. Make sure you discuss any questions you have with your health care provider. Document Revised: 08/08/2019 Document Reviewed: 08/08/2019 Elsevier Patient Education  2021 Elsevier Inc.  

## 2021-07-11 NOTE — Assessment & Plan Note (Signed)
Ongoing, stable.  Recheck CMP today and monitor.  Continue Ramipril for kidney protection.  Consider nephrology referral if decline ever noted.  Most recent A1c 8.3% with endocrinology, continue this collaboration.

## 2021-07-11 NOTE — Assessment & Plan Note (Signed)
Chronic, ongoing.  Followed by endocrinology with recent A1C 8.3%.  Continue current medication regimen as prescribed by endocrinology, notes reviewed.  CCM collaboration continues -- may need help with cost of medications in future.  Urine ALB check 30 in March 2022, continue Ramipril for protection.  Recommend he check BS at least twice a day, fasting and 2 hours after a meal.  Return in 3 months for follow-up.

## 2021-07-12 ENCOUNTER — Other Ambulatory Visit: Payer: Self-pay | Admitting: Nurse Practitioner

## 2021-07-12 NOTE — Telephone Encounter (Signed)
last RF 01/24/21 #360 4 RF

## 2021-07-13 MED ORDER — PANTOPRAZOLE SODIUM 20 MG PO TBEC: 20.0000 mg | DELAYED_RELEASE_TABLET | Freq: Every day | ORAL | 4 refills | Status: DC

## 2021-07-13 NOTE — Progress Notes (Signed)
Contacted via [facility] [place], your labs have returned: - Kidney function, creatinine and eGFR, continues to show some mild kidney disease but no worsening.  Some mild improvement this check.  We will continue to monitor this and if ever any decline get you in with a kidney provider.  We may also need to renal dose medications if ever any decline, at this time continue all medications. - Cholesterol levels continue to show some elevation in triglycerides, please ensure you continue your statin and Fenofibrate. - Prostate testing on labs is normal - Magnesium level is a little low, at 1.4, this may be related to you taking Protonix daily.  I recommend you start taking a Magnesium chloride supplement 64 MG from over the counter daily, one tablet daily.  Plus I would like to try reducing your Protonix to 20 MG daily -- if increase heart burn with this let me know.  I will send change in dose in.  Any questions? Keep being awesome!!  Thank you for allowing me to participate in your care.  I appreciate you. Kindest regards, Adalee Kathan

## 2021-07-13 NOTE — Addendum Note (Signed)
Addended by: [name] T on: 07/13/2021 08:48 AM   Modules accepted: Orders

## 2021-07-17 LAB — COMPREHENSIVE METABOLIC PANEL
ALT: 11 IU/L (ref 0–44)
AST: 12 IU/L (ref 0–40)
Albumin/Globulin Ratio: 2.2 (ref 1.2–2.2)
Albumin: 4.2 g/dL (ref 3.7–4.7)
Alkaline Phosphatase: 65 IU/L (ref 44–121)
BUN/Creatinine Ratio: 12 (ref 10–24)
BUN: 15 mg/dL (ref 8–27)
Bilirubin Total: 0.3 mg/dL (ref 0.0–1.2)
CO2: 21 mmol/L (ref 20–29)
Calcium: 8.7 mg/dL (ref 8.6–10.2)
Chloride: 100 mmol/L (ref 96–106)
Creatinine, Ser: 1.28 mg/dL — ABNORMAL HIGH (ref 0.76–1.27)
Globulin, Total: 1.9 g/dL (ref 1.5–4.5)
Glucose: 257 mg/dL — ABNORMAL HIGH (ref 70–99)
Potassium: 4.6 mmol/L (ref 3.5–5.2)
Sodium: 136 mmol/L (ref 134–144)
Total Protein: 6.1 g/dL (ref 6.0–8.5)
eGFR: 59 mL/min/{1.73_m2} — ABNORMAL LOW (ref >59)

## 2021-07-17 LAB — MAGNESIUM: Magnesium: 1.4 mg/dL — ABNORMAL LOW (ref 1.6–2.3)

## 2021-07-17 LAB — LIPID PANEL W/O CHOL/HDL RATIO
Cholesterol, Total: 158 mg/dL (ref 100–199)
HDL: 31 mg/dL — ABNORMAL LOW (ref >39)
LDL Chol Calc (NIH): 77 mg/dL (ref 0–99)
Triglycerides: 311 mg/dL — ABNORMAL HIGH (ref 0–149)
VLDL Cholesterol Cal: 50 mg/dL — ABNORMAL HIGH (ref 5–40)

## 2021-07-17 LAB — PSA: Prostate Specific Ag, Serum: 0.5 ng/mL (ref 0.0–4.0)

## 2021-08-26 ENCOUNTER — Encounter: Payer: Self-pay | Admitting: Nurse Practitioner

## 2021-08-26 ENCOUNTER — Ambulatory Visit: Payer: Self-pay

## 2021-08-26 NOTE — Telephone Encounter (Signed)
Patient called, left VM to return the call to the office to discuss symptoms with a nurse.   Summary: prosthetic eye irration   Patient has a prosthetic eye and its irritated because something went in it. Patient would like PCP to prescribe tobradex because it has worked in the past. PCP has never prescribed before.

## 2021-08-26 NOTE — Telephone Encounter (Signed)
Patient called, left VM to return the call to the office to discuss symptoms with a nurse.  

## 2021-08-26 NOTE — Telephone Encounter (Signed)
Patient called, left VM to return the call to the office to discuss symptoms with a nurse.   Unable to reach patient after 3 attempts by [employer] NT, routing to the provider for resolution per protocol.

## 2021-08-27 ENCOUNTER — Telehealth: Payer: Self-pay

## 2021-08-27 MED ORDER — TOBRADEX 0.3-0.1 % OP OINT: 1.0000 "application " | TOPICAL_OINTMENT | Freq: Three times a day (TID) | OPHTHALMIC | 2 refills | Status: DC

## 2021-08-27 NOTE — Telephone Encounter (Signed)
Patient was notified and verbalized understanding and has no further questions at this time.

## 2021-08-27 NOTE — Addendum Note (Signed)
Addended by: [name] T on: 08/27/2021 08:46 AM   Modules accepted: Orders

## 2021-08-27 NOTE — Telephone Encounter (Signed)
Received a fax that patient's insurance does not cover Torbradex Ophth Oint, but they covered the alternative of Neomycinpolymyxindexameth or Tobramycindexamethasone or Sulfacetamideprenisolone. Please advise?

## 2021-10-02 DIAGNOSIS — E119 Type 2 diabetes mellitus without complications: Secondary | ICD-10-CM | POA: Diagnosis not present

## 2021-10-02 DIAGNOSIS — E1159 Type 2 diabetes mellitus with other circulatory complications: Secondary | ICD-10-CM | POA: Diagnosis not present

## 2021-10-02 DIAGNOSIS — I152 Hypertension secondary to endocrine disorders: Secondary | ICD-10-CM | POA: Diagnosis not present

## 2021-10-02 DIAGNOSIS — E785 Hyperlipidemia, unspecified: Secondary | ICD-10-CM | POA: Diagnosis not present

## 2021-10-02 DIAGNOSIS — E1169 Type 2 diabetes mellitus with other specified complication: Secondary | ICD-10-CM | POA: Diagnosis not present

## 2021-10-10 ENCOUNTER — Ambulatory Visit (INDEPENDENT_AMBULATORY_CARE_PROVIDER_SITE_OTHER): Payer: Medicare HMO | Admitting: Nurse Practitioner

## 2021-10-10 ENCOUNTER — Other Ambulatory Visit: Payer: Self-pay

## 2021-10-10 ENCOUNTER — Encounter: Payer: Self-pay | Admitting: Nurse Practitioner

## 2021-10-10 VITALS — BP 126/82 | HR 57 | Temp 98.2°F | Ht 72.0 in | Wt 181.0 lb

## 2021-10-10 DIAGNOSIS — E1169 Type 2 diabetes mellitus with other specified complication: Secondary | ICD-10-CM

## 2021-10-10 DIAGNOSIS — E538 Deficiency of other specified B group vitamins: Secondary | ICD-10-CM | POA: Diagnosis not present

## 2021-10-10 DIAGNOSIS — F5101 Primary insomnia: Secondary | ICD-10-CM

## 2021-10-10 DIAGNOSIS — E1165 Type 2 diabetes mellitus with hyperglycemia: Secondary | ICD-10-CM | POA: Diagnosis not present

## 2021-10-10 DIAGNOSIS — I2510 Atherosclerotic heart disease of native coronary artery without angina pectoris: Secondary | ICD-10-CM

## 2021-10-10 DIAGNOSIS — E1129 Type 2 diabetes mellitus with other diabetic kidney complication: Secondary | ICD-10-CM | POA: Diagnosis not present

## 2021-10-10 DIAGNOSIS — I152 Hypertension secondary to endocrine disorders: Secondary | ICD-10-CM | POA: Diagnosis not present

## 2021-10-10 DIAGNOSIS — E785 Hyperlipidemia, unspecified: Secondary | ICD-10-CM

## 2021-10-10 DIAGNOSIS — Z970 Presence of artificial eye: Secondary | ICD-10-CM

## 2021-10-10 DIAGNOSIS — K219 Gastro-esophageal reflux disease without esophagitis: Secondary | ICD-10-CM | POA: Diagnosis not present

## 2021-10-10 DIAGNOSIS — Z794 Long term (current) use of insulin: Secondary | ICD-10-CM | POA: Diagnosis not present

## 2021-10-10 DIAGNOSIS — E1159 Type 2 diabetes mellitus with other circulatory complications: Secondary | ICD-10-CM

## 2021-10-10 DIAGNOSIS — R809 Proteinuria, unspecified: Secondary | ICD-10-CM

## 2021-10-10 DIAGNOSIS — E1122 Type 2 diabetes mellitus with diabetic chronic kidney disease: Secondary | ICD-10-CM | POA: Diagnosis not present

## 2021-10-10 DIAGNOSIS — N183 Chronic kidney disease, stage 3 unspecified: Secondary | ICD-10-CM | POA: Diagnosis not present

## 2021-10-10 DIAGNOSIS — E781 Pure hyperglyceridemia: Secondary | ICD-10-CM

## 2021-10-10 LAB — MICROALBUMIN, URINE WAIVED
Creatinine, Urine Waived: 200 mg/dL (ref 10–300)
Microalb, Ur Waived: 150 mg/L — ABNORMAL HIGH (ref 0–19)

## 2021-10-10 MED ORDER — NEOMYCIN-POLYMYXIN-DEXAMETH 0.1 % OP SUSP: 1.0000 [drp] | Freq: Four times a day (QID) | OPHTHALMIC | 12 refills | Status: DC

## 2021-10-10 NOTE — Assessment & Plan Note (Signed)
Chronic, ongoing.  Followed by endocrinology with recent A1C 9.6% and urine ALB on check today 150 -- continue Ramipril for kidney protection.  Continue current medication regimen as prescribed by endocrinology, notes reviewed.  CCM collaboration continues -- may need help with cost of medications in future. Recommend he check BS at least twice a day, fasting and 2 hours after a meal.  Return in 6 months for follow-up.

## 2021-10-10 NOTE — Assessment & Plan Note (Signed)
Ongoing, stable.  Recheck CMP and urine ALB today - 150 on check (January 2023).  Continue Ramipril for kidney protection.  Consider nephrology referral if decline ever noted.  Most recent A1c 9.6% with endocrinology, continue this collaboration.

## 2021-10-10 NOTE — Patient Instructions (Signed)
Bacterial Conjunctivitis, Adult Bacterial conjunctivitis is an infection of your conjunctiva. This is the clear membrane that covers the white part of your eye and the inner part of your eyelid. This infection can make your eye: Red or pink. Itchy or irritated. This condition spreads easily from person to person (is contagious) and from one eye to the other eye. What are the causes? This condition is caused by germs (bacteria). You may get the infection if you come into close contact with: A person who has the infection. Items that have germs on them (are contaminated), such as face towels, contact lens solution, or eye makeup. What increases the risk? You are more likely to get this condition if: You have contact with people who have the infection. You wear contact lenses. You have a sinus infection. You have had a recent eye injury or surgery. You have a weak body defense system (immune system). You have dry eyes. What are the signs or symptoms?  Thick, yellowish discharge from the eye. Tearing or watery eyes. Itchy eyes. Burning feeling in your eyes. Eye redness. Swollen eyelids. Blurred vision. How is this treated?  Antibiotic eye drops or ointment. Antibiotic medicine taken by mouth. This is used for infections that do not get better with drops or ointment or that last more than 10 days. Cool, wet cloths placed on the eyes. Artificial tears used 2-6 times a day. Follow these instructions at home: Medicines Take or apply your antibiotic medicine as told by your doctor. Do not stop using it even if you start to feel better. Take or apply over-the-counter and prescription medicines only as told by your doctor. Do not touch your eyelid with the eye-drop bottle or the ointment tube. Managing discomfort Wipe any fluid from your eye with a warm, wet washcloth or a cotton ball. Place a clean, cool, wet cloth on your eye. Do this for 10-20 minutes, 3-4 times a day. General  instructions Do not wear contacts until the infection is gone. Wear glasses until your doctor says it is okay to wear contacts again. Do not wear eye makeup until the infection is gone. Throw away old eye makeup. Change or wash your pillowcase every day. Do not share towels or washcloths. Wash your hands often with soap and water for at least 20 seconds and especially before touching your face or eyes. Use paper towels to dry your hands. Do not touch or rub your eyes. Do not drive or use heavy machinery if your vision is blurred. Contact a doctor if: You have a fever. You do not get better after 10 days. Get help right away if: You have a fever and your symptoms get worse all of a sudden. You have very bad pain when you move your eye. Your face: Hurts. Is red. Is swollen. You have sudden loss of vision. Summary Bacterial conjunctivitis is an infection of your conjunctiva. This infection spreads easily from person to person. Wash your hands often with soap and water for at least 20 seconds and especially before touching your face or eyes. Use paper towels to dry your hands. Take or apply your antibiotic medicine as told by your doctor. Contact a doctor if you have a fever or you do not get better after 10 days. This information is not intended to replace advice given to you by your health care provider. Make sure you discuss any questions you have with your health care provider. Document Revised: 12/11/2020 Document Reviewed: 12/11/2020 Elsevier Patient Education    2022 [employer]  

## 2021-10-10 NOTE — Assessment & Plan Note (Signed)
History of lows and taking supplement.  Recheck today and adjust as needed.

## 2021-10-10 NOTE — Assessment & Plan Note (Signed)
Will send in eye drops to assist with irritation, he is to alert if not covered.  Recommend he schedule ASAP with eye doctor.

## 2021-10-10 NOTE — Assessment & Plan Note (Signed)
Chronic, ongoing, stable with daily PPI.  Would benefit from trial reduction in future, discussed with patient.  At this time maintain and monitor.  Mag level today.

## 2021-10-10 NOTE — Assessment & Plan Note (Signed)
Chronic, stable on recent recheck.  Continue daily supplement and recheck level today. 

## 2021-10-10 NOTE — Assessment & Plan Note (Signed)
Chronic, stable with no recent CP.  Continue current medication regimen and collaboration with cardiology. 

## 2021-10-10 NOTE — Assessment & Plan Note (Addendum)
Chronic, ongoing.  Continue Crestor, Lovaza, and Fenofibrate, tolerating at this time.  Adjust dose as needed.  Lipid panel today. 

## 2021-10-10 NOTE — Progress Notes (Signed)
BP 126/82 (BP Location: Left Arm)    Pulse (!) 57    Temp 98.2 F (36.8 C) (Oral)    Ht 6' (1.829 m)    Wt 181 lb (82.1 kg)    SpO2 97%    BMI 24.55 kg/m    Subjective:    Patient ID: [name], male    DOB: [birth year], 71 y.o.   MRN: [id number]  HPI: [name] is a 71 y.o. male  Chief Complaint  Patient presents with   Diabetes   Hyperlipidemia   Hypertension   Chronic Kidney Disease   Vitamin B12   Eye Problem    Patient states would like to discuss with provider about his artificial eye being irritating and he has not been able to follow up with his eye doctor due to his wife being recently diagnosed with cancer and taking care of her. Patient states his symptoms started about a month ago and he has not ever had it this bad before. Patient states it stings, and gets extremely dry and he has to continuous blink to keep moist.    DIABETES Followed by endocrinology and last saw Dr. Honor Junes on 09/22/21 with A1c 9.6% He had been out of insulin for 4 weeks due to pharmacies being out of it) -- they switched back to [place] and are working on assistance for this.  Was diagnosed > 8 years ago.    Taking Metformin 1000 MG BID, Tresiba 70 units, Glimepiride 4 MG.  Tried Jardiance in past, but was too costly.  History of low B12 levels, is taking supplement and recent level stable.  Is having some irritation artificial eye, unable to get to eye doctor due to his wife recently being diagnosed with cancer.  Ordered Tobradex for patient, but he did not pick up due to out of pocket cost.   Hypoglycemic episodes:no Polydipsia/polyuria: no Visual disturbance: no Chest pain: no Paresthesias: no Glucose Monitoring: yes             Accucheck frequency: Daily             Fasting glucose: 180 to 225 -- was out of insulin for 4 weeks             Post prandial:             Evening:             Before meals: Taking Insulin?: yes             Long acting insulin: Tresiba 70 units              Short acting insulin: Blood Pressure Monitoring: not checking Retinal Examination: Up To Date Foot Exam: Up to Date Pneumovax: Up to Date Influenza: Up to Date Aspirin: yes   GERD Takes Protonix daily.  GERD control status: stable  Satisfied with current treatment? yes Heartburn frequency: none Medication side effects: no  Medication compliance: stable Previous GERD medications: Antacid use frequency:  none Dysphagia: no Odynophagia:  no Hematemesis: no Blood in stool: no EGD: yes  CHRONIC KIDNEY DISEASE Ongoing on labs noted with recent eGFR 59. CKD status: stable Medications renally dose: yes Previous renal evaluation: no Pneumovax:  Up to Date Influenza Vaccine:  Up to Date    HYPERTENSION / HYPERLIPIDEMIA Followed by cardiology.  History of CABG in 1998, stent in 2006, catheterization in 2011.  Taking Crestor for HLD.  Last saw Dr. Ubaldo Glassing 03/18/21, to continue dual anti-platelet therapy. Quit  smoking in 1998, smoked about 2 PPD.    Continues on Ramipril 1.25 MG, Atenolol 50 MG, ASA, Fenofibrate, Lovaza, and Plavix.  Last echo in 2019 and EF 55% .  Has NTG for angina pain, but no recent use. Satisfied with current treatment? yes Duration of hypertension: chronic BP monitoring frequency: not checking BP range:  BP medication side effects: no Duration of hyperlipidemia: chronic Cholesterol medication side effects: no Cholesterol supplements: none Medication compliance: good compliance Aspirin: yes Recent stressors: no Recurrent headaches: no Visual changes: no Palpitations: no Dyspnea: no Chest pain: no Lower extremity edema: no Dizzy/lightheaded: no    INSOMNIA Continues Amitriptyline 20 MG at bedtime, has taken Ambien in past. Duration: chronic Satisfied with sleep quality: no Difficulty falling asleep: no Difficulty staying asleep: yes Waking a few hours after sleep onset: yes Early morning awakenings: no Daytime hypersomnolence: no Wakes  feeling refreshed: yes Good sleep hygiene: yes Apnea: no Snoring: no Depressed/anxious mood: no Recent stress: no Restless legs/nocturnal leg cramps: no Chronic pain/arthritis: no History of sleep study: yes in past, was negative Treatments attempted: [place]   Relevant past medical, surgical, family and social history reviewed and updated as indicated. Interim medical history since our last visit reviewed. Allergies and medications reviewed and updated.  Review of Systems  Constitutional:  Negative for activity change, diaphoresis, fatigue and fever.  Respiratory:  Negative for cough, chest tightness, shortness of breath and wheezing.   Cardiovascular:  Negative for chest pain, palpitations and leg swelling.  Gastrointestinal: Negative.   Endocrine: Negative for polydipsia, polyphagia and polyuria.  Neurological: Negative.   Psychiatric/Behavioral: Negative.     Per HPI unless specifically indicated above     Objective:    BP 126/82 (BP Location: Left Arm)    Pulse (!) 57    Temp 98.2 F (36.8 C) (Oral)    Ht 6' (1.829 m)    Wt 181 lb (82.1 kg)    SpO2 97%    BMI 24.55 kg/m   Wt Readings from Last 3 Encounters:  10/10/21 181 lb (82.1 kg)  07/11/21 178 lb 6.4 oz (80.9 kg)  05/16/21 180 lb (81.6 kg)    Physical Exam Vitals and nursing note reviewed.  Constitutional:      General: He is awake. He is not in acute distress.    Appearance: He is well-developed and well-groomed. He is not ill-appearing.  HENT:     Head: Normocephalic and atraumatic.     Right Ear: Hearing normal. No drainage.     Left Ear: Hearing normal. No drainage.  Eyes:     General: Lids are normal. Lids are everted, no foreign bodies appreciated.        Right eye: No foreign body or discharge.        Left eye: No foreign body or discharge.     Conjunctiva/sclera: Conjunctivae normal.     Pupils: Pupils are equal, round, and reactive to light.     Comments: Artificial eye left side with mild erythema  to exterior lid, no discharge.  Neck:     Thyroid: No thyromegaly.     Vascular: No carotid bruit.  Cardiovascular:     Rate and Rhythm: Normal rate and regular rhythm.     Heart sounds: Normal heart sounds, S1 normal and S2 normal. No murmur heard.   No gallop.  Pulmonary:     Effort: Pulmonary effort is normal. No accessory muscle usage or respiratory distress.     Breath sounds: Normal breath  sounds.  Abdominal:     General: Bowel sounds are normal.     Palpations: Abdomen is soft.  Musculoskeletal:     Cervical back: Normal range of motion and neck supple.     Right lower leg: No edema.     Left lower leg: No edema.  Lymphadenopathy:     Cervical: No cervical adenopathy.  Skin:    General: Skin is warm and dry.     Capillary Refill: Capillary refill takes less than 2 seconds.  Neurological:     Mental Status: He is alert and oriented to person, place, and time.     Deep Tendon Reflexes: Reflexes are normal and symmetric.     Reflex Scores:      Brachioradialis reflexes are 2+ on the right side and 2+ on the left side.      Patellar reflexes are 2+ on the right side and 2+ on the left side. Psychiatric:        Attention and Perception: Attention normal.        Mood and Affect: Mood normal.        Speech: Speech normal.        Behavior: Behavior normal. Behavior is cooperative.        Thought Content: Thought content normal.   Results for orders placed or performed in visit on 10/10/21  Microalbumin, Urine Waived  Result Value Ref Range   Microalb, Ur Waived 150 (H) 0 - 19 mg/L   Creatinine, Urine Waived 200 10 - 300 mg/dL   Microalb/Creat Ratio 30-300 (H) <30 mg/g      Assessment & Plan:   Problem List Items Addressed This Visit       Cardiovascular and Mediastinum   ASHD (arteriosclerotic heart disease)    Chronic, stable with no recent CP.  Continue current medication regimen and collaboration with cardiology.      Hypertension associated with type 2  diabetes mellitus (HCC)    Chronic, ongoing with BP at goal on recheck today.  Goal <130/80.  Will continue current medication regimen and cardiology collaboration.  Recommend he monitor BP at home at least a few mornings a week and document for provider + focus on DASH diet.  Could consider addition of Amlodipine if elevation in SBP noted.  CMP today, urine ALB 150 (January 2023), continue Ramipril for kidney protection.  Continue collaboration with cardiology.  Return in 6 months.      Relevant Orders   Microalbumin, Urine Waived (Completed)   Comprehensive metabolic panel     Digestive   GERD (gastroesophageal reflux disease)    Chronic, ongoing, stable with daily PPI.  Would benefit from trial reduction in future, discussed with patient.  At this time maintain and monitor.  Mag level today.        Endocrine   CKD stage 3 due to type 2 diabetes mellitus (HCC)    Ongoing, stable.  Recheck CMP and urine ALB today - 150 on check (January 2023).  Continue Ramipril for kidney protection.  Consider nephrology referral if decline ever noted.  Most recent A1c 9.6% with endocrinology, continue this collaboration.      Relevant Orders   Microalbumin, Urine Waived (Completed)   Comprehensive metabolic panel   Hyperlipidemia associated with type 2 diabetes mellitus (HCC)    Chronic, ongoing.  Continue Crestor, Lovaza, and Fenofibrate, tolerating at this time.  Adjust dose as needed.  Lipid panel today.      Relevant Orders   Comprehensive metabolic  panel   Lipid Panel w/o Chol/HDL Ratio   Type 2 diabetes mellitus with hyperglycemia, with long-term current use of insulin (HCC) - Primary    Chronic, ongoing.  Followed by endocrinology with recent A1C 9.6% and urine ALB on check today 150 -- continue Ramipril for kidney protection.  Continue current medication regimen as prescribed by endocrinology, notes reviewed.  CCM collaboration continues -- may need help with cost of medications in future.  Recommend he check BS at least twice a day, fasting and 2 hours after a meal.  Return in 6 months for follow-up.      Relevant Orders   Microalbumin, Urine Waived (Completed)   Type 2 diabetes mellitus with proteinuria (HCC)    Chronic, ongoing.  Followed by endocrinology with recent A1C 9.6% and urine ALB on check today 150 -- continue Ramipril for kidney protection.  Continue current medication regimen as prescribed by endocrinology, notes reviewed.  CCM collaboration continues -- may need help with cost of medications in future. Recommend he check BS at least twice a day, fasting and 2 hours after a meal.  Return in 6 months for follow-up.        Other   Eye globe prosthesis    Will send in eye drops to assist with irritation, he is to alert if not covered.  Recommend he schedule ASAP with eye doctor.      Hypertriglyceridemia    Ongoing issue with statin, Fenofibrate, and Fish Oil on board, will recheck fasting today and continue collaboration with cardiology.      Relevant Orders   Comprehensive metabolic panel   Lipid Panel w/o Chol/HDL Ratio   Hypomagnesemia    History of lows and taking supplement.  Recheck today and adjust as needed.      Relevant Orders   Magnesium   Insomnia    Chronic, ongoing.  Would benefit from ongoing discontinuation of Ambien due to age >10.  Is tolerating Amitriptyline.  Will continue this regimen at 20 MG.  In future if ongoing issues, could consider Belsomra.  Continue to monitor and adjust regimen as needed.  Recommend sleep hygiene techniques.      Vitamin B12 deficiency    Chronic, stable on recent recheck.  Continue daily supplement and recheck level today.      Relevant Orders   Vitamin B12     Follow up plan: Return in about 6 months (around 04/09/2022) for T2DM, HTN/HLD, CKD.

## 2021-10-10 NOTE — Assessment & Plan Note (Signed)
Chronic, ongoing with BP at goal on recheck today.  Goal <130/80.  Will continue current medication regimen and cardiology collaboration.  Recommend he monitor BP at home at least a few mornings a week and document for provider + focus on DASH diet.  Could consider addition of Amlodipine if elevation in SBP noted.  CMP today, urine ALB 150 (January 2023), continue Ramipril for kidney protection.  Continue collaboration with cardiology.  Return in 6 months.

## 2021-10-10 NOTE — Assessment & Plan Note (Signed)
Ongoing issue with statin, Fenofibrate, and Fish Oil on board, will recheck fasting today and continue collaboration with cardiology. 

## 2021-10-10 NOTE — Assessment & Plan Note (Signed)
Chronic, ongoing.  Would benefit from ongoing discontinuation of Ambien due to age >64.  Is tolerating Amitriptyline.  Will continue this regimen at 20 MG.  In future if ongoing issues, could consider Belsomra.  Continue to monitor and adjust regimen as needed.  Recommend sleep hygiene techniques.

## 2021-10-11 LAB — COMPREHENSIVE METABOLIC PANEL
ALT: 14 IU/L (ref 0–44)
AST: 13 IU/L (ref 0–40)
Albumin/Globulin Ratio: 2.3 — ABNORMAL HIGH (ref 1.2–2.2)
Albumin: 4.5 g/dL (ref 3.7–4.7)
Alkaline Phosphatase: 64 IU/L (ref 44–121)
BUN/Creatinine Ratio: 11 (ref 10–24)
BUN: 14 mg/dL (ref 8–27)
Bilirubin Total: 0.3 mg/dL (ref 0.0–1.2)
CO2: 21 mmol/L (ref 20–29)
Calcium: 9.3 mg/dL (ref 8.6–10.2)
Chloride: 103 mmol/L (ref 96–106)
Creatinine, Ser: 1.24 mg/dL (ref 0.76–1.27)
Globulin, Total: 2 g/dL (ref 1.5–4.5)
Glucose: 223 mg/dL — ABNORMAL HIGH (ref 70–99)
Potassium: 4.7 mmol/L (ref 3.5–5.2)
Sodium: 134 mmol/L (ref 134–144)
Total Protein: 6.5 g/dL (ref 6.0–8.5)
eGFR: 62 mL/min/{1.73_m2} (ref >59)

## 2021-10-11 LAB — LIPID PANEL W/O CHOL/HDL RATIO
Cholesterol, Total: 178 mg/dL (ref 100–199)
HDL: 33 mg/dL — ABNORMAL LOW (ref >39)
LDL Chol Calc (NIH): 72 mg/dL (ref 0–99)
Triglycerides: 467 mg/dL — ABNORMAL HIGH (ref 0–149)
VLDL Cholesterol Cal: 73 mg/dL — ABNORMAL HIGH (ref 5–40)

## 2021-10-11 LAB — MAGNESIUM: Magnesium: 1.4 mg/dL — ABNORMAL LOW (ref 1.6–2.3)

## 2021-10-11 LAB — VITAMIN B12: Vitamin B-12: 957 pg/mL (ref 232–1245)

## 2021-10-11 NOTE — Progress Notes (Signed)
Contacted via [place] morning [name], your labs have returned: - Kidney function, creatinine and eGFR, remains normal, as is liver function, AST and ALT.   - Cholesterol labs continue to show stable LDL, but triglycerides remain elevated, continue all current medications and we will monitor. - B12 level is normal, continue daily supplement. - Magnesium level remains on low side a little -- this can be related to Protonix which you take for heart burn daily.  I recommend taking Magnesium supplement 400 MG twice a day, we will recheck this next visit.  Any questions? Keep being amazing!!  Thank you for allowing me to participate in your care.  I appreciate you. Kindest regards, Jacolby Risby

## 2021-12-02 DIAGNOSIS — M65312 Trigger thumb, left thumb: Secondary | ICD-10-CM | POA: Diagnosis not present

## 2021-12-04 ENCOUNTER — Telehealth: Payer: Self-pay

## 2021-12-04 NOTE — Chronic Care Management (AMB) (Signed)
? ? ?  Chronic Care Management ?Pharmacy Assistant  ? ?Name: Josh Nicolosi  MRN: [id number] DOB: [birth year] ? ?Reason for Encounter: Disease State General ? ?Recent office visits:  ?10/10/21-[name], NP (PCP) Seen for general follow up visit. Labs ordered. Start on MAXITROL 0.1 % ophthalmic suspension 1 drop left eye 4x daily. Follow up in 6 months. ?07/11/21-[name], NP (PCP) General follow up visit. Labs ordered. Flu vaccine given in office. Follow up in 3 months. ? ?Recent consult visits:  ?10/02/21-[name] (Endocrinology) Diabetic follow up visit. Follow up in 3-4 months. ? ?Hospital visits:  ?None in previous 6 months ? ?Medications: ?Outpatient Encounter Medications as of 12/04/2021  ?Medication Sig  ? ACCU-CHEK GUIDE test strip 1 each by Other route 2 times daily at 12 noon and 4 pm.  ? amitriptyline (ELAVIL) 10 MG tablet TAKE 2 TABLETS(20 MG) BY MOUTH AT BEDTIME  ? aspirin EC 81 MG tablet Take 81 mg by mouth daily.   ? atenolol (TENORMIN) 50 MG tablet Take 1 tablet (50 mg total) by mouth daily.  ? clopidogrel (PLAVIX) 75 MG tablet Take 1 tablet (75 mg total) by mouth daily.  ? fenofibrate (TRICOR) 145 MG tablet Take 1 tablet (145 mg total) by mouth daily.  ? glimepiride (AMARYL) 4 MG tablet Take by mouth.  ? insulin degludec (TRESIBA FLEXTOUCH) 100 UNIT/ML FlexTouch Pen Inject 70 Units into the skin daily.   ? metFORMIN (GLUCOPHAGE) 500 MG tablet Take 2 tablets (1,000 mg total) by mouth 2 (two) times daily with a meal.  ? neomycin-polymyxin-dexamethasone (MAXITROL) 0.1 % ophthalmic suspension Place 1 drop into the left eye 4 (four) times daily.  ? nitroGLYCERIN (NITROSTAT) 0.4 MG SL tablet Place 0.4 mg under the tongue every 5 (five) minutes as needed.  ? omega-3 acid ethyl esters (LOVAZA) 1 g capsule Take 2 capsules (2 g total) by mouth 2 (two) times daily.  ? pantoprazole (PROTONIX) 20 MG tablet Take 1 tablet (20 mg total) by mouth daily.  ? ramipril (ALTACE) 1.25 MG  capsule Take 1 capsule (1.25 mg total) by mouth daily.  ? rosuvastatin (CRESTOR) 40 MG tablet TAKE 1 TABLET(40 MG) BY MOUTH DAILY  ? vitamin B-12 (CYANOCOBALAMIN) 1000 MCG tablet Take 1,000 mcg by mouth daily.  ? ?No facility-administered encounter medications on file as of 12/04/2021.  ? ?I have attempted to reach out to the patient to complete assessment call. I have called 3x and left 3 voicemail's so patient can call back once available.  ? ? ?Care Gaps: ?Zoster Vaccines:Never done ? ?Star Rating Drugs: ?Ramipril 1.25 mg Last filled:11/15/21 90 DS ?Rosuvastatin 40 mg Last filled:11/30/21 90 DS ?Metformin 500 mg Last filled:11/30/21 90 DS ? ?Corrie Mckusick, RMA ?Health Concierge ? ? ?

## 2021-12-18 DIAGNOSIS — M65312 Trigger thumb, left thumb: Secondary | ICD-10-CM | POA: Diagnosis not present

## 2021-12-24 ENCOUNTER — Telehealth: Payer: Self-pay

## 2021-12-24 NOTE — Chronic Care Management (AMB) (Signed)
? ? ?  Chronic Care Management ?Pharmacy Assistant  ? ?Name: [name]  MRN: [id number] DOB: 21-Jun-1951 ? ?Reason for Encounter: Disease State General ? ?Recent office visits:  ?10/10/21-Brandon T. Ned Card, NP (PCP) Seen for general follow up visit. Labs ordered. Start on MAXITROL 0.1 % ophthalmic suspension 1 drop left eye 4x daily. Follow up in 6 months. ?07/11/21-Brandon T. Ned Card, NP (PCP) General follow up visit. Labs ordered. Flu vaccine given in office. Follow up in 3 months. ? ?Recent consult visits:  ?10/02/21-[name] (Endocrinology) Diabetic follow up visit. Follow up in 3-4 months. ? ?Hospital visits:  ?None in previous 6 months ? ?Medications: ?Outpatient Encounter Medications as of 12/24/2021  ?Medication Sig  ? ACCU-CHEK GUIDE test strip 1 each by Other route 2 times daily at 12 noon and 4 pm.  ? amitriptyline (ELAVIL) 10 MG tablet TAKE 2 TABLETS(20 MG) BY MOUTH AT BEDTIME  ? aspirin EC 81 MG tablet Take 81 mg by mouth daily.   ? atenolol (TENORMIN) 50 MG tablet Take 1 tablet (50 mg total) by mouth daily.  ? clopidogrel (PLAVIX) 75 MG tablet Take 1 tablet (75 mg total) by mouth daily.  ? fenofibrate (TRICOR) 145 MG tablet Take 1 tablet (145 mg total) by mouth daily.  ? glimepiride (AMARYL) 4 MG tablet Take by mouth.  ? insulin degludec (TRESIBA FLEXTOUCH) 100 UNIT/ML FlexTouch Pen Inject 70 Units into the skin daily.   ? metFORMIN (GLUCOPHAGE) 500 MG tablet Take 2 tablets (1,000 mg total) by mouth 2 (two) times daily with a meal.  ? neomycin-polymyxin-dexamethasone (MAXITROL) 0.1 % ophthalmic suspension Place 1 drop into the left eye 4 (four) times daily.  ? nitroGLYCERIN (NITROSTAT) 0.4 MG SL tablet Place 0.4 mg under the tongue every 5 (five) minutes as needed.  ? omega-3 acid ethyl esters (LOVAZA) 1 g capsule Take 2 capsules (2 g total) by mouth 2 (two) times daily.  ? pantoprazole (PROTONIX) 20 MG tablet Take 1 tablet (20 mg total) by mouth daily.  ? ramipril (ALTACE) 1.25 MG  capsule Take 1 capsule (1.25 mg total) by mouth daily.  ? rosuvastatin (CRESTOR) 40 MG tablet TAKE 1 TABLET(40 MG) BY MOUTH DAILY  ? vitamin B-12 (CYANOCOBALAMIN) 1000 MCG tablet Take 1,000 mcg by mouth daily.  ? ?No facility-administered encounter medications on file as of 12/24/2021.  ? ?Unsuccessful attempts to complete assessment call. I have called patient 3x and left 3 voicemail's for the patient to return my call when available. ? ? ?Care Gaps: ?Zoster Vaccines:Never done ?OPHTHALMOLOGY EXAM:Last completed: Dec 20, 2020 ? ?Star Rating Drugs: ?Ramipril 1.25 mg Last filled:11/15/21 90 DS ?Rosuvastatin 40 mg Last filled:11/30/21 90 DS ?Metformin 500 mg Last filled:11/30/21 90 DS ? ?[name], RMA ?Health Concierge ? ?

## 2022-01-28 ENCOUNTER — Other Ambulatory Visit: Payer: Self-pay | Admitting: Nurse Practitioner

## 2022-01-29 NOTE — Telephone Encounter (Signed)
last RF 01/24/21 #[id number] RF- requested too soon- should have enough refill to last until 04/26/22 Requested Prescriptions  Refused Prescriptions Disp Refills  . clopidogrel (PLAVIX) 75 MG tablet [Pharmacy Med Name: CLOPIDOGREL 75 MG Tablet] 90 tablet 4    Sig: TAKE 1 TABLET (75 MG TOTAL) BY MOUTH DAILY.     Hematology: Antiplatelets - clopidogrel Failed - 01/28/2022  2:55 AM      Failed - HCT in normal range and within 180 days    Hematocrit  Date Value Ref Range Status  03/14/2021 43.7 37.5 - 51.0 % Final         Failed - HGB in normal range and within 180 days    Hemoglobin  Date Value Ref Range Status  03/14/2021 14.4 13.0 - 17.7 g/dL Final         Failed - PLT in normal range and within 180 days    Platelets  Date Value Ref Range Status  03/14/2021 215 150 - 450 x10E3/uL Final         Passed - Cr in normal range and within 360 days    Creatinine  Date Value Ref Range Status  09/20/2014 1.10 0.60 - 1.30 mg/dL Final   Creatinine, Ser  Date Value Ref Range Status  10/10/2021 1.24 0.76 - 1.27 mg/dL Final         Passed - Valid encounter within last 6 months    Recent Outpatient Visits          3 months ago Type 2 diabetes mellitus with hyperglycemia, with long-term current use of insulin ([name])   [place], [name], [name]   6 months ago Type 2 diabetes mellitus with hyperglycemia, with long-term current use of insulin ([name])   [name], [name], [name]   10 months ago Type 2 diabetes mellitus with hyperglycemia, with long-term current use of insulin ([name])   [name], [name], [name]   1 year ago Type 2 diabetes mellitus with hyperglycemia, [name], [name], [name]   1 year ago Lab test positive for detection of COVID-19 virus   [name], [name], [name]      Future Appointments            In 2 months [name], [name], [name]  [name], [name]   In 3 months  [name], [name]           . ramipril (ALTACE) 1.25 MG capsule [Pharmacy Med Name: RAMIPRIL 1.25 MG Capsule] 90 capsule 4    Sig: TAKE 1 CAPSULE (1.25 MG TOTAL) BY MOUTH DAILY.     Cardiovascular:  ACE Inhibitors Passed - 01/28/2022  2:55 AM      Passed - Cr in normal range and within 180 days    Creatinine  Date Value Ref Range Status  09/20/2014 1.10 0.60 - 1.30 mg/dL Final   Creatinine, Ser  Date Value Ref Range Status  10/10/2021 1.24 0.76 - 1.27 mg/dL Final         Passed - K in normal range and within 180 days    Potassium  Date Value Ref Range Status  10/10/2021 4.7 3.5 - 5.2 mmol/L Final  08/16/2014 4.9 3.5 - 5.1 mmol/L Final         Passed - Patient is not pregnant      Passed - Last BP in normal range  BP Readings from Last 1 Encounters:  10/10/21 126/82         Passed - Valid encounter within last 6 months    Recent Outpatient Visits          3 months ago Type 2 diabetes mellitus with hyperglycemia, with long-term current use of insulin ([name])   [place], [name], [name]   6 months ago Type 2 diabetes mellitus with hyperglycemia, with long-term current use of insulin ([place])   [place], [name], [name]   10 months ago Type 2 diabetes mellitus with hyperglycemia, with long-term current use of insulin ([name])   [place], [name], [name]   1 year ago Type 2 diabetes mellitus with hyperglycemia, with long-term current use of insulin ([name])   [place], [name], [name]   1 year ago Lab test positive for detection of COVID-19 virus   [name], [name], [name]      Future Appointments            In 2 months [name], [name], [name] [name], [name]   In 3 months  [name], [name] 90 tablet  4    Sig: TAKE 1 TABLET (50 MG TOTAL) BY MOUTH DAILY.     Cardiovascular: Beta Blockers 2 Passed - 01/28/2022  2:55 AM      Passed - Cr in normal range and within 360 days    Creatinine  Date Value Ref Range Status  09/20/2014 1.10 0.60 - 1.30 mg/dL Final   Creatinine, Ser  Date Value Ref Range Status  10/10/2021 1.24 0.76 - 1.27 mg/dL Final         Passed - Last BP in normal range    BP Readings from Last 1 Encounters:  10/10/21 126/82         Passed - Last Heart Rate in normal range    Pulse Readings from Last 1 Encounters:  10/10/21 (!) 15         Passed - Valid encounter within last 6 months    Recent Outpatient Visits          3 months ago Type 2 diabetes mellitus with hyperglycemia, with long-term current use of insulin ([name])   [place], [place], [name]   6 months ago Type 2 diabetes mellitus with hyperglycemia, with long-term current use of insulin ([name])   [place], [name], [name]   10 months ago Type 2 diabetes mellitus with hyperglycemia, with long-term current use of insulin ([name])   [place], [name] Fowler, [name]   1 year ago Type 2 diabetes mellitus with hyperglycemia, with long-term current use of insulin ([name])   [name], [name], [name]   1 year ago Lab test positive for detection of COVID-19 virus   [name], [name], [name]      Future Appointments            In 2 months [name], [name], [name] [name], [name]   In 3 months  [name], [facility]           ]

## 2022-02-10 ENCOUNTER — Other Ambulatory Visit: Payer: Self-pay | Admitting: Nurse Practitioner

## 2022-02-11 ENCOUNTER — Other Ambulatory Visit: Payer: Self-pay | Admitting: Nurse Practitioner

## 2022-02-11 NOTE — Telephone Encounter (Signed)
Requested Prescriptions  Pending Prescriptions Disp Refills  . ramipril (ALTACE) 1.25 MG capsule [Pharmacy Med Name: RAMIPRIL 1.25 MG Capsule] 90 capsule 4    Sig: TAKE 1 CAPSULE (1.25 MG TOTAL) BY MOUTH DAILY.     Cardiovascular:  ACE Inhibitors Passed - 02/10/2022  4:26 PM      Passed - Cr in normal range and within 180 days    Creatinine  Date Value Ref Range Status  09/20/2014 1.10 0.60 - 1.30 mg/dL Final   Creatinine, Ser  Date Value Ref Range Status  10/10/2021 1.24 0.76 - 1.27 mg/dL Final         Passed - K in normal range and within 180 days    Potassium  Date Value Ref Range Status  10/10/2021 4.7 3.5 - 5.2 mmol/L Final  08/16/2014 4.9 3.5 - 5.1 mmol/L Final         Passed - Patient is not pregnant      Passed - Last BP in normal range    BP Readings from Last 1 Encounters:  10/10/21 126/82         Passed - Valid encounter within last 6 months    Recent Outpatient Visits          4 months ago Type 2 diabetes mellitus with hyperglycemia, with long-term current use of insulin ([place])   [place], Jolene T, NP   7 months ago Type 2 diabetes mellitus with hyperglycemia, with long-term current use of insulin ([place])   [place], Jolene T, NP   11 months ago Type 2 diabetes mellitus with hyperglycemia, with long-term current use of insulin ([place])   [place], [place] T, NP   1 year ago Type 2 diabetes mellitus with hyperglycemia, with long-term current use of insulin ([place])   [place], Henrine Screws T, NP   1 year ago Lab test positive for detection of COVID-19 virus   [place], Barbaraann Faster, NP      Future Appointments            In 1 month Cannady, Barbaraann Faster, NP [name]   In 3 months  [name]

## 2022-02-11 NOTE — Telephone Encounter (Signed)
Requested Prescriptions  Pending Prescriptions Disp Refills  . amitriptyline (ELAVIL) 10 MG tablet [Pharmacy Med Name: AMITRIPTYLINE HYDROCHLORIDE 10 MG Tablet] 180 tablet 0    Sig: TAKE 2 TABLETS AT BEDTIME     Psychiatry:  Antidepressants - Heterocyclics (TCAs) Passed - 02/11/2022  3:16 AM      Passed - Valid encounter within last 6 months    Recent Outpatient Visits          4 months ago Type 2 diabetes mellitus with hyperglycemia, with long-term current use of insulin ([place])   [place], Jolene T, NP   7 months ago Type 2 diabetes mellitus with hyperglycemia, with long-term current use of insulin ([place])   [place], Jolene T, NP   11 months ago Type 2 diabetes mellitus with hyperglycemia, with long-term current use of insulin ([place])   [place], [place] T, NP   1 year ago Type 2 diabetes mellitus with hyperglycemia, with long-term current use of insulin ([place])   [place], Henrine Screws T, NP   1 year ago Lab test positive for detection of COVID-19 virus   [place], Barbaraann Faster, NP      Future Appointments            In 1 month Cannady, Barbaraann Faster, NP [name]   In 3 months  [name]           . rosuvastatin (CRESTOR) 40 MG tablet [Pharmacy Med Name: ROSUVASTATIN CALCIUM 40 MG Tablet] 90 tablet 0    Sig: TAKE 1 TABLET(40 MG) BY MOUTH DAILY     Cardiovascular:  Antilipid - Statins 2 Failed - 02/11/2022  3:16 AM      Failed - Lipid Panel in normal range within the last 12 months    Cholesterol, Total  Date Value Ref Range Status  10/10/2021 178 100 - 199 mg/dL Final   LDL Chol Calc (NIH)  Date Value Ref Range Status  10/10/2021 72 0 - 99 mg/dL Final   HDL  Date Value Ref Range Status  10/10/2021 33 (L) >39 mg/dL Final   Triglycerides  Date Value Ref Range Status  10/10/2021 467 (H) 0 - 149 mg/dL Final         Passed - Cr in  normal range and within 360 days    Creatinine  Date Value Ref Range Status  09/20/2014 1.10 0.60 - 1.30 mg/dL Final   Creatinine, Ser  Date Value Ref Range Status  10/10/2021 1.24 0.76 - 1.27 mg/dL Final         Passed - Patient is not pregnant      Passed - Valid encounter within last 12 months    Recent Outpatient Visits          4 months ago Type 2 diabetes mellitus with hyperglycemia, with long-term current use of insulin ([place])   [place], Jolene T, NP   7 months ago Type 2 diabetes mellitus with hyperglycemia, with long-term current use of insulin ([place])   [place], Jolene T, NP   11 months ago Type 2 diabetes mellitus with hyperglycemia, with long-term current use of insulin ([place])   [place], [place] T, NP   1 year ago Type 2 diabetes mellitus with hyperglycemia, with long-term current use of insulin ([place])   [place], [place] T, NP   1 year ago Lab test positive  for detection of COVID-19 virus   [facility], Strong T, NP      Future Appointments            In 1 month Cannady, Barbaraann Faster, NP [name]   In 3 months  [name]           . metFORMIN (GLUCOPHAGE) 500 MG tablet [employer] Med Name: METFORMIN HYDROCHLORIDE 500 MG Tablet] 360 tablet 0    Sig: TAKE 2 TABLETS TWICE DAILY WITH MEALS     Endocrinology:  Diabetes - Biguanides Failed - 02/11/2022  3:16 AM      Failed - HBA1C is between 0 and 7.9 and within 180 days    Hemoglobin A1C  Date Value Ref Range Status  11/06/2019 7.2  Final         Passed - Cr in normal range and within 360 days    Creatinine  Date Value Ref Range Status  09/20/2014 1.10 0.60 - 1.30 mg/dL Final   Creatinine, Ser  Date Value Ref Range Status  10/10/2021 1.24 0.76 - 1.27 mg/dL Final         Passed - eGFR in normal range and within 360 days    EGFR (African American)  Date  Value Ref Range Status  09/20/2014 >60 >51m/min Final  05/29/2014 >60  Final   GFR calc Af Amer  Date Value Ref Range Status  09/03/2020 74 >59 mL/min/1.73 Final    Comment:    **In accordance with recommendations from the NKF-ASN Task force,**   [facility] is in the process of updating its eGFR calculation to the   2021 CKD-EPI creatinine equation that estimates kidney function   without a race variable.    EGFR (Non-African Amer.)  Date Value Ref Range Status  09/20/2014 >60 >664mmin Final    Comment:    eGFR values <6027min/1.73 m2 may be an indication of chronic kidney disease (CKD). Calculated eGFR, using the MRDR Study equation, is useful in  patients with stable renal function. The eGFR calculation will not be reliable in acutely ill patients when serum creatinine is changing rapidly. It is not useful in patients on dialysis. The eGFR calculation may not be applicable to patients at the low and high extremes of body sizes, pregnant women, and vegetarians.   05/29/2014 >60  Final    Comment:    eGFR values <32m43mn/1.73 m2 may be an indication of chronic kidney disease (CKD). Calculated eGFR is useful in patients with stable renal function. The eGFR calculation will not be reliable in acutely ill patients when serum creatinine is changing rapidly. It is not useful in  patients on dialysis. The eGFR calculation may not be applicable to patients at the low and high extremes of body sizes, pregnant women, and vegetarians.    GFR calc non Af Amer  Date Value Ref Range Status  09/03/2020 64 >59 mL/min/1.73 Final   eGFR  Date Value Ref Range Status  10/10/2021 62 >59 mL/min/1.73 Final         Passed - B12 Level in normal range and within 720 days    Vitamin B-12  Date Value Ref Range Status  10/10/2021 957 232 - 1,245 pg/mL Final         Passed - Valid encounter within last 6 months    Recent Outpatient Visits          4 months ago Type 2 diabetes mellitus  with hyperglycemia, with long-term current use  of insulin ([place])   [place], [place] T, NP   7 months ago Type 2 diabetes mellitus with hyperglycemia, with long-term current use of insulin ([place])   [place], Jolene T, NP   11 months ago Type 2 diabetes mellitus with hyperglycemia, with long-term current use of insulin ([place])   [place], Soledad T, NP   1 year ago Type 2 diabetes mellitus with hyperglycemia, with long-term current use of insulin ([place])   [place], [place] T, NP   1 year ago Lab test positive for detection of COVID-19 virus   [place], [place] T, NP      Future Appointments            In 1 month Cannady, Barbaraann Faster, NP [name]   In 3 months  [name]           Passed - CBC within normal limits and completed in the last 12 months    WBC  Date Value Ref Range Status  03/14/2021 4.6 3.4 - 10.8 x10E3/uL Final  02/10/2020 8.3 4.0 - 10.5 K/uL Final   RBC  Date Value Ref Range Status  03/14/2021 5.01 4.14 - 5.80 x10E6/uL Final  02/10/2020 4.15 (L) 4.22 - 5.81 MIL/uL Final   Hemoglobin  Date Value Ref Range Status  03/14/2021 14.4 13.0 - 17.7 g/dL Final   Hematocrit  Date Value Ref Range Status  03/14/2021 43.7 37.5 - 51.0 % Final   MCHC  Date Value Ref Range Status  03/14/2021 33.0 31.5 - 35.7 g/dL Final  02/10/2020 34.4 30.0 - 36.0 g/dL Final   [facility]  Date Value Ref Range Status  03/14/2021 28.7 26.6 - 33.0 pg Final  02/10/2020 31.1 26.0 - 34.0 pg Final   MCV  Date Value Ref Range Status  03/14/2021 87 79 - 97 fL Final  08/16/2014 95 80 - 100 fL Final   No results found for: PLTCOUNTKUC, LABPLAT, POCPLA RDW  Date Value Ref Range Status  03/14/2021 14.9 11.6 - 15.4 % Final  08/16/2014 13.4 11.5 - 14.5 % Final         . fenofibrate (TRICOR) 145 MG tablet [Pharmacy Med Name: FENOFIBRATE 145 MG  Tablet] 90 tablet 0    Sig: TAKE 1 TABLET (145 MG TOTAL) BY MOUTH DAILY.     Cardiovascular:  Antilipid - Fibric Acid Derivatives Failed - 02/11/2022  3:16 AM      Failed - Lipid Panel in normal range within the last 12 months    Cholesterol, Total  Date Value Ref Range Status  10/10/2021 178 100 - 199 mg/dL Final   LDL Chol Calc (NIH)  Date Value Ref Range Status  10/10/2021 72 0 - 99 mg/dL Final   HDL  Date Value Ref Range Status  10/10/2021 33 (L) >39 mg/dL Final   Triglycerides  Date Value Ref Range Status  10/10/2021 467 (H) 0 - 149 mg/dL Final         Passed - ALT in normal range and within 360 days    ALT  Date Value Ref Range Status  10/10/2021 14 0 - 44 IU/L Final         Passed - AST in normal range and within 360 days    AST  Date Value Ref Range Status  10/10/2021 13 0 - 40 IU/L Final         Passed - Cr in normal  range and within 360 days    Creatinine  Date Value Ref Range Status  09/20/2014 1.10 0.60 - 1.30 mg/dL Final   Creatinine, Ser  Date Value Ref Range Status  10/10/2021 1.24 0.76 - 1.27 mg/dL Final         Passed - HGB in normal range and within 360 days    Hemoglobin  Date Value Ref Range Status  03/14/2021 14.4 13.0 - 17.7 g/dL Final         Passed - HCT in normal range and within 360 days    Hematocrit  Date Value Ref Range Status  03/14/2021 43.7 37.5 - 51.0 % Final         Passed - PLT in normal range and within 360 days    Platelets  Date Value Ref Range Status  03/14/2021 215 150 - 450 x10E3/uL Final         Passed - WBC in normal range and within 360 days    WBC  Date Value Ref Range Status  03/14/2021 4.6 3.4 - 10.8 x10E3/uL Final  02/10/2020 8.3 4.0 - 10.5 K/uL Final         Passed - eGFR is 30 or above and within 360 days    EGFR (African American)  Date Value Ref Range Status  09/20/2014 >60 >44m/min Final  05/29/2014 >60  Final   GFR calc Af Amer  Date Value Ref Range Status  09/03/2020 74 >59  mL/min/1.73 Final    Comment:    **In accordance with recommendations from the NKF-ASN Task force,**   [facility] is in the process of updating its eGFR calculation to the   2021 CKD-EPI creatinine equation that estimates kidney function   without a race variable.    EGFR (Non-African Amer.)  Date Value Ref Range Status  09/20/2014 >60 >686mmin Final    Comment:    eGFR values <6015min/1.73 m2 may be an indication of chronic kidney disease (CKD). Calculated eGFR, using the MRDR Study equation, is useful in  patients with stable renal function. The eGFR calculation will not be reliable in acutely ill patients when serum creatinine is changing rapidly. It is not useful in patients on dialysis. The eGFR calculation may not be applicable to patients at the low and high extremes of body sizes, pregnant women, and vegetarians.   05/29/2014 >60  Final    Comment:    eGFR values <58m48mn/1.73 m2 may be an indication of chronic kidney disease (CKD). Calculated eGFR is useful in patients with stable renal function. The eGFR calculation will not be reliable in acutely ill patients when serum creatinine is changing rapidly. It is not useful in  patients on dialysis. The eGFR calculation may not be applicable to patients at the low and high extremes of body sizes, pregnant women, and vegetarians.    GFR calc non Af Amer  Date Value Ref Range Status  09/03/2020 64 >59 mL/min/1.73 Final   eGFR  Date Value Ref Range Status  10/10/2021 62 >59 mL/min/1.73 Final         Passed - Valid encounter within last 12 months    Recent Outpatient Visits          4 months ago Type 2 diabetes mellitus with hyperglycemia, with long-term current use of insulin ([facility])[place], [place]   7 months ago Type 2 diabetes mellitus with hyperglycemia, with long-term current use of insulin ([facility]   11 months ago Type 2 diabetes  mellitus with hyperglycemia, with long-term current use of insulin ([place])   [place], [place] T, NP   1 year ago Type 2 diabetes mellitus with hyperglycemia, with long-term current use of insulin ([place])   [employer] [place], [place] T, NP   1 year ago Lab test positive for detection of COVID-19 virus   [place], Barbaraann Faster, NP      Future Appointments            In 1 month Cannady, Barbaraann Faster, NP [name]   In 3 months  [name]

## 2022-02-18 ENCOUNTER — Telehealth: Payer: Self-pay

## 2022-02-18 NOTE — Progress Notes (Signed)
Chronic Care Management Pharmacy Assistant   Name: [name]  MRN: [id number] DOB: [birth year]   Reason for Encounter: Disease State-General    Recent office visits:  None since last coordination call 12/24/21  Recent consult visits:  None since last coordination call 12/24/21  Hospital visits:  None since last coordination call 12/24/21  Medications: Outpatient Encounter Medications as of 02/18/2022  Medication Sig   ACCU-CHEK GUIDE test strip 1 each by Other route 2 times daily at 12 noon and 4 pm.   amitriptyline (ELAVIL) 10 MG tablet TAKE 2 TABLETS AT BEDTIME   aspirin EC 81 MG tablet Take 81 mg by mouth daily.    atenolol (TENORMIN) 50 MG tablet Take 1 tablet (50 mg total) by mouth daily.   clopidogrel (PLAVIX) 75 MG tablet Take 1 tablet (75 mg total) by mouth daily.   fenofibrate (TRICOR) 145 MG tablet TAKE 1 TABLET (145 MG TOTAL) BY MOUTH DAILY.   glimepiride (AMARYL) 4 MG tablet Take by mouth.   insulin degludec (TRESIBA FLEXTOUCH) 100 UNIT/ML FlexTouch Pen Inject 70 Units into the skin daily.    metFORMIN (GLUCOPHAGE) 500 MG tablet TAKE 2 TABLETS TWICE DAILY WITH MEALS   neomycin-polymyxin-dexamethasone (MAXITROL) 0.1 % ophthalmic suspension Place 1 drop into the left eye 4 (four) times daily.   nitroGLYCERIN (NITROSTAT) 0.4 MG SL tablet Place 0.4 mg under the tongue every 5 (five) minutes as needed.   omega-3 acid ethyl esters (LOVAZA) 1 g capsule Take 2 capsules (2 g total) by mouth 2 (two) times daily.   pantoprazole (PROTONIX) 20 MG tablet Take 1 tablet (20 mg total) by mouth daily.   ramipril (ALTACE) 1.25 MG capsule TAKE 1 CAPSULE (1.25 MG TOTAL) BY MOUTH DAILY.   rosuvastatin (CRESTOR) 40 MG tablet TAKE 1 TABLET(40 MG) BY MOUTH DAILY   vitamin B-12 (CYANOCOBALAMIN) 1000 MCG tablet Take 1,000 mcg by mouth daily.   No facility-administered encounter medications on file as of 02/18/2022.   [place] for General Review Call   Chart  Review:  Have there been any documented new, changed, or discontinued medications since last visit? No (If yes, include name, dose, frequency, date) Has there been any documented recent hospitalizations or ED visits since last visit with Clinical Pharmacist? No Brief Summary (including medication and/or Diagnosis changes):   Adherence Review:  Does the Clinical Pharmacist Assistant have access to adherence rates? Yes Adherence rates for STAR metric medications (List medication(s)/day supply/ last 2 fill dates). Adherence rates for medications indicated for disease state being reviewed (List medication(s)/day supply/ last 2 fill dates). Does the patient have >5 day gap between last estimated fill dates for any of the above medications or other medication gaps? No Reason for medication gaps.   Disease State Questions:  Able to connect with Patient? Yes  Did patient have any problems with their health recently? No Note problems and Concerns:  Have you had any admissions or emergency room visits or worsening of your condition(s) since last visit? No Details of ED visit, hospital visit and/or worsening condition(s):  Have you had any visits with new specialists or providers since your last visit? No Explain:  Have you had any new health care problem(s) since your last visit? No New problem(s) reported:  Have you run out of any of your medications since you last spoke with clinical pharmacist? No What caused you to run out of your medications?  Are there any medications you are not taking as prescribed? No  What kept you from taking your medications as prescribed?  Are you having any issues or side effects with your medications? No Note of issues or side effects:  Do you have any other health concerns or questions you want to discuss with your Clinical Pharmacist before your next visit? No Note additional concerns and questions from Patient.  Are there any health concerns that  you feel we can do a better job addressing? No Note Patient's response.  Are you having any problems with any of the following since the last visit: (select all that apply)  None  Details:  12. Any falls since last visit? No  Details:  13. Any increased or uncontrolled pain since last visit? No  Details:  14. Next visit Type:Office       Jolene Cannady-PCP       04/09/22 @ 9:40 am 15. Additional Details? No    Care Gaps: Colonoscopy-12/13/17 Diabetic Foot Exam-03/14/21 Ophthalmology-12/20/20 Dexa Scan - NA Annual Well Visit -  Micro albumin-10/10/21 Hemoglobin A1c-10/02/21  Star Rating Drugs: Rosuvastatin 40 mg-last fill 11/30/21 90 ds Metformin 500 mg-last fill 11/30/21 90 ds Ramipril 1.25 mg-last fill 11/15/21 90 ds  Ethelene Hal Clinical Pharmacist Assistant [phone]

## 2022-03-09 ENCOUNTER — Other Ambulatory Visit: Payer: Self-pay | Admitting: Nurse Practitioner

## 2022-04-05 NOTE — Patient Instructions (Signed)

## 2022-04-09 ENCOUNTER — Encounter: Payer: Self-pay | Admitting: Nurse Practitioner

## 2022-04-09 ENCOUNTER — Ambulatory Visit (INDEPENDENT_AMBULATORY_CARE_PROVIDER_SITE_OTHER): Payer: Medicare HMO | Admitting: Nurse Practitioner

## 2022-04-09 VITALS — BP 120/72 | HR 57 | Temp 98.0°F | Ht 72.0 in | Wt 177.4 lb

## 2022-04-09 DIAGNOSIS — E1165 Type 2 diabetes mellitus with hyperglycemia: Secondary | ICD-10-CM

## 2022-04-09 DIAGNOSIS — E1169 Type 2 diabetes mellitus with other specified complication: Secondary | ICD-10-CM

## 2022-04-09 DIAGNOSIS — E785 Hyperlipidemia, unspecified: Secondary | ICD-10-CM

## 2022-04-09 DIAGNOSIS — E1122 Type 2 diabetes mellitus with diabetic chronic kidney disease: Secondary | ICD-10-CM

## 2022-04-09 DIAGNOSIS — F5101 Primary insomnia: Secondary | ICD-10-CM | POA: Diagnosis not present

## 2022-04-09 DIAGNOSIS — E781 Pure hyperglyceridemia: Secondary | ICD-10-CM | POA: Diagnosis not present

## 2022-04-09 DIAGNOSIS — I152 Hypertension secondary to endocrine disorders: Secondary | ICD-10-CM

## 2022-04-09 DIAGNOSIS — E1129 Type 2 diabetes mellitus with other diabetic kidney complication: Secondary | ICD-10-CM | POA: Diagnosis not present

## 2022-04-09 DIAGNOSIS — N183 Chronic kidney disease, stage 3 unspecified: Secondary | ICD-10-CM

## 2022-04-09 DIAGNOSIS — Z794 Long term (current) use of insulin: Secondary | ICD-10-CM

## 2022-04-09 DIAGNOSIS — E1159 Type 2 diabetes mellitus with other circulatory complications: Secondary | ICD-10-CM | POA: Diagnosis not present

## 2022-04-09 DIAGNOSIS — R809 Proteinuria, unspecified: Secondary | ICD-10-CM | POA: Diagnosis not present

## 2022-04-09 MED ORDER — BELSOMRA 10 MG PO TABS: 10.0000 mg | ORAL_TABLET | Freq: Every evening | ORAL | 5 refills | Status: DC | PRN

## 2022-04-09 MED ORDER — SHINGRIX 50 MCG/0.5ML IM SUSR: 0.5000 mL | Freq: Once | INTRAMUSCULAR | 0 refills | Status: AC

## 2022-04-09 NOTE — Progress Notes (Signed)
BP 120/72   Pulse (!) 57   Temp 98 F (36.7 C) (Oral)   Ht 6' (1.829 m)   Wt 177 lb 6.4 oz (80.5 kg)   SpO2 97%   BMI 24.06 kg/m    Subjective:    Patient ID: [name], male    DOB: 1951/08/10, 71 y.o.   MRN: [id number]  HPI: [name] is a 71 y.o. male  Chief Complaint  Patient presents with   Diabetes    Patient declines having a recent Diabetic Eye Exam.    Hyperlipidemia   Hypertension   Chronic Kidney Disease   DIABETES Followed by endocrinology and last saw Dr. Honor Junes on 09/22/21 with A1c 9.6%, sees him again next month and has labs ordered for that visit -- currently using Tresiba, Metformin, Glimepiride.  Was diagnosed >10 years ago.  Tried Jardiance in past, but was too costly.  History of low B12 levels, is taking supplement daily. Hypoglycemic episodes:no Polydipsia/polyuria: no Visual disturbance: no Chest pain: no Paresthesias: no Glucose Monitoring: yes             Accucheck frequency: Daily             Fasting glucose: 200 in the morning, has labs upcoming with endo             Post prandial:             Evening:             Before meals: Taking Insulin?: yes             Long acting insulin: Tresiba 70 units             Short acting insulin: Blood Pressure Monitoring: not checking Retinal Examination: Not Up To Date -- [facility], needs to schedule Foot Exam: Up to Date Pneumovax: Up to Date Influenza: Up to Date Aspirin: yes   CHRONIC KIDNEY DISEASE Recent labs showed stable kidney.  Magnesium level remained a little low recent check -- taking magnesium at home, continues on Protonix CKD status: stable   Medications renally dose: yes Previous renal evaluation: no Pneumovax:  Up to Date Influenza Vaccine:  Up to Date    HYPERTENSION / HYPERLIPIDEMIA History of CABG in 1998, stent in 2006, catheterization in 2011.  Taking Crestor for HLD.  Last seen by Dr. Ubaldo Glassing 03/18/21, to continue dual anti-platelet therapy -- sees them  yearly. Quit smoking in 1998, smoked about 2 PPD.    Taking Ramipril 1.25 MG, Atenolol 50 MG, ASA, Fenofibrate, Crestor, Lovaza, and Plavix.  Last echo in 2019 and EF 55% .  Has not had to use NTG recently. Satisfied with current treatment? yes Duration of hypertension: chronic BP monitoring frequency: not checking BP range:  BP medication side effects: no Duration of hyperlipidemia: chronic Cholesterol medication side effects: no Cholesterol supplements: none Medication compliance: good compliance Aspirin: yes Recent stressors: no Recurrent headaches: no Visual changes: no Palpitations: no Dyspnea: no Chest pain: no Lower extremity edema: no Dizzy/lightheaded: no    INSOMNIA Continues Amitriptyline 20 MG at bedtime.  He continues to have issues with falling asleep.  Has tried Trazodone, Ambien, Melatonin in past.  Feels like medication will work good and then stop working.  Has never had a sleep study, does not snore at night. Duration: chronic Satisfied with sleep quality: no Difficulty falling asleep: yes Difficulty staying asleep: yes Waking a few hours after sleep onset: yes Early morning awakenings: no Daytime hypersomnolence:  no Wakes feeling refreshed: yes Good sleep hygiene: yes Apnea: no Snoring: no Depressed/anxious mood: no Recent stress: no Restless legs/nocturnal leg cramps: no Chronic pain/arthritis: no History of sleep study: yes in past, was negative Treatments attempted: ambien, Trazodone, Melatonin   Relevant past medical, surgical, family and social history reviewed and updated as indicated. Interim medical history since our last visit reviewed. Allergies and medications reviewed and updated.  Review of Systems  Constitutional:  Negative for activity change, diaphoresis, fatigue and fever.  Respiratory:  Negative for cough, chest tightness, shortness of breath and wheezing.   Cardiovascular:  Negative for chest pain, palpitations and leg swelling.   Gastrointestinal: Negative.   Endocrine: Negative for polydipsia, polyphagia and polyuria.  Neurological: Negative.   Psychiatric/Behavioral: Negative.      Per HPI unless specifically indicated above     Objective:    BP 120/72   Pulse (!) 57   Temp 98 F (36.7 C) (Oral)   Ht 6' (1.829 m)   Wt 177 lb 6.4 oz (80.5 kg)   SpO2 97%   BMI 24.06 kg/m   Wt Readings from Last 3 Encounters:  04/09/22 177 lb 6.4 oz (80.5 kg)  10/10/21 181 lb (82.1 kg)  07/11/21 178 lb 6.4 oz (80.9 kg)    Physical Exam Vitals and nursing note reviewed.  Constitutional:      General: He is awake. He is not in acute distress.    Appearance: He is well-developed and well-groomed. He is not ill-appearing.  HENT:     Head: Normocephalic and atraumatic.     Right Ear: Hearing normal. No drainage.     Left Ear: Hearing normal. No drainage.  Eyes:     General: Lids are normal. Lids are everted, no foreign bodies appreciated.        Right eye: No foreign body or discharge.        Left eye: No foreign body or discharge.     Conjunctiva/sclera: Conjunctivae normal.     Pupils: Pupils are equal, round, and reactive to light.     Comments: Artificial eye left side with mild erythema to exterior lid, no discharge.  Neck:     Thyroid: No thyromegaly.     Vascular: No carotid bruit.  Cardiovascular:     Rate and Rhythm: Normal rate and regular rhythm.     Heart sounds: Normal heart sounds, S1 normal and S2 normal. No murmur heard.    No gallop.  Pulmonary:     Effort: Pulmonary effort is normal. No accessory muscle usage or respiratory distress.     Breath sounds: Normal breath sounds.  Abdominal:     General: Bowel sounds are normal.     Palpations: Abdomen is soft.  Musculoskeletal:     Cervical back: Normal range of motion and neck supple.     Right lower leg: No edema.     Left lower leg: No edema.  Lymphadenopathy:     Cervical: No cervical adenopathy.  Skin:    General: Skin is warm and  dry.     Capillary Refill: Capillary refill takes less than 2 seconds.  Neurological:     Mental Status: He is alert and oriented to person, place, and time.     Deep Tendon Reflexes: Reflexes are normal and symmetric.     Reflex Scores:      Brachioradialis reflexes are 2+ on the right side and 2+ on the left side.      Patellar reflexes are  2+ on the right side and 2+ on the left side. Psychiatric:        Attention and Perception: Attention normal.        Mood and Affect: Mood normal.        Speech: Speech normal.        Behavior: Behavior normal. Behavior is cooperative.        Thought Content: Thought content normal.    Diabetic Foot Exam - Simple   Simple Foot Form Visual Inspection No deformities, no ulcerations, no other skin breakdown bilaterally: Yes Sensation Testing Intact to touch and monofilament testing bilaterally: Yes Pulse Check Posterior Tibialis and Dorsalis pulse intact bilaterally: Yes Comments     Results for orders placed or performed in visit on 10/10/21  Microalbumin, Urine Waived  Result Value Ref Range   Microalb, Ur Waived 150 (H) 0 - 19 mg/L   Creatinine, Urine Waived 200 10 - 300 mg/dL   Microalb/Creat Ratio 30-300 (H) <30 mg/g  Comprehensive metabolic panel  Result Value Ref Range   Glucose 223 (H) 70 - 99 mg/dL   BUN 14 8 - 27 mg/dL   Creatinine, Ser 1.24 0.76 - 1.27 mg/dL   eGFR 62 >59 mL/min/1.73   BUN/Creatinine Ratio 11 10 - 24   Sodium 134 134 - 144 mmol/L   Potassium 4.7 3.5 - 5.2 mmol/L   Chloride 103 96 - 106 mmol/L   CO2 21 20 - 29 mmol/L   Calcium 9.3 8.6 - 10.2 mg/dL   Total Protein 6.5 6.0 - 8.5 g/dL   Albumin 4.5 3.7 - 4.7 g/dL   Globulin, Total 2.0 1.5 - 4.5 g/dL   Albumin/Globulin Ratio 2.3 (H) 1.2 - 2.2   Bilirubin Total 0.3 0.0 - 1.2 mg/dL   Alkaline Phosphatase 64 44 - 121 IU/L   AST 13 0 - 40 IU/L   ALT 14 0 - 44 IU/L  Lipid Panel w/o Chol/HDL Ratio  Result Value Ref Range   Cholesterol, Total 178 100 - 199  mg/dL   Triglycerides 467 (H) 0 - 149 mg/dL   HDL 33 (L) >39 mg/dL   VLDL Cholesterol Cal 73 (H) 5 - 40 mg/dL   LDL Chol Calc (NIH) 72 0 - 99 mg/dL  Magnesium  Result Value Ref Range   Magnesium 1.4 (L) 1.6 - 2.3 mg/dL  Vitamin B12  Result Value Ref Range   Vitamin B-12 957 232 - 1,245 pg/mL      Assessment & Plan:   Problem List Items Addressed This Visit       Cardiovascular and Mediastinum   Hypertension associated with type 2 diabetes mellitus ([place])    Chronic, ongoing with BP at goal in office on recheck today.  Goal <130/80.  Will continue current medication regimen and cardiology collaboration.  Recommend he monitor BP at home at least a few mornings a week and document for provider + focus on DASH diet.  Could consider addition of Amlodipine if elevation in SBP noted.  BMP today, urine ALB 150 (January 2023), continue Ramipril for kidney protection.  Continue collaboration with cardiology.  Return in 6 months.      Relevant Orders   Basic metabolic panel     Endocrine   CKD stage 3 due to type 2 diabetes mellitus ([place])    Ongoing, stable.  Recheck CMP today.  Urine ALB - 150 on check (January 2023).  Continue Ramipril for kidney protection.  Consider nephrology referral if decline ever noted.  Most  recent A1c 9.6% with endocrinology, continue this collaboration.  Has new labs upcoming.      Relevant Orders   Basic metabolic panel   Hyperlipidemia associated with type 2 diabetes mellitus (HCC)    Chronic, ongoing.  Continue Crestor, Lovaza, and Fenofibrate, tolerating at this time.  Adjust dose as needed.  Lipid panel today.      Relevant Orders   Lipid Panel w/o Chol/HDL Ratio   Type 2 diabetes mellitus with hyperglycemia, with long-term current use of insulin (HCC) - Primary   Relevant Orders   Basic metabolic panel   Type 2 diabetes mellitus with proteinuria (H[facility])    Chronic, ongoing.  Followed by endocrinology with recent A1c 9.6% (has new labs coming up with  endo, will not recheck today) and urine ALB 150 (January 2023) -- continue Ramipril for kidney protection.  Continue current medication regimen as prescribed by endocrinology, notes reviewed.  CCM collaboration continues -- may need help with cost of medications in future. Recommend he check BS at least twice a day, fasting and 2 hours after a meal.  Return in 6 months for follow-up.      Relevant Orders   Basic metabolic panel     Other   Hypertriglyceridemia    Ongoing issue with statin, Fenofibrate, and Fish Oil on board, will recheck fasting today and continue collaboration with cardiology.      Relevant Orders   Lipid Panel w/o Chol/HDL Ratio   Hypomagnesemia    History of lows and taking supplement.  Recheck today and adjust as needed.  Recommend cut back on Protonix use.      Relevant Orders   Magnesium   Insomnia    Chronic, ongoing.  Not sleeping well, has tried multiple medications.  At this time trial Belsomra 10 MG nightly, educated him on this -- he did well with Ambien in past but prefer not to start this due to age and BEERS criteria.  Continue to monitor and adjust regimen as needed.  Recommend sleep hygiene techniques.  Could consider therapy for CBT in future.  Return in 6 weeks.  If tolerates will need UDS and contract, discussed with him.        Follow up plan: Return in about 6 weeks (around 05/21/2022) for INSOMNIA.

## 2022-04-09 NOTE — Assessment & Plan Note (Signed)
Chronic, ongoing.  Followed by endocrinology with recent A1c 9.6% (has new labs coming up with endo, will not recheck today) and urine ALB 150 (January 2023) -- continue Ramipril for kidney protection.  Continue current medication regimen as prescribed by endocrinology, notes reviewed.  CCM collaboration continues -- may need help with cost of medications in future. Recommend he check BS at least twice a day, fasting and 2 hours after a meal.  Return in 6 months for follow-up.

## 2022-04-09 NOTE — Assessment & Plan Note (Addendum)
Chronic, ongoing.  Not sleeping well, has tried multiple medications.  At this time trial Belsomra 10 MG nightly, educated him on this -- he did well with Ambien in past but prefer not to start this due to age and BEERS criteria.  Continue to monitor and adjust regimen as needed.  Recommend sleep hygiene techniques.  Could consider therapy for CBT in future.  Return in 6 weeks.  If tolerates will need UDS and contract, discussed with him.

## 2022-04-09 NOTE — Assessment & Plan Note (Signed)
Ongoing, stable.  Recheck CMP today.  Urine ALB - 150 on check (January 2023).  Continue Ramipril for kidney protection.  Consider nephrology referral if decline ever noted.  Most recent A1c 9.6% with endocrinology, continue this collaboration.  Has new labs upcoming.

## 2022-04-09 NOTE — Assessment & Plan Note (Signed)
Chronic, ongoing with BP at goal in office on recheck today.  Goal <130/80.  Will continue current medication regimen and cardiology collaboration.  Recommend he monitor BP at home at least a few mornings a week and document for provider + focus on DASH diet.  Could consider addition of Amlodipine if elevation in SBP noted.  BMP today, urine ALB 150 (January 2023), continue Ramipril for kidney protection.  Continue collaboration with cardiology.  Return in 6 months.

## 2022-04-09 NOTE — Assessment & Plan Note (Signed)
Ongoing issue with statin, Fenofibrate, and Fish Oil on board, will recheck fasting today and continue collaboration with cardiology.

## 2022-04-09 NOTE — Assessment & Plan Note (Signed)
History of lows and taking supplement.  Recheck today and adjust as needed.  Recommend cut back on Protonix use.

## 2022-04-09 NOTE — Assessment & Plan Note (Signed)
Chronic, ongoing.  Continue Crestor, Lovaza, and Fenofibrate, tolerating at this time.  Adjust dose as needed.  Lipid panel today.

## 2022-04-10 ENCOUNTER — Other Ambulatory Visit: Payer: Self-pay | Admitting: Nurse Practitioner

## 2022-04-10 DIAGNOSIS — E1169 Type 2 diabetes mellitus with other specified complication: Secondary | ICD-10-CM

## 2022-04-10 DIAGNOSIS — N183 Chronic kidney disease, stage 3 unspecified: Secondary | ICD-10-CM

## 2022-04-10 LAB — BASIC METABOLIC PANEL
BUN/Creatinine Ratio: 19 (ref 10–24)
BUN: 22 mg/dL (ref 8–27)
CO2: 18 mmol/L — ABNORMAL LOW (ref 20–29)
Calcium: 9.5 mg/dL (ref 8.6–10.2)
Chloride: 97 mmol/L (ref 96–106)
Creatinine, Ser: 1.15 mg/dL (ref 0.76–1.27)
Glucose: 369 mg/dL — ABNORMAL HIGH (ref 70–99)
Potassium: 5.2 mmol/L (ref 3.5–5.2)
Sodium: 132 mmol/L — ABNORMAL LOW (ref 134–144)
eGFR: 67 mL/min/{1.73_m2} (ref >59)

## 2022-04-10 LAB — LIPID PANEL W/O CHOL/HDL RATIO
Cholesterol, Total: 207 mg/dL — ABNORMAL HIGH (ref 100–199)
HDL: 20 mg/dL — ABNORMAL LOW (ref >39)
Triglycerides: 1600 mg/dL (ref 0–149)

## 2022-04-10 LAB — MAGNESIUM: Magnesium: 1.5 mg/dL — ABNORMAL LOW (ref 1.6–2.3)

## 2022-04-10 NOTE — Progress Notes (Signed)
Contacted via [place] -- needs lab only visit in one week please  Good morning [name], your labs have returned and you have a very high triglycerides!!  I assume you were not fasting for these.  I would like you to return next week early morning to recheck these with you fasting please:)  My staff will call to schedule. - Magnesium level remains on low side, continue supplement and as we discussed try cutting back on Protonix use. - Sugar level was elevated at 369 and sodium (salt) is low, most likely due to the higher sugar level.  I do recommend heavy focus on diet -- try not to eat too many sweets or high carbohydrate foods + add a little salt to diet.  I will recheck these next week with you fasting too.  Ensure to attend visit with Dr. Honor Junes upcoming.  Any questions? Keep being stellar!!  Thank you for allowing me to participate in your care.  I appreciate you. Kindest regards, Cesare Sumlin

## 2022-04-17 ENCOUNTER — Other Ambulatory Visit: Payer: Medicare HMO

## 2022-04-17 DIAGNOSIS — N183 Chronic kidney disease, stage 3 unspecified: Secondary | ICD-10-CM | POA: Diagnosis not present

## 2022-04-17 DIAGNOSIS — E1122 Type 2 diabetes mellitus with diabetic chronic kidney disease: Secondary | ICD-10-CM | POA: Diagnosis not present

## 2022-04-17 DIAGNOSIS — E1169 Type 2 diabetes mellitus with other specified complication: Secondary | ICD-10-CM

## 2022-04-17 DIAGNOSIS — E785 Hyperlipidemia, unspecified: Secondary | ICD-10-CM | POA: Diagnosis not present

## 2022-04-18 LAB — BASIC METABOLIC PANEL
BUN/Creatinine Ratio: 12 (ref 10–24)
BUN: 14 mg/dL (ref 8–27)
CO2: 17 mmol/L — ABNORMAL LOW (ref 20–29)
Calcium: 9.1 mg/dL (ref 8.6–10.2)
Chloride: 99 mmol/L (ref 96–106)
Creatinine, Ser: 1.17 mg/dL (ref 0.76–1.27)
Glucose: 269 mg/dL — ABNORMAL HIGH (ref 70–99)
Potassium: 4.8 mmol/L (ref 3.5–5.2)
Sodium: 136 mmol/L (ref 134–144)
eGFR: 66 mL/min/{1.73_m2} (ref >59)

## 2022-04-18 LAB — LIPID PANEL W/O CHOL/HDL RATIO
Cholesterol, Total: 186 mg/dL (ref 100–199)
HDL: 26 mg/dL — ABNORMAL LOW (ref >39)
Triglycerides: 1040 mg/dL (ref 0–149)

## 2022-04-19 NOTE — Progress Notes (Signed)
Good morning, please let patient know labs have returned (please call): - [name] your triglycerides are still very high at >1000 this places you at very high risk for pancreatitis.  Were you fasting for these labs?  If not we need to check these labs fasting.  If you were fasting, then are you taking your Fenofibrate, Lovaza, and Rosuvastatin daily? Please ensure you are .  Also ensure heavy focus on diet, less saturated fats and red meat + less alcohol if used.  If you are taking your medication daily and were fasting for these labs then I recommend we stop Rosuvastatin and start and injectable medication for cholesterol like Praluent or Repatha.  Please let me know answers to questions, then we will determine next steps to take.  Thank you:)  Have a great day!!

## 2022-04-20 ENCOUNTER — Other Ambulatory Visit: Payer: Self-pay | Admitting: Nurse Practitioner

## 2022-04-20 MED ORDER — REPATHA 140 MG/ML ~~LOC~~ SOSY
140.0000 mg | PREFILLED_SYRINGE | SUBCUTANEOUS | 5 refills | Status: DC
Start: 2022-04-20 — End: 2022-07-31

## 2022-04-23 ENCOUNTER — Telehealth: Payer: Self-pay

## 2022-04-23 DIAGNOSIS — E1165 Type 2 diabetes mellitus with hyperglycemia: Secondary | ICD-10-CM | POA: Diagnosis not present

## 2022-04-23 DIAGNOSIS — E1159 Type 2 diabetes mellitus with other circulatory complications: Secondary | ICD-10-CM | POA: Diagnosis not present

## 2022-04-23 DIAGNOSIS — I152 Hypertension secondary to endocrine disorders: Secondary | ICD-10-CM | POA: Diagnosis not present

## 2022-04-23 DIAGNOSIS — E785 Hyperlipidemia, unspecified: Secondary | ICD-10-CM | POA: Diagnosis not present

## 2022-04-23 DIAGNOSIS — E1169 Type 2 diabetes mellitus with other specified complication: Secondary | ICD-10-CM | POA: Diagnosis not present

## 2022-04-23 DIAGNOSIS — Z794 Long term (current) use of insulin: Secondary | ICD-10-CM | POA: Diagnosis not present

## 2022-04-23 NOTE — Telephone Encounter (Signed)
Prior authorization was initiated via CoverMyMeds for prescription Repatha 140 MG. Patient was approved via insurance.   [id number]

## 2022-04-27 ENCOUNTER — Ambulatory Visit (INDEPENDENT_AMBULATORY_CARE_PROVIDER_SITE_OTHER): Payer: Medicare HMO

## 2022-04-27 DIAGNOSIS — N183 Chronic kidney disease, stage 3 unspecified: Secondary | ICD-10-CM

## 2022-04-27 DIAGNOSIS — I152 Hypertension secondary to endocrine disorders: Secondary | ICD-10-CM

## 2022-04-27 DIAGNOSIS — Z794 Long term (current) use of insulin: Secondary | ICD-10-CM

## 2022-04-27 NOTE — Patient Instructions (Signed)
Visit Information   Goals Addressed   None    Patient Care Plan: CCM Pharmacy [facility] Plan     Problem Identified: Disease State Management   Priority: High  Onset Date: 04/27/2022     Long-Range Goal: DM, Lipids, HTN   Start Date: 04/27/2022  This Visit's Progress: On track  Priority: High  Note:   Current Barriers:  Does not contact provider office for questions/concerns  Pharmacist Clinical Goal(s):  Patient will contact provider office for questions/concerns as evidenced notation of same in electronic health record through collaboration with PharmD and provider.   Interventions: 1:1 collaboration with [name], NP regarding development and update of comprehensive plan of care as evidenced by provider attestation and co-signature Inter-disciplinary care team collaboration (see longitudinal plan of care) Comprehensive medication review performed; medication list updated in electronic medical record  Hypertension (BP goal <140/90) BP Readings from Last 3 Encounters:  04/09/22 120/72  10/10/21 126/82  07/11/21 132/68  -Controlled -Current treatment: Atenolol Appropriate, Effective, Safe, Accessible Ramipril 1.59m Appropriate, Effective, Safe, Accessible -Medications previously tried: N/A  -Current home readings: doesn't test -Current dietary habits: "Tries to eat healthy" -Current exercise habits: N/A -Denies hypotensive/hypertensive symptoms -Educated on BP goals and benefits of medications for prevention of heart attack, stroke and kidney damage; -Counseled to monitor BP at home prn, document, and provide log at future appointments -Recommended to continue current medication  Hyperlipidemia: (LDL goal < 100) The 10-year ASCVD risk score (Arnett DK, et al., 2019) is: 56.4%   Values used to calculate the score:     Age: [age]years     Sex: Male     Is Non-Hispanic African American: No     Diabetic: Yes     Tobacco smoker: No     Systolic Blood Pressure: 1510 mmHg     Is BP treated: Yes     HDL Cholesterol: 26 mg/dL     Total Cholesterol: 186 mg/dL Lab Results  Component Value Date   CHOL 186 04/17/2022   CHOL 207 (H) 04/09/2022   CHOL 178 10/10/2021   Lab Results  Component Value Date   HDL 26 (L) 04/17/2022   HDL 20 (L) 04/09/2022   HDL 33 (L) 10/10/2021   Lab Results  Component Value Date   LDLCALC Comment (A) 04/17/2022   LDLCALC Comment (A) 04/09/2022   LDLCALC 72 10/10/2021   Lab Results  Component Value Date   TRIG 1,040 (HH) 04/17/2022   TRIG 1,600 (HH) 04/09/2022   TRIG 467 (H) 10/10/2021  No results found for: "CHOLHDL" No results found for: "LDLDIRECT" -Uncontrolled -Current treatment: Repatha (Hasn't started yet) Query Appropriate,  Fenofibrate Appropriate, Query effective,  -Medications previously tried: Rosuvastatin -Current dietary patterns: "Tries to eat healthy" -Current exercise habits: N/A -Educated on Cholesterol goals;  August 2023: Will ask PCP why she stopped rosuvastatin. Patient Repatha approved this week per patient  Diabetes (A1c goal <8%) Lab Results  Component Value Date   HGBA1C 7.2 11/06/2019   Lab Results  Component Value Date   MICROALBUR 150 (H) 10/10/2021   LDLCALC Comment (A) 04/17/2022   CREATININE 1.17 04/17/2022   Lab Results  Component Value Date   NA 136 04/17/2022   K 4.8 04/17/2022   CREATININE 1.17 04/17/2022   EGFR 66 04/17/2022   GFRNONAA 64 09/03/2020   GLUCOSE 269 (H) 04/17/2022   Lab Results  Component Value Date   WBC 4.6 03/14/2021   HGB 14.4 03/14/2021   HCT 43.7 03/14/2021  MCV 87 03/14/2021   PLT 215 03/14/2021   Lab Results  Component Value Date   LABMICR See below: 11/19/2015   MICROALBUR 150 (H) 10/10/2021   MICROALBUR 30 (H) 12/10/2020   -Uncontrolled -Current medications: Glimepiride Query Appropriate,  Tresiba 70 units Query Appropriate,  Wt Readings from Last 3 Encounters:  04/09/22 177 lb 6.4 oz (80.5 kg)  10/10/21 181 lb (82.1 kg)   07/11/21 178 lb 6.4 oz (80.9 kg)   Metformin Appropriate, Query effective,  -Medications previously tried: N/A  -Current home glucose readings fasting glucose:  Checks sugars every morning, didn't have list on him -Denies hypoglycemic/hyperglycemic symptoms -Current exercise: N/A -Educated on Complications of diabetes including kidney damage, retinal damage, and cardiovascular disease; -Counseled to check feet daily and get yearly eye exams August 2023: Spoke with Santiago Glad at [facility]. Asked her to pass on if we could decrease [place] to 0.5units/kg/day, and replace Glimepiride with short term insulin. Also, endo note said patient on Jardiance but isn't on medslist and patient states he isn't taking it, let Santiago Glad know  Insomnia (Goal: 7-8 hours of sleep/night) -Uncontrolled -Patient currently getting (patient didn't answer) hours of sleep/night -Patient currently waking up (patient didn't answer) times/night -Patient's concern is Both falling/staying alseep -Current treatment: None -Medications previously tried: Suvorexant (Can't afford) -Counseled on sleep hygiene techniques (consistent sleep/wake up schedule, no electronic screens 1-2 hours before bed, etc) August 2023: PCP prescribed Suvorexant but is >$100, will let PCP know   Patient Goals/Self-Care Activities Patient will:  - take medications as prescribed as evidenced by patient report and record review  Follow Up Plan: The patient has been provided with contact information for the care management team and has been advised to call with any health related questions or concerns.   CPP F/U December   [place], [place].D. - [phone]       Mr. Ratay was given information about Chronic Care Management services today including:  CCM service includes personalized support from designated clinical staff supervised by his physician, including individualized plan of care and coordination with other care providers 24/7 contact  phone numbers for assistance for urgent and routine care needs. Standard insurance, coinsurance, copays and deductibles apply for chronic care management only during months in which we provide at least 20 minutes of these services. Most insurances cover these services at 100%, however patients may be responsible for any copay, coinsurance and/or deductible if applicable. This service may help you avoid the need for more expensive face-to-face services. Only one practitioner may furnish and bill the service in a calendar month. The patient may stop CCM services at any time (effective at the end of the month) by phone call to the office staff.  Patient agreed to services and verbal consent obtained.   The patient verbalized understanding of instructions, educational materials, and care plan provided today and DECLINED offer to receive copy of patient instructions, educational materials, and care plan.  The pharmacy team will reach out to the patient again over the next 60 days.   Lane Hacker, [place]

## 2022-04-27 NOTE — Progress Notes (Signed)
Chronic Care Management Pharmacy Note  04/27/2022 Name:  [name] MRN:  [id number] DOB:  Dec 15, 1950   Recommendations/Changes made from today's visit: -Coordinated with Specialist regarding MDD Basal insulin -Patient was stopped on Rosuvastatin. Unsure as to why, couldn't find it in note   Subjective: [name] is an 71 y.o. year old male who is a primary patient of Cannady, Barbaraann Faster, NP.  The CCM team was consulted for assistance with disease management and care coordination needs.    Engaged with patient by telephone for follow up visit in response to provider referral for pharmacy case management and/or care coordination services.   Consent to Services:  The patient was given the following information about Chronic Care Management services today, agreed to services, and gave verbal consent: 1. CCM service includes personalized support from designated clinical staff supervised by the primary care provider, including individualized plan of care and coordination with other care providers 2. 24/7 contact phone numbers for assistance for urgent and routine care needs. 3. Service will only be billed when office clinical staff spend 20 minutes or more in a month to coordinate care. 4. Only one practitioner may furnish and bill the service in a calendar month. 5.The patient may stop CCM services at any time (effective at the end of the month) by phone call to the office staff. 6. The patient will be responsible for cost sharing (co-pay) of up to 20% of the service fee (after annual deductible is met). Patient agreed to services and consent obtained.  Patient Care Team: [name], NP as PCP - General (Nurse Practitioner) Lane Hacker, [facility] (Pharmacist)    Objective:  Lab Results  Component Value Date   CREATININE 1.17 04/17/2022   BUN 14 04/17/2022   EGFR 66 04/17/2022   GFRNONAA 64 09/03/2020   GFRAA 74 09/03/2020   NA 136 04/17/2022   K 4.8 04/17/2022    CALCIUM 9.1 04/17/2022   CO2 17 (L) 04/17/2022   GLUCOSE 269 (H) 04/17/2022    Lab Results  Component Value Date/Time   HGBA1C 7.2 11/06/2019 12:00 AM   MICROALBUR 150 (H) 10/10/2021 10:25 AM   MICROALBUR 30 (H) 12/10/2020 01:57 PM    Last diabetic Eye exam: No results found for: "HMDIABEYEEXA"  Last diabetic Foot exam: No results found for: "HMDIABFOOTEX"   Lab Results  Component Value Date   CHOL 186 04/17/2022   HDL 26 (L) 04/17/2022   LDLCALC Comment (A) 04/17/2022   TRIG 1,040 (HH) 04/17/2022       Latest Ref Rng & Units 10/10/2021   10:27 AM 07/11/2021   11:07 AM 12/10/2020    2:04 PM  Hepatic Function  Total Protein 6.0 - 8.5 g/dL 6.5  6.1  6.6   Albumin 3.7 - 4.7 g/dL 4.5  4.2  4.6   AST 0 - 40 IU/L '13  12  18   ' ALT 0 - 44 IU/L '14  11  15   ' Alk Phosphatase 44 - 121 IU/L 64  65  66   Total Bilirubin 0.0 - 1.2 mg/dL 0.3  0.3  0.3     Lab Results  Component Value Date/Time   TSH 0.803 03/14/2021 09:13 AM   TSH 2.150 02/28/2020 11:55 AM       Latest Ref Rng & Units 03/14/2021    9:13 AM 06/04/2020    9:23 AM 02/10/2020    2:29 PM  CBC  WBC 3.4 - 10.8 x10E3/uL 4.6  4.4  8.3   Hemoglobin 13.0 - 17.7 g/dL 14.4  14.2  12.9   Hematocrit 37.5 - 51.0 % 43.7  42.2  37.5   Platelets 150 - 450 x10E3/uL 215  172  215     No results found for: "VD25OH"  Clinical ASCVD: Yes  The 10-year ASCVD risk score (Arnett DK, et al., 2019) is: 56.4%   Values used to calculate the score:     Age: 61 years     Sex: Male     Is Non-Hispanic African American: No     Diabetic: Yes     Tobacco smoker: No     Systolic Blood Pressure: 935 mmHg     Is BP treated: Yes     HDL Cholesterol: 26 mg/dL     Total Cholesterol: 186 mg/dL       04/09/2022    9:42 AM 10/10/2021   10:24 AM 05/16/2021    9:07 AM  Depression screen PHQ 2/9  Decreased Interest 0 0 0  Down, Depressed, Hopeless 0 0 0  PHQ - 2 Score 0 0 0  Altered sleeping 2 0   Tired, decreased energy 1 0   Change in appetite  0 0   Feeling bad or failure about yourself  0 0   Trouble concentrating 0 0   Moving slowly or fidgety/restless 0 0   Suicidal thoughts 0 0   PHQ-9 Score 3 0   Difficult doing work/chores Not difficult at all       Other: (CHADS2VASc if Afib, MMRC or CAT for COPD, ACT, DEXA)  Social History   Tobacco Use  Smoking Status Former   Types: Cigarettes   Quit date: 1998   Years since quitting: 25.6  Smokeless Tobacco Never   BP Readings from Last 3 Encounters:  04/09/22 120/72  10/10/21 126/82  07/11/21 132/68   Pulse Readings from Last 3 Encounters:  04/09/22 (!) 57  10/10/21 (!) 57  07/11/21 69   Wt Readings from Last 3 Encounters:  04/09/22 177 lb 6.4 oz (80.5 kg)  10/10/21 181 lb (82.1 kg)  07/11/21 178 lb 6.4 oz (80.9 kg)   BMI Readings from Last 3 Encounters:  04/09/22 24.06 kg/m  10/10/21 24.55 kg/m  07/11/21 24.20 kg/m    Assessment/Interventions: Review of patient past medical history, allergies, medications, health status, including review of consultants reports, laboratory and other test data, was performed as part of comprehensive evaluation and provision of chronic care management services.   SDOH:  (Social Determinants of Health) assessments and interventions performed: Yes SDOH Interventions    Flowsheet Row Most Recent Value  SDOH Interventions   Financial Strain Interventions Other (Comment)  [PAP]  Transportation Interventions Intervention Not Indicated      SDOH Screenings   Alcohol Screen: Not on file  Depression (PHQ2-9): Low Risk  (04/09/2022)   Depression (PHQ2-9)    PHQ-2 Score: 3  Financial Resource Strain: High Risk (04/27/2022)   Overall Financial Resource Strain (CARDIA)    Difficulty of Paying Living Expenses: Hard  Food Insecurity: No Food Insecurity (05/16/2021)   Hunger Vital Sign    Worried About Running Out of Food in the Last Year: Never true    Ran Out of Food in the Last Year: Never true  Housing: Low Risk  (01/05/2020)    Housing    Last Housing Risk Score: 0  Physical Activity: Inactive (05/16/2021)   Exercise Vital Sign    Days of Exercise per Week: 0 days  Minutes of Exercise per Session: 0 min  Social Connections: Socially Integrated (01/05/2020)   Social Connection and Isolation Panel [NHANES]    Frequency of Communication with Friends and Family: Three times a week    Frequency of Social Gatherings with Friends and Family: Three times a week    Attends Religious Services: More than 4 times per year    Active Member of Clubs or Organizations: Yes    Attends Archivist Meetings: Never    Marital Status: Married  Stress: No Stress Concern Present (05/16/2021)   [place]    Feeling of Stress : Not at all  Tobacco Use: Medium Risk (04/09/2022)   Patient History    Smoking Tobacco Use: Former    Smokeless Tobacco Use: Never    Passive Exposure: Not on file  Transportation Needs: No Transportation Needs (04/27/2022)   PRAPARE - Hydrologist (Medical): No    Lack of Transportation (Non-Medical): No    CCM Care Plan  Allergies  Allergen Reactions   Trulicity [Dulaglutide] Nausea Only   Penicillins Rash    Childhood. Did not require medical care. Childhood. Did not require medical care. Childhood. Did not require medical care.    Medications Reviewed Today     Reviewed by Lane Hacker, [facility] (Pharmacist) on 04/27/22 at 1609  Med List Status: <None>   Medication Order Taking? Sig Documenting Provider Last Dose Status Informant  ACCU-CHEK GUIDE test strip [id number]  1 each by Other route 2 times daily at 12 noon and 4 pm. [provider]  Active   amitriptyline (ELAVIL) 10 MG tablet [id number]  Take by mouth. [provider]  Active   aspirin EC 81 MG tablet [id number] Yes Take 81 mg by mouth daily.  [provider] Taking Active            Med Note Rosemarie Beath,  MELISSA B   Mon Jun 27, 2018  4:52 PM)    atenolol (TENORMIN) 50 MG tablet [id number] Yes Take 1 tablet (50 mg total) by mouth daily. [name] T, NP Taking Active   clopidogrel (PLAVIX) 75 MG tablet [id number] Yes Take 1 tablet (75 mg total) by mouth daily. [name] T, NP Taking Active   Evolocumab (REPATHA) 140 MG/ML SOSY [id number] No Inject 140 mg into the skin every 14 (fourteen) days.  Patient not taking: Reported on 04/27/2022   [name] T, NP Not Taking Active   fenofibrate (TRICOR) 145 MG tablet [id number] Yes TAKE 1 TABLET (145 MG TOTAL) BY MOUTH DAILY. [name] T, NP Taking Active   glimepiride (AMARYL) 4 MG tablet [id number]  Take by mouth. [provider]  Active   insulin degludec (TRESIBA FLEXTOUCH) 100 UNIT/ML FlexTouch Pen [id number]  Inject 70 Units into the skin daily.  [provider]  Active   metFORMIN (GLUCOPHAGE) 500 MG tablet [id number] Yes TAKE 2 TABLETS TWICE DAILY WITH MEALS Cannady, Jolene T, NP Taking Active   neomycin-polymyxin-dexamethasone (MAXITROL) 0.1 % ophthalmic suspension [id number]  Place 1 drop into the left eye 4 (four) times daily. [name] T, NP  Active   nitroGLYCERIN (NITROSTAT) 0.4 MG SL tablet [id number]  Place 0.4 mg under the tongue every 5 (five) minutes as needed. [provider]  Active   omega-3 acid ethyl esters (LOVAZA) 1 g capsule [id number]  Take 2 capsules (2 g total) by mouth 2 (two) times daily. Eulogio Bear, NP  Active   pantoprazole (PROTONIX) 20 MG tablet [id number] Yes Take 1 tablet (20 mg total) by mouth daily. [name] T, NP Taking Active   ramipril (ALTACE) 1.25 MG capsule [id number] Yes TAKE 1 CAPSULE (1.25 MG TOTAL) BY MOUTH DAILY. [name] T, NP Taking Active   Suvorexant (BELSOMRA) 10 MG TABS [id number]  Take 10 mg by mouth at bedtime as needed. [name] T, NP  Active   vitamin B-12 (CYANOCOBALAMIN) 1000 MCG tablet [id number]  Take 1,000 mcg by mouth daily.  [provider]  Active   Zoster Vaccine Adjuvanted [facility]) injection [id number]  Inject 0.5 mLs into the muscle once for 1 dose. Dose # 2 [name] T, NP  Active             Patient Active Problem List   Diagnosis Date Noted   Type 2 diabetes mellitus with proteinuria ([place]) 10/10/2021   Hypomagnesemia 10/05/2021   Hypertriglyceridemia 12/11/2020   Irritable bowel syndrome 12/10/2020   Vitamin B12 deficiency 02/29/2020   CKD stage 3 due to type 2 diabetes mellitus ([place]) 02/18/2020   Erectile dysfunction 01/05/2020   History of hip replacement 07/25/2019   Hyperlipidemia associated with type 2 diabetes mellitus ([place]) 05/24/2018   Hypertension associated with type 2 diabetes mellitus ([place]) 05/24/2018   Type 2 diabetes mellitus with hyperglycemia, with long-term current use of insulin ([place]) 05/24/2018   Benign fibroma of prostate 03/05/2014   BPH (benign prostatic hyperplasia) 03/05/2014   Insomnia 03/05/2014   Arteriosclerosis of coronary artery 04/12/2012   Spinal stenosis 04/12/2012   ASHD (arteriosclerotic heart disease) 04/12/2012   GERD (gastroesophageal reflux disease) 04/12/2012   Eye globe prosthesis 04/12/2012    Immunization History  Administered Date(s) Administered   Fluad Quad(high Dose 65+) 08/14/2019, 06/04/2020, 07/11/2021   Influenza, High Dose Seasonal PF 05/31/2015, 06/16/2017, 07/12/2018   Influenza-Unspecified 06/20/2014, 06/26/2015, 08/27/2016, 09/11/2016   PFIZER Comirnaty(Gray Top)Covid-19 Tri-Sucrose Vaccine 11/04/2019, 11/26/2019   PFIZER(Purple Top)SARS-COV-2 Vaccination 11/04/2019, 11/26/2019   Pneumococcal Conjugate-13 06/20/2014   Pneumococcal Polysaccharide-23 06/21/2015, 06/25/2015   Td 09/08/2008   Tdap 05/30/2012, 06/16/2018    Conditions to be addressed/monitored:  Hypertension, Hyperlipidemia, and Diabetes  Care Plan : CCM Pharmacy [place] Plan  Updates made by Lane Hacker, [place] since 04/27/2022 12:00 AM      Problem: Disease State Management   Priority: High  Onset Date: 04/27/2022     Long-Range Goal: DM, Lipids, HTN   Start Date: 04/27/2022  This Visit's Progress: On track  Priority: High  Note:   Current Barriers:  Does not contact provider office for questions/concerns  Pharmacist Clinical Goal(s):  Patient will contact provider office for questions/concerns as evidenced notation of same in electronic health record through collaboration with PharmD and provider.   Interventions: 1:1 collaboration with [name], NP regarding development and update of comprehensive plan of care as evidenced by provider attestation and co-signature Inter-disciplinary care team collaboration (see longitudinal plan of care) Comprehensive medication review performed; medication list updated in electronic medical record  Hypertension (BP goal <140/90) BP Readings from Last 3 Encounters:  04/09/22 120/72  10/10/21 126/82  07/11/21 132/68  -Controlled -Current treatment: Atenolol Appropriate, Effective, Safe, Accessible Ramipril 1.79m Appropriate, Effective, Safe, Accessible -Medications previously tried: N/A  -Current home readings: doesn't test -Current dietary habits: "Tries to eat healthy" -Current exercise habits: N/A -Denies hypotensive/hypertensive symptoms -Educated on BP goals and benefits of medications for prevention of heart attack, stroke and kidney damage; -Counseled to monitor BP at home prn,  document, and provide log at future appointments -Recommended to continue current medication  Hyperlipidemia: (LDL goal < 100) The 10-year ASCVD risk score (Arnett DK, et al., 2019) is: 56.4%   Values used to calculate the score:     Age: 46 years     Sex: Male     Is Non-Hispanic African American: No     Diabetic: Yes     Tobacco smoker: No     Systolic Blood Pressure: 174 mmHg     Is BP treated: Yes     HDL Cholesterol: 26 mg/dL     Total Cholesterol: 186 mg/dL Lab Results   Component Value Date   CHOL 186 04/17/2022   CHOL 207 (H) 04/09/2022   CHOL 178 10/10/2021   Lab Results  Component Value Date   HDL 26 (L) 04/17/2022   HDL 20 (L) 04/09/2022   HDL 33 (L) 10/10/2021   Lab Results  Component Value Date   LDLCALC Comment (A) 04/17/2022   LDLCALC Comment (A) 04/09/2022   LDLCALC 72 10/10/2021   Lab Results  Component Value Date   TRIG 1,040 ([place]) 04/17/2022   TRIG 1,600 ([place]) 04/09/2022   TRIG 467 (H) 10/10/2021  No results found for: "CHOLHDL" No results found for: "LDLDIRECT" -Uncontrolled -Current treatment: Repatha (Hasn't started yet) Query Appropriate,  Fenofibrate Appropriate, Query effective,  -Medications previously tried: Rosuvastatin -Current dietary patterns: "Tries to eat healthy" -Current exercise habits: N/A -Educated on Cholesterol goals;  August 2023: Will ask PCP why she stopped rosuvastatin. Patient Repatha approved this week per patient  Diabetes (A1c goal <8%) Lab Results  Component Value Date   HGBA1C 7.2 11/06/2019   Lab Results  Component Value Date   MICROALBUR 150 (H) 10/10/2021   LDLCALC Comment (A) 04/17/2022   CREATININE 1.17 04/17/2022   Lab Results  Component Value Date   NA 136 04/17/2022   K 4.8 04/17/2022   CREATININE 1.17 04/17/2022   EGFR 66 04/17/2022   GFRNONAA 64 09/03/2020   GLUCOSE 269 (H) 04/17/2022   Lab Results  Component Value Date   WBC 4.6 03/14/2021   HGB 14.4 03/14/2021   HCT 43.7 03/14/2021   MCV 87 03/14/2021   PLT 215 03/14/2021   Lab Results  Component Value Date   LABMICR See below: 11/19/2015   MICROALBUR 150 (H) 10/10/2021   MICROALBUR 30 (H) 12/10/2020   -Uncontrolled -Current medications: Glimepiride Query Appropriate,  Tresiba 70 units Query Appropriate,  Wt Readings from Last 3 Encounters:  04/09/22 177 lb 6.4 oz (80.5 kg)  10/10/21 181 lb (82.1 kg)  07/11/21 178 lb 6.4 oz (80.9 kg)   Metformin Appropriate, Query effective,  -Medications previously  tried: N/A  -Current home glucose readings fasting glucose:  Checks sugars every morning, didn't have list on him -Denies hypoglycemic/hyperglycemic symptoms -Current exercise: N/A -Educated on Complications of diabetes including kidney damage, retinal damage, and cardiovascular disease; -Counseled to check feet daily and get yearly eye exams August 2023: Spoke with Santiago Glad at [facility]. Asked her to pass on if we could decrease [place] to 0.5units/kg/day, and replace Glimepiride with short term insulin. Also, endo note said patient on Jardiance but isn't on medslist and patient states he isn't taking it, let Santiago Glad know  Insomnia (Goal: 7-8 hours of sleep/night) -Uncontrolled -Patient currently getting (patient didn't answer) hours of sleep/night -Patient currently waking up (patient didn't answer) times/night -Patient's concern is Both falling/staying alseep -Current treatment: None -Medications previously tried: Suvorexant (Can't afford) -Counseled on sleep hygiene techniques (  consistent sleep/wake up schedule, no electronic screens 1-2 hours before bed, etc) August 2023: PCP prescribed Suvorexant but is >$100, will let PCP know   Patient Goals/Self-Care Activities Patient will:  - take medications as prescribed as evidenced by patient report and record review  Follow Up Plan: The patient has been provided with contact information for the care management team and has been advised to call with any health related questions or concerns.   CPP F/U December   [place] Constable, [place].D. - [phone]        Medication Assistance: None required.  Patient affirms current coverage meets needs.  Compliance/Adherence/Medication fill history:  Patient's preferred pharmacy is:  [facility] [place], [address] [place] [address] Phone: [phone] Fax: [phone]  [facility] [phone], [place] [address] [place] [address] Phone: [phone] Fax: [phone]  Uses pill box? No -   Pt endorses 100% compliance  We discussed: Current pharmacy is preferred with insurance plan and patient is satisfied with pharmacy services Patient decided to: Continue current medication management strategy  Care Plan and Follow Up Patient Decision:  Patient agrees to Care Plan and Follow-up.  Plan: The patient has been provided with contact information for the care management team and has been advised to call with any health related questions or concerns.   [place] Constable, Pharm.D. - [phone]

## 2022-05-04 ENCOUNTER — Encounter: Payer: Self-pay | Admitting: Nurse Practitioner

## 2022-05-06 NOTE — Telephone Encounter (Signed)
Hi Tracy,  Can you start the PAP process for this patient, read below for med/dosage.

## 2022-05-06 NOTE — Telephone Encounter (Signed)
[name] patient

## 2022-05-13 ENCOUNTER — Telehealth: Payer: Self-pay | Admitting: Nurse Practitioner

## 2022-05-13 NOTE — Telephone Encounter (Signed)
Patient brought in completed  patient assistance application.Application was placed in the providers folder for signature.

## 2022-05-14 DIAGNOSIS — E1159 Type 2 diabetes mellitus with other circulatory complications: Secondary | ICD-10-CM

## 2022-05-14 DIAGNOSIS — E1165 Type 2 diabetes mellitus with hyperglycemia: Secondary | ICD-10-CM

## 2022-05-14 DIAGNOSIS — Z794 Long term (current) use of insulin: Secondary | ICD-10-CM

## 2022-05-14 DIAGNOSIS — E1122 Type 2 diabetes mellitus with diabetic chronic kidney disease: Secondary | ICD-10-CM

## 2022-05-14 DIAGNOSIS — N183 Chronic kidney disease, stage 3 unspecified: Secondary | ICD-10-CM

## 2022-05-14 DIAGNOSIS — I152 Hypertension secondary to endocrine disorders: Secondary | ICD-10-CM

## 2022-05-14 NOTE — Telephone Encounter (Signed)
Noted, thank you

## 2022-05-14 NOTE — Telephone Encounter (Signed)
Paperwork faxed and placed in Pharmacist folder for further review.

## 2022-05-15 ENCOUNTER — Telehealth: Payer: Self-pay | Admitting: Nurse Practitioner

## 2022-05-15 NOTE — Telephone Encounter (Signed)
[name] is calling from Dr. Sherren Mocha office is calling for [name], Pharm.D. to report that the pt will coming in to follow up. Will discuss with pt. CB- [phone]

## 2022-05-17 NOTE — Patient Instructions (Signed)
Insomnia Insomnia is a sleep disorder that makes it difficult to fall asleep or stay asleep. Insomnia can cause fatigue, low energy, difficulty concentrating, mood swings, and poor performance at work or school. There are three different ways to classify insomnia: Difficulty falling asleep. Difficulty staying asleep. Waking up too early in the morning. Any type of insomnia can be long-term (chronic) or short-term (acute). Both are common. Short-term insomnia usually lasts for 3 months or less. Chronic insomnia occurs at least three times a week for longer than 3 months. What are the causes? Insomnia may be caused by another condition, situation, or substance, such as: Having certain mental health conditions, such as anxiety and depression. Using caffeine, alcohol, tobacco, or drugs. Having gastrointestinal conditions, such as gastroesophageal reflux disease (GERD). Having certain medical conditions. These include: Asthma. Alzheimer's disease. Stroke. Chronic pain. An overactive thyroid gland (hyperthyroidism). Other sleep disorders, such as restless legs syndrome and sleep apnea. Menopause. Sometimes, the cause of insomnia may not be known. What increases the risk? Risk factors for insomnia include: Gender. Females are affected more often than males. Age. Insomnia is more common as people get older. Stress and certain medical and mental health conditions. Lack of exercise. Having an irregular work schedule. This may include working night shifts and traveling between different time zones. What are the signs or symptoms? If you have insomnia, the main symptom is having trouble falling asleep or having trouble staying asleep. This may lead to other symptoms, such as: Feeling tired or having low energy. Feeling nervous about going to sleep. Not feeling rested in the morning. Having trouble concentrating. Feeling irritable, anxious, or depressed. How is this diagnosed? This condition  may be diagnosed based on: Your symptoms and medical history. Your health care provider may ask about: Your sleep habits. Any medical conditions you have. Your mental health. A physical exam. How is this treated? Treatment for insomnia depends on the cause. Treatment may focus on treating an underlying condition that is causing the insomnia. Treatment may also include: Medicines to help you sleep. Counseling or therapy. Lifestyle adjustments to help you sleep better. Follow these instructions at home: Eating and drinking  Limit or avoid alcohol, caffeinated beverages, and products that contain nicotine and tobacco, especially close to bedtime. These can disrupt your sleep. Do not eat a large meal or eat spicy foods right before bedtime. This can lead to digestive discomfort that can make it hard for you to sleep. Sleep habits  Keep a sleep diary to help you and your health care provider figure out what could be causing your insomnia. Write down: When you sleep. When you wake up during the night. How well you sleep and how rested you feel the next day. Any side effects of medicines you are taking. What you eat and drink. Make your bedroom a dark, comfortable place where it is easy to fall asleep. Put up shades or blackout curtains to block light from outside. Use a white noise machine to block noise. Keep the temperature cool. Limit screen use before bedtime. This includes: Not watching TV. Not using your smartphone, tablet, or computer. Stick to a routine that includes going to bed and waking up at the same times every day and night. This can help you fall asleep faster. Consider making a quiet activity, such as reading, part of your nighttime routine. Try to avoid taking naps during the day so that you sleep better at night. Get out of bed if you are still awake after   15 minutes of trying to sleep. Keep the lights down, but try reading or doing a quiet activity. When you feel  sleepy, go back to bed. General instructions Take over-the-counter and prescription medicines only as told by your health care provider. Exercise regularly as told by your health care provider. However, avoid exercising in the hours right before bedtime. Use relaxation techniques to manage stress. Ask your health care provider to suggest some techniques that may work well for you. These may include: Breathing exercises. Routines to release muscle tension. Visualizing peaceful scenes. Make sure that you drive carefully. Do not drive if you feel very sleepy. Keep all follow-up visits. This is important. Contact a health care provider if: You are tired throughout the day. You have trouble in your daily routine due to sleepiness. You continue to have sleep problems, or your sleep problems get worse. Get help right away if: You have thoughts about hurting yourself or someone else. Get help right away if you feel like you may hurt yourself or others, or have thoughts about taking your own life. Go to your nearest emergency room or: Call 911. Call the National Suicide Prevention Lifeline at 1-800-273-8255 or 988. This is open 24 hours a day. Text the Crisis Text Line at 741741. Summary Insomnia is a sleep disorder that makes it difficult to fall asleep or stay asleep. Insomnia can be long-term (chronic) or short-term (acute). Treatment for insomnia depends on the cause. Treatment may focus on treating an underlying condition that is causing the insomnia. Keep a sleep diary to help you and your health care provider figure out what could be causing your insomnia. This information is not intended to replace advice given to you by your health care provider. Make sure you discuss any questions you have with your health care provider. Document Revised: 08/11/2021 Document Reviewed: 08/11/2021 Elsevier Patient Education  2023 [employer]  

## 2022-05-19 ENCOUNTER — Ambulatory Visit (INDEPENDENT_AMBULATORY_CARE_PROVIDER_SITE_OTHER): Payer: Medicare HMO | Admitting: *Deleted

## 2022-05-19 DIAGNOSIS — Z0000 Encounter for general adult medical examination without abnormal findings: Secondary | ICD-10-CM

## 2022-05-19 NOTE — Patient Instructions (Signed)
[name] , Thank you for taking time to come for your Medicare Wellness Visit. I appreciate your ongoing commitment to your health goals. Please review the following plan we discussed and let me know if I can assist you in the future.   Screening recommendations/referrals: Colonoscopy: up to date Recommended yearly ophthalmology/optometry visit for glaucoma screening and checkup Recommended yearly dental visit for hygiene and checkup  Vaccinations: Influenza vaccine: up to date Pneumococcal vaccine: up to date Tdap vaccine: up to date Shingles vaccine: Education provided    Advanced directives: Education provided  Conditions/risks identified:     Preventive Care 31 Years and Older, Male Preventive care refers to lifestyle choices and visits with your health care provider that can promote health and wellness. What does preventive care include? A yearly physical exam. This is also called an annual well check. Dental exams once or twice a year. Routine eye exams. Ask your health care provider how often you should have your eyes checked. Personal lifestyle choices, including: Daily care of your teeth and gums. Regular physical activity. Eating a healthy diet. Avoiding tobacco and drug use. Limiting alcohol use. Practicing safe sex. Taking low doses of aspirin every day. Taking vitamin and mineral supplements as recommended by your health care provider. What happens during an annual well check? The services and screenings done by your health care provider during your annual well check will depend on your age, overall health, lifestyle risk factors, and family history of disease. Counseling  Your health care provider may ask you questions about your: Alcohol use. Tobacco use. Drug use. Emotional well-being. Home and relationship well-being. Sexual activity. Eating habits. History of falls. Memory and ability to understand (cognition). Work and work Statistician. Screening   You may have the following tests or measurements: Height, weight, and BMI. Blood pressure. Lipid and cholesterol levels. These may be checked every 5 years, or more frequently if you are over 88 years old. Skin check. Lung cancer screening. You may have this screening every year starting at age 25 if you have a 30-pack-year history of smoking and currently smoke or have quit within the past 15 years. Fecal occult blood test (FOBT) of the stool. You may have this test every year starting at age 90. Flexible sigmoidoscopy or colonoscopy. You may have a sigmoidoscopy every 5 years or a colonoscopy every 10 years starting at age 76. Prostate cancer screening. Recommendations will vary depending on your family history and other risks. Hepatitis C blood test. Hepatitis B blood test. Sexually transmitted disease (STD) testing. Diabetes screening. This is done by checking your blood sugar (glucose) after you have not eaten for a while (fasting). You may have this done every 1-3 years. Abdominal aortic aneurysm (AAA) screening. You may need this if you are a current or former smoker. Osteoporosis. You may be screened starting at age 64 if you are at high risk. Talk with your health care provider about your test results, treatment options, and if necessary, the need for more tests. Vaccines  Your health care provider may recommend certain vaccines, such as: Influenza vaccine. This is recommended every year. Tetanus, diphtheria, and acellular pertussis (Tdap, Td) vaccine. You may need a Td booster every 10 years. Zoster vaccine. You may need this after age 32. Pneumococcal 13-valent conjugate (PCV13) vaccine. One dose is recommended after age 30. Pneumococcal polysaccharide (PPSV23) vaccine. One dose is recommended after age 95. Talk to your health care provider about which screenings and vaccines you need and how often  you need them. This information is not intended to replace advice given to you by  your health care provider. Make sure you discuss any questions you have with your health care provider. Document Released: 09/27/2015 Document Revised: 05/20/2016 Document Reviewed: 07/02/2015 Elsevier Interactive Patient Education  2017 [employer] Prevention in the Home Falls can cause injuries. They can happen to people of all ages. There are many things you can do to make your home safe and to help prevent falls. What can I do on the outside of my home? Regularly fix the edges of walkways and driveways and fix any cracks. Remove anything that might make you trip as you walk through a door, such as a raised step or threshold. Trim any bushes or trees on the path to your home. Use bright outdoor lighting. Clear any walking paths of anything that might make someone trip, such as rocks or tools. Regularly check to see if handrails are loose or broken. Make sure that both sides of any steps have handrails. Any raised decks and porches should have guardrails on the edges. Have any leaves, snow, or ice cleared regularly. Use sand or salt on walking paths during winter. Clean up any spills in your garage right away. This includes oil or grease spills. What can I do in the bathroom? Use night lights. Install grab bars by the toilet and in the tub and shower. Do not use towel bars as grab bars. Use non-skid mats or decals in the tub or shower. If you need to sit down in the shower, use a plastic, non-slip stool. Keep the floor dry. Clean up any water that spills on the floor as soon as it happens. Remove soap buildup in the tub or shower regularly. Attach bath mats securely with double-sided non-slip rug tape. Do not have throw rugs and other things on the floor that can make you trip. What can I do in the bedroom? Use night lights. Make sure that you have a light by your bed that is easy to reach. Do not use any sheets or blankets that are too big for your bed. They should not hang  down onto the floor. Have a firm chair that has side arms. You can use this for support while you get dressed. Do not have throw rugs and other things on the floor that can make you trip. What can I do in the kitchen? Clean up any spills right away. Avoid walking on wet floors. Keep items that you use a lot in easy-to-reach places. If you need to reach something above you, use a strong step stool that has a grab bar. Keep electrical cords out of the way. Do not use floor polish or wax that makes floors slippery. If you must use wax, use non-skid floor wax. Do not have throw rugs and other things on the floor that can make you trip. What can I do with my stairs? Do not leave any items on the stairs. Make sure that there are handrails on both sides of the stairs and use them. Fix handrails that are broken or loose. Make sure that handrails are as long as the stairways. Check any carpeting to make sure that it is firmly attached to the stairs. Fix any carpet that is loose or worn. Avoid having throw rugs at the top or bottom of the stairs. If you do have throw rugs, attach them to the floor with carpet tape. Make sure that you have a light  switch at the top of the stairs and the bottom of the stairs. If you do not have them, ask someone to add them for you. What else can I do to help prevent falls? Wear shoes that: Do not have high heels. Have rubber bottoms. Are comfortable and fit you well. Are closed at the toe. Do not wear sandals. If you use a stepladder: Make sure that it is fully opened. Do not climb a closed stepladder. Make sure that both sides of the stepladder are locked into place. Ask someone to hold it for you, if possible. Clearly mark and make sure that you can see: Any grab bars or handrails. First and last steps. Where the edge of each step is. Use tools that help you move around (mobility aids) if they are needed. These  include: Canes. Walkers. Scooters. Crutches. Turn on the lights when you go into a dark area. Replace any light bulbs as soon as they burn out. Set up your furniture so you have a clear path. Avoid moving your furniture around. If any of your floors are uneven, fix them. If there are any pets around you, be aware of where they are. Review your medicines with your doctor. Some medicines can make you feel dizzy. This can increase your chance of falling. Ask your doctor what other things that you can do to help prevent falls. This information is not intended to replace advice given to you by your health care provider. Make sure you discuss any questions you have with your health care provider. Document Released: 06/27/2009 Document Revised: 02/06/2016 Document Reviewed: 10/05/2014 Elsevier Interactive Patient Education  2017 [employer].

## 2022-05-19 NOTE — Progress Notes (Signed)
Subjective:   [name] is a 71 y.o. male who presents for Medicare Annual/Subsequent preventive examination.  I connected with  [name] on 05/19/22 by a telephone enabled telemedicine application and verified that I am speaking with the correct person using two identifiers.   I discussed the limitations of evaluation and management by telemedicine. The patient expressed understanding and agreed to proceed.  Patient location: home  Provider location:  Tele-health-home    Review of Systems     Cardiac Risk Factors include: diabetes mellitus;male gender;family history of premature cardiovascular disease;obesity (BMI >30kg/m2);hypertension     Objective:    Today's Vitals   There is no height or weight on file to calculate BMI.     05/19/2022    9:13 AM 05/16/2021    9:06 AM 05/13/2020    9:02 AM 02/10/2020   12:33 PM 02/07/2019    2:29 PM 06/01/2018    4:42 PM 02/22/2018    2:59 PM  Advanced Directives  Does Patient Have a Medical Advance Directive? No No No No No No No  Would patient like information on creating a medical advance directive? No - Patient declined     No - Patient declined No - Patient declined    Current Medications (verified) Outpatient Encounter Medications as of 05/19/2022  Medication Sig   ACCU-CHEK GUIDE test strip 1 each by Other route 2 times daily at 12 noon and 4 pm.   amitriptyline (ELAVIL) 10 MG tablet Take by mouth.   aspirin EC 81 MG tablet Take 81 mg by mouth daily.    atenolol (TENORMIN) 50 MG tablet Take 1 tablet (50 mg total) by mouth daily.   fenofibrate (TRICOR) 145 MG tablet TAKE 1 TABLET (145 MG TOTAL) BY MOUTH DAILY.   glimepiride (AMARYL) 4 MG tablet Take by mouth.   insulin degludec (TRESIBA FLEXTOUCH) 100 UNIT/ML FlexTouch Pen Inject 70 Units into the skin daily.    metFORMIN (GLUCOPHAGE) 500 MG tablet TAKE 2 TABLETS TWICE DAILY WITH MEALS   neomycin-polymyxin-dexamethasone (MAXITROL) 0.1 % ophthalmic suspension  Place 1 drop into the left eye 4 (four) times daily.   nitroGLYCERIN (NITROSTAT) 0.4 MG SL tablet Place 0.4 mg under the tongue every 5 (five) minutes as needed.   omega-3 acid ethyl esters (LOVAZA) 1 g capsule Take 2 capsules (2 g total) by mouth 2 (two) times daily.   pantoprazole (PROTONIX) 20 MG tablet Take 1 tablet (20 mg total) by mouth daily.   ramipril (ALTACE) 1.25 MG capsule TAKE 1 CAPSULE (1.25 MG TOTAL) BY MOUTH DAILY.   vitamin B-12 (CYANOCOBALAMIN) 1000 MCG tablet Take 1,000 mcg by mouth daily.   clopidogrel (PLAVIX) 75 MG tablet Take 1 tablet (75 mg total) by mouth daily. (Patient not taking: Reported on 05/19/2022)   Evolocumab (REPATHA) 140 MG/ML SOSY Inject 140 mg into the skin every 14 (fourteen) days. (Patient not taking: Reported on 04/27/2022)   [START ON 06/03/2022] Zoster Vaccine Adjuvanted [facility]) injection Inject 0.5 mLs into the muscle once for 1 dose. Dose # 2 (Patient not taking: Reported on 05/19/2022)   No facility-administered encounter medications on file as of 05/19/2022.    Allergies (verified) Trulicity [dulaglutide] and Penicillins   History: Past Medical History:  Diagnosis Date   Acid reflux 04/12/2012   Allergy    Arteriosclerosis of coronary artery 04/12/2012   Overview:  1.  Atherosclerotic coronary artery disease.    a.  normal LV function.  Coronary arteriography 5/98 revealed 50% LAD lesion,  95% lesion in large diagonal branch. Left circumflex was normal.  25% proximal RCA lesion.  50% lesion in the PDA.    b.  PTCA of the first diagonal and placement of three multi-link stents.    c.  recent ETT Cardiolite revealed low probability for ischemia.     d.  cardiac catheterization on 05/18/97 per [name] revealed 80% LAD, 70% proximal, 90% mid first AL, 30% RCA.     e.  status post coronary bypass grafting x 2 per [name].    f.  recurrent chest pain with exertion, equivocal ETT.    g.  repeat cardiac catheterization revealed an occluded vein graft.      h.  medical therapy.    i.  ETT Myoview showed borderline changes.     j.  cardiac catheterization 8/02 revealed no significant change in anatomy from previous cath.     k. cardiac cath 06/19/05 revealed patent left internal mammary artery to occluded LAD. Saphenous vein to D1 was chronically occ   Benign essential HTN 11/19/2015   Benign fibroma of prostate 03/05/2014   Cannot sleep 03/05/2014   Colon polyp 04/12/2012   COVID 08/2020   HLD (hyperlipidemia) 11/19/2015   Presence of artificial eye 04/12/2012   Spinal stenosis 04/12/2012   Type 2 diabetes mellitus ([place]) 03/05/2014   Past Surgical History:  Procedure Laterality Date   COLONOSCOPY WITH PROPOFOL N/A 12/13/2017   Procedure: COLONOSCOPY WITH PROPOFOL;  Surgeon: [name], MD;  Location: [facility] ENDOSCOPY;  Service: Endoscopy;  Laterality: N/A;   CORONARY ARTERY BYPASS GRAFT  1998   CORONARY STENT PLACEMENT  2006   JOINT REPLACEMENT Left    hip- 06/2019   left eye     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)   LEFT HEART CATH AND CORONARY ANGIOGRAPHY Left 06/28/2018   Procedure: LEFT HEART CATH AND CORONARY ANGIOGRAPHY;  Surgeon: [name], MD;  Location: [facility];  Service: Cardiovascular;  Laterality: Left;   lt knee cap     had to have lt knee cap replaced   Family History  Problem Relation Age of Onset   Hypertension Brother    Diabetes Brother    Heart disease Mother    Heart disease Father    Diabetes Daughter    Schizophrenia Maternal Grandmother    Heart disease Brother    Prostate cancer Neg Hx    Bladder Cancer Neg Hx    Kidney cancer Neg Hx    Social History   Socioeconomic History   Marital status: Married    Spouse name: Not on file   Number of children: Not on file   Years of education: Not on file   Highest education level: Not on file  Occupational History   Not on file  Tobacco Use   Smoking status: Former    Types: Cigarettes    Quit date: 1998    Years since  quitting: 25.6   Smokeless tobacco: Never  Vaping Use   Vaping Use: Never used  Substance and Sexual Activity   Alcohol use: Yes    Alcohol/week: 6.0 standard drinks of alcohol    Types: 6 Cans of beer per week    Comment: occasional   Drug use: No   Sexual activity: Yes  Other Topics Concern   Not on file  Social History Narrative   Not on file   Social Determinants of Health   Financial Resource Strain: Low Risk  (  05/19/2022)   Overall Financial Resource Strain (CARDIA)    Difficulty of Paying Living Expenses: Not hard at all  Recent Concern: Financial Resource Strain - High Risk (04/27/2022)   Overall Financial Resource Strain (CARDIA)    Difficulty of Paying Living Expenses: Hard  Food Insecurity: No Food Insecurity (05/19/2022)   Hunger Vital Sign    Worried About Running Out of Food in the Last Year: Never true    Ran Out of Food in the Last Year: Never true  Transportation Needs: No Transportation Needs (05/19/2022)   PRAPARE - Hydrologist (Medical): No    Lack of Transportation (Non-Medical): No  Physical Activity: Inactive (05/19/2022)   Exercise Vital Sign    Days of Exercise per Week: 0 days    Minutes of Exercise per Session: 0 min  Stress: No Stress Concern Present (05/16/2021)   [place]    Feeling of Stress : Not at all  Social Connections: Moderately Isolated (05/19/2022)   Social Connection and Isolation Panel [NHANES]    Frequency of Communication with Friends and Family: Three times a week    Frequency of Social Gatherings with Friends and Family: Three times a week    Attends Religious Services: Never    Active Member of Clubs or Organizations: No    Attends Music therapist: Never    Marital Status: Married    Tobacco Counseling Counseling given: Not Answered   Clinical Intake:  Pre-visit preparation completed: Yes  Pain : No/denies pain      Nutritional Risks: None Diabetes: Yes CBG done?: No Did pt. bring in CBG monitor from home?: No  How often do you need to have someone help you when you read instructions, pamphlets, or other written materials from your doctor or pharmacy?: 1 - Never  Diabetic?  Yes  Nutrition Risk Assessment:  Has the patient had any N/V/D within the last 2 months?  No  Does the patient have any non-healing wounds?  No  Has the patient had any unintentional weight loss or weight gain?  No   Diabetes:  Is the patient diabetic?  Yes  If diabetic, was a CBG obtained today?  No  Did the patient bring in their glucometer from home?  No  How often do you monitor your CBG's?    1 x a day.   Financial Strains and Diabetes Management:  Are you having any financial strains with the device, your supplies or your medication? Yes .  Does the patient want to be seen by Chronic Care Management for management of their diabetes?  No  Would the patient like to be referred to a Nutritionist or for Diabetic Management?  No   Diabetic Exams:  Diabetic Eye ExamPt has been advised about the importance in completing this exam.  Diabetic Foot Exam: Pt has been advised about the importance in completing this exam. .    Interpreter Needed?: No  Information entered by :: Leroy Kennedy LPN   Activities of Daily Living    05/19/2022    9:31 AM  In your present state of health, do you have any difficulty performing the following activities:  Hearing? 0  Vision? 0  Difficulty concentrating or making decisions? 0  Walking or climbing stairs? 0  Dressing or bathing? 0  Doing errands, shopping? 0  Preparing Food and eating ? N  Using the Toilet? N  In the past six months, have  you accidently leaked urine? N  Do you have problems with loss of bowel control? N  Managing your Medications? N  Managing your Finances? N  Housekeeping or managing your Housekeeping? N    Patient Care Team: [name], NP as  PCP - General (Nurse Practitioner) Lane Hacker, [facility] (Pharmacist)  Indicate any recent Medical Services you may have received from other than [facility] providers in the past year (date may be approximate).     Assessment:   This is a routine wellness examination for [facility].  Hearing/Vision screen Hearing Screening - Comments:: No trouble hearing Vision Screening - Comments:: Not up to date [place] issues and exercise activities discussed: Current Exercise Habits: The patient does not participate in regular exercise at present   Goals Addressed             This Visit's Progress    Patient Stated       Stay healthy       Depression Screen    05/19/2022    9:19 AM 04/09/2022    9:42 AM 10/10/2021   10:24 AM 05/16/2021    9:07 AM 09/23/2020    2:05 PM 05/13/2020    9:03 AM 01/05/2020    1:13 PM  PHQ 2/9 Scores  PHQ - 2 Score 0 0 0 0 0 0 0  PHQ- 9 Score 2 3 0        Fall Risk    05/19/2022    9:13 AM 04/09/2022    9:42 AM 05/16/2021    9:07 AM 09/23/2020    2:05 PM 09/03/2020    8:03 AM  Fall Risk   Falls in the past year? 1 0 0 0 0  Number falls in past yr: 0 0   0  Injury with Fall? 0 0   0  Risk for fall due to :  No Fall Risks Medication side effect    Follow up Falls evaluation completed;Education provided;Falls prevention discussed Falls evaluation completed Falls evaluation completed;Education provided;Falls prevention discussed      FALL RISK PREVENTION PERTAINING TO THE HOME:  Any stairs in or around the home? Yes  If so, are there any without handrails? No  Home free of loose throw rugs in walkways, pet beds, electrical cords, etc? Yes  Adequate lighting in your home to reduce risk of falls? Yes   ASSISTIVE DEVICES UTILIZED TO PREVENT FALLS:  Life alert? No  Use of a cane, walker or w/c? No  Grab bars in the bathroom? Yes  Shower chair or bench in shower? No  Elevated toilet seat or a handicapped toilet? Yes   TIMED UP AND GO:  Was the  test performed? No .    Cognitive Function:        05/19/2022    9:14 AM 05/16/2021    9:08 AM 05/13/2020    9:04 AM  6CIT Screen  What Year?  0 points 0 points  What month?  0 points 0 points  What time? 0 points 0 points 0 points  Count back from 20 0 points 0 points 0 points  Months in reverse 0 points 0 points 0 points  Repeat phrase 0 points 4 points 2 points  Total Score  4 points 2 points    Immunizations Immunization History  Administered Date(s) Administered   Fluad Quad(high Dose 65+) 08/14/2019, 06/04/2020, 07/11/2021   Influenza, High Dose Seasonal PF 05/31/2015, 06/16/2017, 07/12/2018   Influenza-Unspecified 06/20/2014, 06/26/2015, 08/27/2016, 09/11/2016  PFIZER Comirnaty(Gray Top)Covid-19 Tri-Sucrose Vaccine 11/04/2019, 11/26/2019   PFIZER(Purple Top)SARS-COV-2 Vaccination 11/04/2019, 11/26/2019   Pneumococcal Conjugate-13 06/20/2014   Pneumococcal Polysaccharide-23 06/21/2015, 06/25/2015   Td 09/08/2008   Tdap 05/30/2012, 06/16/2018    TDAP status: Up to date  Flu Vaccine status: Up to date  Pneumococcal vaccine status: Up to date  Covid-19 vaccine status: Information provided on how to obtain vaccines.   Qualifies for Shingles Vaccine? Yes   Zostavax completed No   Shingrix Completed?: No.    Education has been provided regarding the importance of this vaccine. Patient has been advised to call insurance company to determine out of pocket expense if they have not yet received this vaccine. Advised may also receive vaccine at local pharmacy or Health Dept. Verbalized acceptance and understanding.  Screening Tests Health Maintenance  Topic Date Due   OPHTHALMOLOGY EXAM  12/20/2021   HEMOGLOBIN A1C  03/22/2022   INFLUENZA VACCINE  04/14/2022   COVID-19 Vaccine (5 - Pfizer series) 06/04/2022 (Originally 01/21/2020)   Zoster Vaccines- Shingrix (1 of 2) 08/18/2022 (Originally 11/20/1998)   FOOT EXAM  04/10/2023   COLONOSCOPY (Pts 45-9yr Insurance coverage  will need to be confirmed)  12/14/2027   TETANUS/TDAP  06/16/2028   Pneumonia Vaccine 71 Years old  Completed   Hepatitis C Screening  Completed   HPV VACCINES  Aged Out    Health Maintenance  Health Maintenance Due  Topic Date Due   OPHTHALMOLOGY EXAM  12/20/2021   HEMOGLOBIN A1C  03/22/2022   INFLUENZA VACCINE  04/14/2022    Colorectal cancer screening: Type of screening: Colonoscopy. Completed 2019. Repeat every 10 years  Lung Cancer Screening: (Low Dose CT Chest recommended if Age 73-80years, 30 pack-year currently smoking OR have quit w/in 15years.) does not qualify.   Lung Cancer Screening Referral:   Additional Screening:  Hepatitis C Screening: does not qualify; Completed 2021  Vision Screening: Recommended annual ophthalmology exams for early detection of glaucoma and other disorders of the eye. Is the patient up to date with their annual eye exam?  Yes  Who is the provider or what is the name of the office in which the patient attends annual [place] exams[place] is not established with a provider, would they like to be referred to a provider to establish care? No .   Dental Screening: Recommended annual dental exams for proper oral hygiene  Community Resource Referral / Chronic Care Management: CRR required this visit?  No   CCM required this visit?  No      Plan:     I have personally reviewed and noted the following in the patient's chart:   Medical and social history Use of alcohol, tobacco or illicit drugs  Current medications and supplements including opioid prescriptions. Patient is not currently taking opioid prescriptions. Functional ability and status Nutritional status Physical activity Advanced directives List of other physicians Hospitalizations, surgeries, and ER visits in previous 12 months Vitals Screenings to include cognitive, depression, and falls Referrals and appointments  In addition, I have reviewed and discussed  with patient certain preventive protocols, quality metrics, and best practice recommendations. A written personalized care plan for preventive services as well as general preventive health recommendations were provided to patient.     JLeroy Kennedy LPN   [id number]  Nurse Notes:

## 2022-05-21 ENCOUNTER — Encounter: Payer: Self-pay | Admitting: Nurse Practitioner

## 2022-05-21 ENCOUNTER — Ambulatory Visit (INDEPENDENT_AMBULATORY_CARE_PROVIDER_SITE_OTHER): Payer: Medicare HMO | Admitting: Nurse Practitioner

## 2022-05-21 VITALS — BP 138/78 | HR 59 | Temp 97.8°F | Ht 72.0 in | Wt 178.5 lb

## 2022-05-21 DIAGNOSIS — E1165 Type 2 diabetes mellitus with hyperglycemia: Secondary | ICD-10-CM | POA: Diagnosis not present

## 2022-05-21 DIAGNOSIS — F5101 Primary insomnia: Secondary | ICD-10-CM

## 2022-05-21 DIAGNOSIS — Z794 Long term (current) use of insulin: Secondary | ICD-10-CM | POA: Diagnosis not present

## 2022-05-21 MED ORDER — AMITRIPTYLINE HCL 10 MG PO TABS: 30.0000 mg | ORAL_TABLET | Freq: Every day | ORAL | 4 refills | Status: DC

## 2022-05-21 MED ORDER — ATENOLOL 50 MG PO TABS: 50.0000 mg | ORAL_TABLET | Freq: Every day | ORAL | 4 refills | Status: DC

## 2022-05-21 MED ORDER — METFORMIN HCL 500 MG PO TABS
ORAL_TABLET | ORAL | 4 refills | Status: DC
Start: 2022-05-21 — End: 2023-07-12

## 2022-05-21 MED ORDER — OMEGA-3-ACID ETHYL ESTERS 1 G PO CAPS
2.0000 g | ORAL_CAPSULE | Freq: Two times a day (BID) | ORAL | 4 refills | Status: DC
Start: 2022-05-21 — End: 2023-07-22

## 2022-05-21 MED ORDER — CLOPIDOGREL BISULFATE 75 MG PO TABS: 75.0000 mg | ORAL_TABLET | Freq: Every day | ORAL | 4 refills | Status: DC

## 2022-05-21 MED ORDER — FENOFIBRATE 145 MG PO TABS: 145.0000 mg | ORAL_TABLET | Freq: Every day | ORAL | 4 refills | Status: DC

## 2022-05-21 MED ORDER — RAMIPRIL 1.25 MG PO CAPS: 1.2500 mg | ORAL_CAPSULE | Freq: Every day | ORAL | 4 refills | Status: DC

## 2022-05-21 NOTE — Assessment & Plan Note (Signed)
Chronic, ongoing.  Followed by endocrinology with recent A1c 11.4% and urine ALB 150 (January 2023) -- continue Ramipril for kidney protection.  Continue current medication regimen as prescribed by endocrinology, notes reviewed.  CCM collaboration continues -- may need help with cost of medications in future. Recommend he check BS at least twice a day, fasting and 2 hours after a meal.  Reviewed medications to be taking with him.
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
## 2022-05-21 NOTE — Assessment & Plan Note (Signed)
Chronic, ongoing.  Not sleeping well, has tried multiple medications.  Belsomra too costly.  Would avoid Ambien and benzo use due to age and BEERS.  Increase Amtriptyline to 30 MG at night which has offered benefit to him. Continue to monitor and adjust regimen as needed.  Recommend sleep hygiene techniques.  Could consider therapy for CBT in future.

## 2022-05-22 ENCOUNTER — Ambulatory Visit: Payer: Medicare HMO

## 2022-06-02 ENCOUNTER — Telehealth: Payer: Self-pay

## 2022-06-02 DIAGNOSIS — Z794 Long term (current) use of insulin: Secondary | ICD-10-CM | POA: Diagnosis not present

## 2022-06-02 DIAGNOSIS — E1159 Type 2 diabetes mellitus with other circulatory complications: Secondary | ICD-10-CM | POA: Diagnosis not present

## 2022-06-02 DIAGNOSIS — I152 Hypertension secondary to endocrine disorders: Secondary | ICD-10-CM | POA: Diagnosis not present

## 2022-06-02 DIAGNOSIS — E1165 Type 2 diabetes mellitus with hyperglycemia: Secondary | ICD-10-CM | POA: Diagnosis not present

## 2022-06-02 DIAGNOSIS — E1169 Type 2 diabetes mellitus with other specified complication: Secondary | ICD-10-CM | POA: Diagnosis not present

## 2022-06-02 DIAGNOSIS — E785 Hyperlipidemia, unspecified: Secondary | ICD-10-CM | POA: Diagnosis not present

## 2022-06-02 NOTE — Progress Notes (Signed)
    Chronic Care Management Pharmacy Assistant   Name: Osmar Howton  MRN: [id number] DOB: [birth year]  Medications: Outpatient Encounter Medications as of 06/02/2022  Medication Sig   ACCU-CHEK GUIDE test strip 1 each by Other route 2 times daily at 12 noon and 4 pm.   amitriptyline (ELAVIL) 10 MG tablet Take 3 tablets (30 mg total) by mouth at bedtime.   aspirin EC 81 MG tablet Take 81 mg by mouth daily.    atenolol (TENORMIN) 50 MG tablet Take 1 tablet (50 mg total) by mouth daily.   clopidogrel (PLAVIX) 75 MG tablet Take 1 tablet (75 mg total) by mouth daily.   Evolocumab (REPATHA) 140 MG/ML SOSY Inject 140 mg into the skin every 14 (fourteen) days. (Patient not taking: Reported on 05/21/2022)   fenofibrate (TRICOR) 145 MG tablet Take 1 tablet (145 mg total) by mouth daily.   glipiZIDE (GLUCOTROL XL) 2.5 MG 24 hr tablet Take 2.5 mg by mouth daily.   insulin degludec (TRESIBA FLEXTOUCH) 100 UNIT/ML FlexTouch Pen Inject 70 Units into the skin daily.    metFORMIN (GLUCOPHAGE) 500 MG tablet TAKE 2 TABLETS TWICE DAILY WITH MEALS   neomycin-polymyxin-dexamethasone (MAXITROL) 0.1 % ophthalmic suspension Place 1 drop into the left eye 4 (four) times daily.   nitroGLYCERIN (NITROSTAT) 0.4 MG SL tablet Place 0.4 mg under the tongue every 5 (five) minutes as needed.   omega-3 acid ethyl esters (LOVAZA) 1 g capsule Take 2 capsules (2 g total) by mouth 2 (two) times daily.   pantoprazole (PROTONIX) 20 MG tablet Take 1 tablet (20 mg total) by mouth daily.   ramipril (ALTACE) 1.25 MG capsule Take 1 capsule (1.25 mg total) by mouth daily.   vitamin B-12 (CYANOCOBALAMIN) 1000 MCG tablet Take 1,000 mcg by mouth daily.   [START ON 06/03/2022] Zoster Vaccine Adjuvanted [facility]) injection Inject 0.5 mLs into the muscle once for 1 dose. Dose # 2   No facility-administered encounter medications on file as of 06/02/2022.   PAP Update:  [employer]   to follow up on patient assistance  application for [place]. Per representative at  [facility]  states  patient needs to sign pages 2-3 of application. Patient put dob instead of the date. Refax application back to [employer].     [place] [phone]

## 2022-07-08 ENCOUNTER — Other Ambulatory Visit: Payer: Self-pay | Admitting: Nurse Practitioner

## 2022-07-09 NOTE — Telephone Encounter (Signed)
Requested Prescriptions  Pending Prescriptions Disp Refills  . pantoprazole (PROTONIX) 20 MG tablet [Pharmacy Med Name: PANTOPRAZOLE SODIUM 20 MG Tablet Delayed Release] 90 tablet 0    Sig: TAKE 1 TABLET EVERY DAY (DISCONTINUE '40MG'$  DOSE)     Gastroenterology: Proton Pump Inhibitors Passed - 07/08/2022  2:56 PM      Passed - Valid encounter within last 12 months    Recent Outpatient Visits          1 month ago Type 2 diabetes mellitus with hyperglycemia, with long-term current use of insulin ([place])   [place], Jolene T, NP   3 months ago Type 2 diabetes mellitus with hyperglycemia, with long-term current use of insulin ([place])   [place], Jolene T, NP   9 months ago Type 2 diabetes mellitus with hyperglycemia, with long-term current use of insulin ([place])   [place], Jolene T, NP   12 months ago Type 2 diabetes mellitus with hyperglycemia, with long-term current use of insulin ([place])   [place], [place]ald[place] T, NP   1 year ago Type 2 diabetes mellitus with hyperglycemia, with long-term current use of insulin ([place])   [place], Barbaraann Faster, NP      Future Appointments            In 6 days Cannady, Barbaraann Faster, NP [name]           . rosuvastatin (CRESTOR) 40 MG tablet [Pharmacy Med Name: ROSUVASTATIN CALCIUM 40 MG Tablet] 90 tablet 10    Sig: TAKE 1 TABLET EVERY DAY     Cardiovascular:  Antilipid - Statins 2 Failed - 07/08/2022  2:56 PM      Failed - Lipid Panel in normal range within the last 12 months    Cholesterol, Total  Date Value Ref Range Status  04/17/2022 186 100 - 199 mg/dL Final   LDL Chol Calc (NIH)  Date Value Ref Range Status  04/17/2022 Comment (A) 0 - 99 mg/dL Final    Comment:    Triglyceride result indicated is too high for an accurate LDL cholesterol estimation.    HDL  Date Value Ref Range Status  04/17/2022 26 (L) >39 mg/dL  Final   Triglycerides  Date Value Ref Range Status  04/17/2022 1,040 (HH) 0 - 149 mg/dL Final    Comment:    Results confirmed on dilution.          Passed - Cr in normal range and within 360 days    Creatinine  Date Value Ref Range Status  09/20/2014 1.10 0.60 - 1.30 mg/dL Final   Creatinine, Ser  Date Value Ref Range Status  04/17/2022 1.17 0.76 - 1.27 mg/dL Final         Passed - Patient is not pregnant      Passed - Valid encounter within last 12 months    Recent Outpatient Visits          1 month ago Type 2 diabetes mellitus with hyperglycemia, with long-term current use of insulin ([place])   [place], Jolene T, NP   3 months ago Type 2 diabetes mellitus with hyperglycemia, with long-term current use of insulin ([place])   [place], Jolene T, NP   9 months ago Type 2 diabetes mellitus with hyperglycemia, with long-term current use of insulin ([place])   [place], [place] T, NP   12 months ago Type  2 diabetes mellitus with hyperglycemia, with long-term current use of insulin ([place])   [place], [place] T, NP   1 year ago Type 2 diabetes mellitus with hyperglycemia, with long-term current use of insulin ([place])   [place], Barbaraann Faster, NP      Future Appointments            In 6 days Cannady, Barbaraann Faster, NP [name]

## 2022-07-09 NOTE — Telephone Encounter (Signed)
Requested medication (s) are due for refill today: possibly   Requested medication (s) are on the active medication list: no  Last refill:  unsure  Future visit scheduled: yes  Notes to clinic:  Unable to refill per protocol, Rx expired. Medication is not on current list, routing for review.      Requested Prescriptions  Pending Prescriptions Disp Refills   rosuvastatin (CRESTOR) 40 MG tablet [Pharmacy Med Name: ROSUVASTATIN CALCIUM 40 MG Tablet] 90 tablet 10    Sig: TAKE 1 TABLET EVERY DAY     Cardiovascular:  Antilipid - Statins 2 Failed - 07/08/2022  2:56 PM      Failed - Lipid Panel in normal range within the last 12 months    Cholesterol, Total  Date Value Ref Range Status  04/17/2022 186 100 - 199 mg/dL Final   LDL Chol Calc (NIH)  Date Value Ref Range Status  04/17/2022 Comment (A) 0 - 99 mg/dL Final    Comment:    Triglyceride result indicated is too high for an accurate LDL cholesterol estimation.    HDL  Date Value Ref Range Status  04/17/2022 26 (L) >39 mg/dL Final   Triglycerides  Date Value Ref Range Status  04/17/2022 1,040 (HH) 0 - 149 mg/dL Final    Comment:    Results confirmed on dilution.          Passed - Cr in normal range and within 360 days    Creatinine  Date Value Ref Range Status  09/20/2014 1.10 0.60 - 1.30 mg/dL Final   Creatinine, Ser  Date Value Ref Range Status  04/17/2022 1.17 0.76 - 1.27 mg/dL Final         Passed - Patient is not pregnant      Passed - Valid encounter within last 12 months    Recent Outpatient Visits           1 month ago Type 2 diabetes mellitus with hyperglycemia, with long-term current use of insulin ([place])   [place], [place] T, NP   3 months ago Type 2 diabetes mellitus with hyperglycemia, with long-term current use of insulin ([place])   [place], Jolene T, NP   9 months ago Type 2 diabetes mellitus with hyperglycemia, with long-term current use of  insulin ([place])   [place], Jolene T, NP   12 months ago Type 2 diabetes mellitus with hyperglycemia, with long-term current use of insulin ([place])   [place], [place] T, NP   1 year ago Type 2 diabetes mellitus with hyperglycemia, with long-term current use of insulin ([place])   [place], Barbaraann Faster, NP       Future Appointments             In 6 days Cannady, [place]y [place] T, NP [name]            Signed Prescriptions Disp Refills   pantoprazole (PROTONIX) 20 MG tablet 90 tablet 0    Sig: TAKE 1 TABLET EVERY DAY (DISCONTINUE '40MG'$  DOSE)     Gastroenterology: Proton Pump Inhibitors Passed - 07/08/2022  2:56 PM      Passed - Valid encounter within last 12 months    Recent Outpatient Visits           1 month ago Type 2 diabetes mellitus with hyperglycemia, with long-term current use of insulin ([place])   [place], Barbaraann Faster, NP   3  months ago Type 2 diabetes mellitus with hyperglycemia, with long-term current use of insulin ([place])   [place], Jolene T, NP   9 months ago Type 2 diabetes mellitus with hyperglycemia, with long-term current use of insulin ([place])   [place], Jolene T, NP   12 months ago Type 2 diabetes mellitus with hyperglycemia, with long-term current use of insulin ([place])   [place], [place] T, NP   1 year ago Type 2 diabetes mellitus with hyperglycemia, with long-term current use of insulin ([place])   [place], Barbaraann Faster, NP       Future Appointments             In 6 days Cannady, Barbaraann Faster, NP [name]

## 2022-07-12 NOTE — Patient Instructions (Incomplete)
Diabetes Mellitus Basics  Diabetes mellitus, or diabetes, is a long-term (chronic) disease. It occurs when the body does not properly use sugar (glucose) that is released from food after you eat. Diabetes mellitus may be caused by one or both of these problems: Your pancreas does not make enough of a hormone called insulin. Your body does not react in a normal way to the insulin that it makes. Insulin lets glucose enter cells in your body. This gives you energy. If you have diabetes, glucose cannot get into cells. This causes high blood glucose (hyperglycemia). How to treat and manage diabetes You may need to take insulin or other diabetes medicines daily to keep your glucose in balance. If you are prescribed insulin, you will learn how to give yourself insulin by injection. You may need to adjust the amount of insulin you take based on the foods that you eat. You will need to check your blood glucose levels using a glucose monitor as told by your health care provider. The readings can help determine if you have low or high blood glucose. Generally, you should have these blood glucose levels: Before meals (preprandial): 80-130 mg/dL (4.4-7.2 mmol/L). After meals (postprandial): below 180 mg/dL (10 mmol/L). Hemoglobin A1c (HbA1c) level: less than 7%. Your health care provider will set treatment goals for you. Keep all follow-up visits. This is important. Follow these instructions at home: Diabetes medicines Take your diabetes medicines every day as told by your health care provider. List your diabetes medicines here: Name of medicine: ______________________________ Amount (dose): _______________ Time (a.m./p.m.): _______________ Notes: ___________________________________ Name of medicine: ______________________________ Amount (dose): _______________ Time (a.m./p.m.): _______________ Notes: ___________________________________ Name of medicine: ______________________________ Amount (dose):  _______________ Time (a.m./p.m.): _______________ Notes: ___________________________________ Insulin If you use insulin, list the types of insulin you use here: Insulin type: ______________________________ Amount (dose): _______________ Time (a.m./p.m.): _______________Notes: ___________________________________ Insulin type: ______________________________ Amount (dose): _______________ Time (a.m./p.m.): _______________ Notes: ___________________________________ Insulin type: ______________________________ Amount (dose): _______________ Time (a.m./p.m.): _______________ Notes: ___________________________________ Insulin type: ______________________________ Amount (dose): _______________ Time (a.m./p.m.): _______________ Notes: ___________________________________ Insulin type: ______________________________ Amount (dose): _______________ Time (a.m./p.m.): _______________ Notes: ___________________________________ Managing blood glucose  Check your blood glucose levels using a glucose monitor as told by your health care provider. Write down the times that you check your glucose levels here: Time: _______________ Notes: ___________________________________ Time: _______________ Notes: ___________________________________ Time: _______________ Notes: ___________________________________ Time: _______________ Notes: ___________________________________ Time: _______________ Notes: ___________________________________ Time: _______________ Notes: ___________________________________  Low blood glucose Low blood glucose (hypoglycemia) is when glucose is at or below 70 mg/dL (3.9 mmol/L). Symptoms may include: Feeling: Hungry. Sweaty and clammy. Irritable or easily upset. Dizzy. Sleepy. Having: A fast heartbeat. A headache. A change in your vision. Numbness around the mouth, lips, or tongue. Having trouble with: Moving (coordination). Sleeping. Treating low blood glucose To treat low blood  glucose, eat or drink something containing sugar right away. If you can think clearly and swallow safely, follow the 15:15 rule: Take 15 grams of a fast-acting carb (carbohydrate), as told by your health care provider. Some fast-acting carbs are: Glucose tablets: take 3-4 tablets. Hard candy: eat 3-5 pieces. Fruit juice: drink 4 oz (120 mL). Regular (not diet) soda: drink 4-6 oz (120-180 mL). Honey or sugar: eat 1 Tbsp (15 mL). Check your blood glucose levels 15 minutes after you take the carb. If your glucose is still at or below 70 mg/dL (3.9 mmol/L), take 15 grams of a carb again. If your glucose does not go above 70 mg/dL (3.9 mmol/L) after   3 tries, get help right away. After your glucose goes back to normal, eat a meal or a snack within 1 hour. Treating very low blood glucose If your glucose is at or below 54 mg/dL (3 mmol/L), you have very low blood glucose (severe hypoglycemia). This is an emergency. Do not wait to see if the symptoms will go away. Get medical help right away. Call your local emergency services (911 in the [place] Do not drive yourself to the hospital. Questions to ask your health care provider Should I talk with a diabetes educator? What equipment will I need to care for myself at home? What diabetes medicines do I need? When should I take them? How often do I need to check my blood glucose levels? What number can I call if I have questions? When is my follow-up visit? Where can I find a support group for people with diabetes? Where to find more information American Diabetes Association: www.diabetes.org Association of Diabetes Care and Education Specialists: www.diabeteseducator.org Contact a health care provider if: Your blood glucose is at or above 240 mg/dL (13.3 mmol/L) for 2 days in a row. You have been sick or have had a fever for 2 days or more, and you are not getting better. You have any of these problems for more than 6 hours: You cannot eat or  drink. You feel nauseous. You vomit. You have diarrhea. Get help right away if: Your blood glucose is lower than 54 mg/dL (3 mmol/L). You get confused. You have trouble thinking clearly. You have trouble breathing. These symptoms may represent a serious problem that is an emergency. Do not wait to see if the symptoms will go away. Get medical help right away. Call your local emergency services (911 in the [place] Do not drive yourself to the hospital. Summary Diabetes mellitus is a chronic disease that occurs when the body does not properly use sugar (glucose) that is released from food after you eat. Take insulin and diabetes medicines as told. Check your blood glucose every day, as often as told. Keep all follow-up visits. This is important. This information is not intended to replace advice given to you by your health care provider. Make sure you discuss any questions you have with your health care provider. Document Revised: 01/02/2020 Document Reviewed: 01/02/2020 Elsevier Patient Education  2023 Elsevier Inc.  

## 2022-07-15 ENCOUNTER — Encounter: Payer: Medicare HMO | Admitting: Nurse Practitioner

## 2022-07-26 NOTE — Patient Instructions (Signed)
Diabetes Mellitus Basics  Diabetes mellitus, or diabetes, is a long-term (chronic) disease. It occurs when the body does not properly use sugar (glucose) that is released from food after you eat. Diabetes mellitus may be caused by one or both of these problems: Your pancreas does not make enough of a hormone called insulin. Your body does not react in a normal way to the insulin that it makes. Insulin lets glucose enter cells in your body. This gives you energy. If you have diabetes, glucose cannot get into cells. This causes high blood glucose (hyperglycemia). How to treat and manage diabetes You may need to take insulin or other diabetes medicines daily to keep your glucose in balance. If you are prescribed insulin, you will learn how to give yourself insulin by injection. You may need to adjust the amount of insulin you take based on the foods that you eat. You will need to check your blood glucose levels using a glucose monitor as told by your health care provider. The readings can help determine if you have low or high blood glucose. Generally, you should have these blood glucose levels: Before meals (preprandial): 80-130 mg/dL (4.4-7.2 mmol/L). After meals (postprandial): below 180 mg/dL (10 mmol/L). Hemoglobin A1c (HbA1c) level: less than 7%. Your health care provider will set treatment goals for you. Keep all follow-up visits. This is important. Follow these instructions at home: Diabetes medicines Take your diabetes medicines every day as told by your health care provider. List your diabetes medicines here: Name of medicine: ______________________________ Amount (dose): _______________ Time (a.m./p.m.): _______________ Notes: ___________________________________ Name of medicine: ______________________________ Amount (dose): _______________ Time (a.m./p.m.): _______________ Notes: ___________________________________ Name of medicine: ______________________________ Amount (dose):  _______________ Time (a.m./p.m.): _______________ Notes: ___________________________________ Insulin If you use insulin, list the types of insulin you use here: Insulin type: ______________________________ Amount (dose): _______________ Time (a.m./p.m.): _______________Notes: ___________________________________ Insulin type: ______________________________ Amount (dose): _______________ Time (a.m./p.m.): _______________ Notes: ___________________________________ Insulin type: ______________________________ Amount (dose): _______________ Time (a.m./p.m.): _______________ Notes: ___________________________________ Insulin type: ______________________________ Amount (dose): _______________ Time (a.m./p.m.): _______________ Notes: ___________________________________ Insulin type: ______________________________ Amount (dose): _______________ Time (a.m./p.m.): _______________ Notes: ___________________________________ Managing blood glucose  Check your blood glucose levels using a glucose monitor as told by your health care provider. Write down the times that you check your glucose levels here: Time: _______________ Notes: ___________________________________ Time: _______________ Notes: ___________________________________ Time: _______________ Notes: ___________________________________ Time: _______________ Notes: ___________________________________ Time: _______________ Notes: ___________________________________ Time: _______________ Notes: ___________________________________  Low blood glucose Low blood glucose (hypoglycemia) is when glucose is at or below 70 mg/dL (3.9 mmol/L). Symptoms may include: Feeling: Hungry. Sweaty and clammy. Irritable or easily upset. Dizzy. Sleepy. Having: A fast heartbeat. A headache. A change in your vision. Numbness around the mouth, lips, or tongue. Having trouble with: Moving (coordination). Sleeping. Treating low blood glucose To treat low blood  glucose, eat or drink something containing sugar right away. If you can think clearly and swallow safely, follow the 15:15 rule: Take 15 grams of a fast-acting carb (carbohydrate), as told by your health care provider. Some fast-acting carbs are: Glucose tablets: take 3-4 tablets. Hard candy: eat 3-5 pieces. Fruit juice: drink 4 oz (120 mL). Regular (not diet) soda: drink 4-6 oz (120-180 mL). Honey or sugar: eat 1 Tbsp (15 mL). Check your blood glucose levels 15 minutes after you take the carb. If your glucose is still at or below 70 mg/dL (3.9 mmol/L), take 15 grams of a carb again. If your glucose does not go above 70 mg/dL (3.9 mmol/L) after   3 tries, get help right away. After your glucose goes back to normal, eat a meal or a snack within 1 hour. Treating very low blood glucose If your glucose is at or below 54 mg/dL (3 mmol/L), you have very low blood glucose (severe hypoglycemia). This is an emergency. Do not wait to see if the symptoms will go away. Get medical help right away. Call your local emergency services (911 in the [place] Do not drive yourself to the hospital. Questions to ask your health care provider Should I talk with a diabetes educator? What equipment will I need to care for myself at home? What diabetes medicines do I need? When should I take them? How often do I need to check my blood glucose levels? What number can I call if I have questions? When is my follow-up visit? Where can I find a support group for people with diabetes? Where to find more information American Diabetes Association: www.diabetes.org Association of Diabetes Care and Education Specialists: www.diabeteseducator.org Contact a health care provider if: Your blood glucose is at or above 240 mg/dL (13.3 mmol/L) for 2 days in a row. You have been sick or have had a fever for 2 days or more, and you are not getting better. You have any of these problems for more than 6 hours: You cannot eat or  drink. You feel nauseous. You vomit. You have diarrhea. Get help right away if: Your blood glucose is lower than 54 mg/dL (3 mmol/L). You get confused. You have trouble thinking clearly. You have trouble breathing. These symptoms may represent a serious problem that is an emergency. Do not wait to see if the symptoms will go away. Get medical help right away. Call your local emergency services (911 in the [place] Do not drive yourself to the hospital. Summary Diabetes mellitus is a chronic disease that occurs when the body does not properly use sugar (glucose) that is released from food after you eat. Take insulin and diabetes medicines as told. Check your blood glucose every day, as often as told. Keep all follow-up visits. This is important. This information is not intended to replace advice given to you by your health care provider. Make sure you discuss any questions you have with your health care provider. Document Revised: 01/02/2020 Document Reviewed: 01/02/2020 Elsevier Patient Education  2023 Elsevier Inc.  

## 2022-07-31 ENCOUNTER — Encounter: Payer: Self-pay | Admitting: Nurse Practitioner

## 2022-07-31 ENCOUNTER — Ambulatory Visit (INDEPENDENT_AMBULATORY_CARE_PROVIDER_SITE_OTHER): Payer: Medicare HMO | Admitting: Nurse Practitioner

## 2022-07-31 VITALS — BP 131/67 | HR 59 | Temp 98.5°F | Ht 72.01 in | Wt 185.0 lb

## 2022-07-31 DIAGNOSIS — E781 Pure hyperglyceridemia: Secondary | ICD-10-CM | POA: Diagnosis not present

## 2022-07-31 DIAGNOSIS — E785 Hyperlipidemia, unspecified: Secondary | ICD-10-CM

## 2022-07-31 DIAGNOSIS — E1159 Type 2 diabetes mellitus with other circulatory complications: Secondary | ICD-10-CM

## 2022-07-31 DIAGNOSIS — N400 Enlarged prostate without lower urinary tract symptoms: Secondary | ICD-10-CM

## 2022-07-31 DIAGNOSIS — F5101 Primary insomnia: Secondary | ICD-10-CM

## 2022-07-31 DIAGNOSIS — I2510 Atherosclerotic heart disease of native coronary artery without angina pectoris: Secondary | ICD-10-CM

## 2022-07-31 DIAGNOSIS — I152 Hypertension secondary to endocrine disorders: Secondary | ICD-10-CM

## 2022-07-31 DIAGNOSIS — E1122 Type 2 diabetes mellitus with diabetic chronic kidney disease: Secondary | ICD-10-CM | POA: Diagnosis not present

## 2022-07-31 DIAGNOSIS — E538 Deficiency of other specified B group vitamins: Secondary | ICD-10-CM

## 2022-07-31 DIAGNOSIS — R809 Proteinuria, unspecified: Secondary | ICD-10-CM

## 2022-07-31 DIAGNOSIS — N183 Chronic kidney disease, stage 3 unspecified: Secondary | ICD-10-CM

## 2022-07-31 DIAGNOSIS — Z794 Long term (current) use of insulin: Secondary | ICD-10-CM

## 2022-07-31 DIAGNOSIS — Z0000 Encounter for general adult medical examination without abnormal findings: Secondary | ICD-10-CM | POA: Diagnosis not present

## 2022-07-31 DIAGNOSIS — E1169 Type 2 diabetes mellitus with other specified complication: Secondary | ICD-10-CM

## 2022-07-31 DIAGNOSIS — E1129 Type 2 diabetes mellitus with other diabetic kidney complication: Secondary | ICD-10-CM

## 2022-07-31 DIAGNOSIS — K219 Gastro-esophageal reflux disease without esophagitis: Secondary | ICD-10-CM | POA: Diagnosis not present

## 2022-07-31 DIAGNOSIS — Z23 Encounter for immunization: Secondary | ICD-10-CM | POA: Diagnosis not present

## 2022-07-31 DIAGNOSIS — E1165 Type 2 diabetes mellitus with hyperglycemia: Secondary | ICD-10-CM

## 2022-07-31 MED ORDER — ROSUVASTATIN CALCIUM 40 MG PO TABS: 40.0000 mg | ORAL_TABLET | Freq: Every day | ORAL | 4 refills | Status: DC

## 2022-07-31 MED ORDER — PANTOPRAZOLE SODIUM 20 MG PO TBEC: DELAYED_RELEASE_TABLET | ORAL | 4 refills | Status: DC

## 2022-07-31 MED ORDER — AMITRIPTYLINE HCL 10 MG PO TABS: 40.0000 mg | ORAL_TABLET | Freq: Every day | ORAL | 4 refills | Status: DC

## 2022-07-31 NOTE — Progress Notes (Signed)
BP 131/67   Pulse (!) 59   Temp 98.5 F (36.9 C) (Oral)   Ht 6' 0.01" (1.829 m)   Wt 185 lb (83.9 kg)   SpO2 98%   BMI 25.08 kg/m    Subjective:    Patient ID: [name], male    DOB: Oct 21, 1950, 71 y.o.   MRN: [id number]  HPI: Truett Mcfarlan is a 71 y.o. male presenting on 07/31/2022 for comprehensive medical examination. Current medical complaints include:none  He currently lives with: significant other Interim Problems from his last visit: no  DIABETES Followed by endocrinology and last saw Dr. Honor Junes on 06/02/22 with A1c 11.4%, Glipizide was increased.  Currently using Tresiba, Metformin, Glipizide.  Was diagnosed >10 years ago.  Tried Jardiance in past, but was too costly.    History of low B12 levels, is taking supplement daily. Hypoglycemic episodes:no Polydipsia/polyuria: no Visual disturbance: no Chest pain: no Paresthesias: no Glucose Monitoring: yes             Accucheck frequency: Daily             Fasting glucose: 126 this morning, trending down             Post prandial:             Evening:             Before meals: Taking Insulin?: yes             Long acting insulin: Tresiba 70 units             Short acting insulin: Blood Pressure Monitoring: not checking Retinal Examination: Not Up To Date -- [facility], needs to schedule Foot Exam: Up to Date Pneumovax: Up to Date Influenza: Up to Date Aspirin: yes   GERD Magnesium level remained a little low recent check -- taking magnesium at home, continues on Protonix. GERD control status: stable Satisfied with current treatment? yes Heartburn frequency: occasional Medication side effects: no  Medication compliance: stable Previous GERD medications: Antacid use frequency:  occasional use Dysphagia: no Odynophagia:  no Hematemesis: no Blood in stool: no EGD: no  CHRONIC KIDNEY DISEASE Recent kidney labs stable. CKD status: stable   Medications renally dose: yes Previous renal  evaluation: no Pneumovax:  Up to Date Influenza Vaccine:  Up to Date    HYPERTENSION / HYPERLIPIDEMIA Taking Ramipril 1.25 MG, Atenolol 50 MG, ASA, Fenofibrate, Crestor, Lovaza, and Plavix.  Last seen by Dr. Ubaldo Glassing 03/18/21, to continue dual anti-platelet therapy -- to see them yearly.  Quit smoking in 1998, smoked about 2 PPD.  History of CABG in 1998, stent in 2006, catheterization in 2011.  Attempted to get Repatha covered, but insurance denied.   Last echo in 2019 and EF 55% .  Has not had to use NTG recently. Satisfied with current treatment? yes Duration of hypertension: chronic BP monitoring frequency: not checking BP range:  BP medication side effects: no Duration of hyperlipidemia: chronic Cholesterol medication side effects: no Cholesterol supplements: none Medication compliance: good compliance Aspirin: yes Recent stressors: no Recurrent headaches: no Visual changes: no Palpitations: no Dyspnea: no Chest pain: no Lower extremity edema: no Dizzy/lightheaded: no    INSOMNIA Continues Amitriptyline 40 MG at bedtime.  He continues to have issues with falling asleep.  Has tried Trazodone, Ambien, Melatonin in past.  Feels like medication will work good and then stop working.  Has never had a sleep study, does not snore at night. Duration: chronic Satisfied  with sleep quality: no Difficulty falling asleep: yes Difficulty staying asleep: yes Waking a few hours after sleep onset: yes Early morning awakenings: no Daytime hypersomnolence: no Wakes feeling refreshed: yes Good sleep hygiene: yes Apnea: no Snoring: no Depressed/anxious mood: no Recent stress: no Restless legs/nocturnal leg cramps: no Chronic pain/arthritis: no History of sleep study: yes in past, was negative Treatments attempted: ambien, Trazodone, Melatonin   Functional Status Survey: Is the patient deaf or have difficulty hearing?: No Does the patient have difficulty seeing, even when wearing  glasses/contacts?: No Does the patient have difficulty concentrating, remembering, or making decisions?: No Does the patient have difficulty walking or climbing stairs?: No Does the patient have difficulty dressing or bathing?: No Does the patient have difficulty doing errands alone such as visiting a doctor's office or shopping?: No  FALL RISK:    05/21/2022    8:20 AM 05/19/2022    9:13 AM 04/09/2022    9:42 AM 05/16/2021    9:07 AM 09/23/2020    2:05 PM  [place] in the past year? 1 1 0 0 0  Number falls in past yr: 0 0 0    Injury with Fall? 1 0 0    Risk for fall due to : History of fall(s)  No Fall Risks Medication side effect   Follow up Falls evaluation completed Falls evaluation completed;Education provided;Falls prevention discussed Falls evaluation completed Falls evaluation completed;Education provided;Falls prevention discussed     Depression Screen    05/21/2022    8:20 AM 05/19/2022    9:19 AM 04/09/2022    9:42 AM 10/10/2021   10:24 AM 05/16/2021    9:07 AM  Depression screen PHQ 2/9  Decreased Interest 0 0 0 0 0  Down, Depressed, Hopeless 0 0 0 0 0  PHQ - 2 Score 0 0 0 0 0  Altered sleeping _0 0   Tired, decreased energy 0 0 1 0   Change in appetite 0 0 0 0   Feeling bad or failure about yourself  0 0 0 0   Trouble concentrating 0 0 0 0   Moving slowly or fidgety/restless 0 0 0 0   Suicidal thoughts 0  0 0   PHQ-9 Score _1 0   Difficult doing work/chores Somewhat difficult Not difficult at all Not difficult at all     Past Medical History:  Past Medical History:  Diagnosis Date   Acid reflux 04/12/2012   Allergy    Arteriosclerosis of coronary artery 04/12/2012   Overview:  1.  Atherosclerotic coronary artery disease.    a.  normal LV function.  Coronary arteriography 5/98 revealed 50% LAD lesion, 95% lesion in large diagonal branch. Left circumflex was normal.  25% proximal RCA lesion.  50% lesion in the PDA.    b.  PTCA of the first diagonal and  placement of three multi-link stents.    c.  recent ETT Cardiolite revealed low probability for ischemia.     d.  cardiac catheterization on 05/18/97 per [name] revealed 80% LAD, 70% proximal, 90% mid first AL, 30% RCA.     e.  status post coronary bypass grafting x 2 per [name].    f.  recurrent chest pain with exertion, equivocal ETT.    g.  repeat cardiac catheterization revealed an occluded vein graft.     h.  medical therapy.    i.  ETT Myoview showed borderline changes.  j.  cardiac catheterization 8/02 revealed no significant change in anatomy from previous cath.     k. cardiac cath 06/19/05 revealed patent left internal mammary artery to occluded LAD. Saphenous vein to D1 was chronically occ   Benign essential HTN 11/19/2015   Benign fibroma of prostate 03/05/2014   Cannot sleep 03/05/2014   Colon polyp 04/12/2012   COVID 08/2020   HLD (hyperlipidemia) 11/19/2015   Presence of artificial eye 04/12/2012   Spinal stenosis 04/12/2012   Type 2 diabetes mellitus ([place]) 03/05/2014    Surgical History:  Past Surgical History:  Procedure Laterality Date   COLONOSCOPY WITH PROPOFOL N/A 12/13/2017   Procedure: COLONOSCOPY WITH PROPOFOL;  Surgeon: [name], MD;  Location[facility] ENDOSCOPY;  Service: Endoscopy;  Laterality: N/A;   CORONARY ARTERY BYPASS GRAFT  1998   CORONARY STENT PLACEMENT  2006   JOINT REPLACEMENT Left    hip- 06/2019   left eye     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   aspirin EC 81 MG tablet Take 81  mg by mouth daily.    atenolol (TENORMIN) 50 MG tablet Take 1 tablet (50 mg total) by mouth daily.   clopidogrel (PLAVIX) 75 MG tablet Take 1 tablet (75 mg total) by mouth daily.   fenofibrate (TRICOR) 145 MG tablet Take 1 tablet (145 mg total) by mouth daily.   glipiZIDE (GLUCOTROL XL) 2.5 MG 24 hr tablet Take 2.5 mg by mouth daily.   insulin degludec (TRESIBA FLEXTOUCH) 100 UNIT/ML FlexTouch Pen Inject 70 Units into the skin daily.    metFORMIN (GLUCOPHAGE) 500 MG tablet TAKE 2 TABLETS TWICE DAILY WITH MEALS   neomycin-polymyxin-dexamethasone (MAXITROL) 0.1 % ophthalmic suspension Place 1 drop into the left eye 4 (four) times daily.   nitroGLYCERIN (NITROSTAT) 0.4 MG SL tablet Place 0.4 mg under the tongue every 5 (five) minutes as needed.   omega-3 acid ethyl esters (LOVAZA) 1 g capsule Take 2 capsules (2 g total) by mouth 2 (two) times daily.   ramipril (ALTACE) 1.25 MG capsule Take 1 capsule (1.25 mg total) by mouth daily.   vitamin B-12 (CYANOCOBALAMIN) 1000 MCG tablet Take 1,000 mcg by mouth daily.   No current facility-administered medications on file prior to visit.    Allergies:  Allergies  Allergen Reactions   Trulicity [Dulaglutide] Nausea Only   Penicillins Rash    Childhood. Did not require medical care. Childhood. Did not require medical care. Childhood. Did not require medical care.    Social History:  Social History   Socioeconomic History   Marital status: Married    Spouse name: Not on file   Number of children: Not on file   Years of education: Not on file   Highest education level: Not on file  Occupational History   Not on file  Tobacco Use   Smoking status: Former    Types: Cigarettes    Quit date: 1998    Years since quitting: 62.8  Smokeless tobacco: Never  Vaping Use   Vaping Use: Never used  Substance and Sexual Activity   Alcohol use: Yes    Alcohol/week: 6.0 standard drinks of alcohol    Types: 6 Cans of beer per week    Comment:  occasional   Drug use: No   Sexual activity: Yes  Other Topics Concern   Not on file  Social History Narrative   Not on file   Social Determinants of Health   Financial Resource Strain: Low Risk  (05/19/2022)   Overall Financial Resource Strain (CARDIA)    Difficulty of Paying Living Expenses: Not hard at all  Recent Concern: Financial Resource Strain - High Risk (04/27/2022)   Overall Financial Resource Strain (CARDIA)    Difficulty of Paying Living Expenses: Hard  Food Insecurity: No Food Insecurity (05/19/2022)   Hunger Vital Sign    Worried About Running Out of Food in the Last Year: Never true    Ran Out of Food in the Last Year: Never true  Transportation Needs: No Transportation Needs (05/19/2022)   PRAPARE - Hydrologist (Medical): No    Lack of Transportation (Non-Medical): No  Physical Activity: Inactive (05/19/2022)   Exercise Vital Sign    Days of Exercise per Week: 0 days    Minutes of Exercise per Session: 0 min  Stress: No Stress Concern Present (05/16/2021)   [place]    Feeling of Stress : Not at all  Social Connections: Moderately Isolated (05/19/2022)   Social Connection and Isolation Panel [NHANES]    Frequency of Communication with Friends and Family: Three times a week    Frequency of Social Gatherings with Friends and Family: Three times a week    Attends Religious Services: Never    Active Member of Clubs or Organizations: No    Attends Music therapist: Never    Marital Status: Married  Human resources officer Violence: Not on file   Social History   Tobacco Use  Smoking Status Former   Types: Cigarettes   Quit date: 1998   Years since quitting: 25.8  Smokeless Tobacco Never   Social History   Substance and Sexual Activity  Alcohol Use Yes   Alcohol/week: 6.0 standard drinks of alcohol   Types: 6 Cans of beer per week   Comment: occasional     Family History:  Family History  Problem Relation Age of Onset   Hypertension Brother    Diabetes Brother    Heart disease Mother    Heart disease Father    Diabetes Daughter    Schizophrenia Maternal Grandmother    Heart disease Brother    Prostate cancer Neg Hx    Bladder Cancer Neg Hx    Kidney cancer Neg Hx     Past medical history, surgical history, medications, allergies, family history and social history reviewed with patient today and changes made to appropriate areas of the chart.   ROS All other ROS negative except what is listed above and in the HPI.      Objective:    BP 131/67   Pulse (!) 59   Temp 98.5 F (36.9 C) (Oral)   Ht 6' 0.01" (1.829 m)   Wt 185 lb (83.9 kg)   SpO2 98%   BMI 25.08 kg/m   Wt Readings from Last 3 Encounters:  07/31/22 185 lb (83.9 kg)  05/21/22 178 lb 8 oz (81 kg)  04/09/22 177  lb 6.4 oz (80.5 kg)    Physical Exam Vitals and nursing note reviewed.  Constitutional:      General: He is awake. He is not in acute distress.    Appearance: He is well-developed and well-groomed. He is not ill-appearing or toxic-appearing.  HENT:     Head: Normocephalic and atraumatic.     Right Ear: Hearing, tympanic membrane, ear canal and external ear normal. No drainage.     Left Ear: Hearing, tympanic membrane, ear canal and external ear normal. No drainage.     Nose: Nose normal.     Mouth/Throat:     Pharynx: Uvula midline.  Eyes:     General: Lids are normal.        Right eye: No discharge.        Left eye: No discharge.     Extraocular Movements: Extraocular movements intact.     Conjunctiva/sclera: Conjunctivae normal.     Pupils: Pupils are equal, round, and reactive to light.     Visual Fields: Right eye visual fields normal and left eye visual fields normal.  Neck:     Thyroid: No thyromegaly.     Vascular: No carotid bruit or JVD.     Trachea: Trachea normal.  Cardiovascular:     Rate and Rhythm: Normal rate and regular  rhythm.     Heart sounds: Normal heart sounds, S1 normal and S2 normal. No murmur heard.    No gallop.  Pulmonary:     Effort: Pulmonary effort is normal. No accessory muscle usage or respiratory distress.     Breath sounds: Normal breath sounds.  Abdominal:     General: Bowel sounds are normal.     Palpations: Abdomen is soft. There is no hepatomegaly or splenomegaly.     Tenderness: There is no abdominal tenderness.  Musculoskeletal:        General: Normal range of motion.     Cervical back: Normal range of motion and neck supple.     Right lower leg: No edema.     Left lower leg: No edema.  Lymphadenopathy:     Head:     Right side of head: No submental, submandibular, tonsillar, preauricular or posterior auricular adenopathy.     Left side of head: No submental, submandibular, tonsillar, preauricular or posterior auricular adenopathy.     Cervical: No cervical adenopathy.  Skin:    General: Skin is warm and dry.     Capillary Refill: Capillary refill takes less than 2 seconds.     Findings: No rash.  Neurological:     Mental Status: He is alert and oriented to person, place, and time.     Gait: Gait is intact.     Deep Tendon Reflexes: Reflexes are normal and symmetric.     Reflex Scores:      Brachioradialis reflexes are 2+ on the right side and 2+ on the left side.      Patellar reflexes are 2+ on the right side and 2+ on the left side. Psychiatric:        Attention and Perception: Attention normal.        Mood and Affect: Mood normal.        Speech: Speech normal.        Behavior: Behavior normal. Behavior is cooperative.        Thought Content: Thought content normal.        Cognition and Memory: Cognition normal.       05/19/2022  9:14 AM 05/16/2021    9:08 AM 05/13/2020    9:04 AM  6CIT Screen  What Year?  0 points 0 points  What month?  0 points 0 points  What time? 0 points 0 points 0 points  Count back from 20 0 points 0 points 0 points  Months in reverse 0  points 0 points 0 points  Repeat phrase 0 points 4 points 2 points  Total Score  4 points 2 points   Results for orders placed or performed in visit on 04/17/22  Lipid Panel w/o Chol/HDL Ratio  Result Value Ref Range   Cholesterol, Total 186 100 - 199 mg/dL   Triglycerides 1,040 (HH) 0 - 149 mg/dL   HDL 26 (L) >39 mg/dL   VLDL Cholesterol Cal Comment (A) 5 - 40 mg/dL   LDL Chol Calc (NIH) Comment (A) 0 - 99 mg/dL  Basic metabolic panel  Result Value Ref Range   Glucose 269 (H) 70 - 99 mg/dL   BUN 14 8 - 27 mg/dL   Creatinine, Ser 1.17 0.76 - 1.27 mg/dL   eGFR 66 >59 mL/min/1.73   BUN/Creatinine Ratio 12 10 - 24   Sodium 136 134 - 144 mmol/L   Potassium 4.8 3.5 - 5.2 mmol/L   Chloride 99 96 - 106 mmol/L   CO2 17 (L) 20 - 29 mmol/L   Calcium 9.1 8.6 - 10.2 mg/dL      Assessment & Plan:   Problem List Items Addressed This Visit       Cardiovascular and Mediastinum   Hypertension associated with type 2 diabetes mellitus (HCC)    Chronic, stable.  BP below goal.  Will continue current medication regimen and cardiology collaboration.  Recommend he monitor BP at home at least a few mornings a week and document for provider + focus on DASH diet.  Could consider addition of Amlodipine if elevation in SBP noted.  Urine ALB 150 (January 2023), continue Ramipril for kidney protection.  LABS: CBC, CMP, TSH.  Return in 6 months.      Relevant Medications   rosuvastatin (CRESTOR) 40 MG tablet   Other Relevant Orders   Comprehensive metabolic panel   TSH     Digestive   GERD (gastroesophageal reflux disease)    Chronic, ongoing, stable with daily PPI.  Would benefit from trial reduction in future, discussed with patient as magnesium continues to be low.  At this time maintain and monitor.  Mag level today. Risks of PPI use were discussed with patient including bone loss, C. Diff diarrhea, pneumonia, infections, CKD, electrolyte abnormalities.  Verbalizes understanding and chooses to  continue the medication.       Relevant Medications   pantoprazole (PROTONIX) 20 MG tablet   Other Relevant Orders   Magnesium     Endocrine   CKD stage 3 due to type 2 diabetes mellitus ([place])    Ongoing, stable.  Labs today: CBC and CMP.  Urine ALB 150 on check (January 2023).  Continue Ramipril for kidney protection.  Consider nephrology referral if decline ever noted.  Most recent A1c 11.4% with endocrinology September 2023, continue this collaboration.        Relevant Medications   rosuvastatin (CRESTOR) 40 MG tablet   Other Relevant Orders   Comprehensive metabolic panel   Hyperlipidemia associated with type 2 diabetes mellitus (HCC)    Chronic, ongoing.  Continue Crestor, Lovaza, and Fenofibrate, tolerating at this time.  Adjust dose as needed.  Lipid panel today.  Insurance would not cover [place].      Relevant Medications   rosuvastatin (CRESTOR) 40 MG tablet   Other Relevant Orders   Comprehensive metabolic panel   Lipid Panel w/o Chol/HDL Ratio   Type 2 diabetes mellitus with hyperglycemia, with long-term current use of insulin (HCC)    Chronic, ongoing.  Followed by endocrinology with recent A1c 11.4% September 2023 and urine ALB 150 (January 2023) -- continue Ramipril for kidney protection.  Continue current medication regimen as prescribed by endocrinology, notes reviewed.  CCM collaboration continues -- may need help with cost of medications in future. Recommend he check BS at least twice a day, fasting and 2 hours after a meal.  Plan on 6 month follow-up as sees endocrinology.      Relevant Medications   rosuvastatin (CRESTOR) 40 MG tablet   Other Relevant Orders   Comprehensive metabolic panel   Type 2 diabetes mellitus with proteinuria ([place]) - Primary    Refer to CKD plan of care.      Relevant Medications   rosuvastatin (CRESTOR) 40 MG tablet   Other Relevant Orders   Comprehensive metabolic panel     Genitourinary   BPH (benign prostatic hyperplasia)     No current medications, check PSA today.      Relevant Orders   PSA     Other   Hypertriglyceridemia    Ongoing issue with statin, Fenofibrate, and Fish Oil on board, will recheck Lipid panel today and continue collaboration with cardiology.  Attempted to get Repatha recently but insurance denied this.       Relevant Medications   rosuvastatin (CRESTOR) 40 MG tablet   Other Relevant Orders   Comprehensive metabolic panel   Lipid Panel w/o Chol/HDL Ratio   Hypomagnesemia    Lows on labs and taking supplement.  Recheck today and adjust as needed.  Recommend cut back on Protonix use.      Relevant Orders   Magnesium   Insomnia    Chronic, ongoing.   Belsomra too costly.  Would avoid Ambien and benzo use due to age and BEERS.  Continue Amtriptyline 40 MG at night which has offered benefit to him. Continue to monitor and adjust regimen as needed.  Recommend sleep hygiene techniques.  Could consider therapy for CBT in future.        Vitamin B12 deficiency    Chronic, stable on recent recheck.  Continue daily supplement and recheck level today.      Relevant Orders   CBC with Differential/Platelet   Vitamin B12   Other Visit Diagnoses     Flu vaccine need       Flu vaccine in office today.   Relevant Orders   Flu Vaccine QUAD High Dose(Fluad) (Completed)   Encounter for annual physical exam       Annual physical today with labs and health maintenance reviewed, discussed with patient.       Discussed aspirin prophylaxis for myocardial infarction prevention and decision was made to continue ASA  LABORATORY TESTING:  Health maintenance labs ordered today as discussed above.   The natural history of prostate cancer and ongoing controversy regarding screening and potential treatment outcomes of prostate cancer has been discussed with the patient. The meaning of a false positive PSA and a false negative PSA has been discussed. He indicates understanding of the limitations of  this screening test and wishes to proceed with screening PSA testing although over 68.   IMMUNIZATIONS:   - Tdap: Tetanus  vaccination status reviewed: last tetanus booster within 10 years. - Influenza: Administered today - Pneumovax: Up to date - Prevnar: Up to date - Zostavax vaccine: Refused  SCREENING: - Colonoscopy: Up to date  Discussed with patient purpose of the colonoscopy is to detect colon cancer at curable precancerous or early stages   - AAA Screening: Not applicable  -Hearing Test: Not applicable  -Spirometry: Not applicable   PATIENT COUNSELING:    Sexuality: Discussed sexually transmitted diseases, partner selection, use of condoms, avoidance of unintended pregnancy  and contraceptive alternatives.   Advised to avoid cigarette smoking.  I discussed with the patient that most people either abstain from alcohol or drink within safe limits (<=14/week and <=4 drinks/occasion for males, <=7/weeks and <= 3 drinks/occasion for females) and that the risk for alcohol disorders and other health effects rises proportionally with the number of drinks per week and how often a drinker exceeds daily limits.  Discussed cessation/primary prevention of drug use and availability of treatment for abuse.   Diet: Encouraged to adjust caloric intake to maintain  or achieve ideal body weight, to reduce intake of dietary saturated fat and total fat, to limit sodium intake by avoiding high sodium foods and not adding table salt, and to maintain adequate dietary potassium and calcium preferably from fresh fruits, vegetables, and low-fat dairy products.    Stressed the importance of regular exercise  Injury prevention: Discussed safety belts, safety helmets, smoke detector, smoking near bedding or upholstery.   Dental health: Discussed importance of regular tooth brushing, flossing, and dental visits.   Follow up plan: NEXT PREVENTATIVE PHYSICAL DUE IN 1 YEAR. Return in about 6 months  (around 01/29/2023) for T2DM, HTN/HLD, INSOMNIA, HYPOMAGNESEMIA.

## 2022-07-31 NOTE — Assessment & Plan Note (Signed)
Chronic, stable on recent recheck.  Continue daily supplement and recheck level today.

## 2022-07-31 NOTE — Assessment & Plan Note (Signed)
Ongoing, stable.  Labs today: CBC and CMP.  Urine ALB 150 on check (January 2023).  Continue Ramipril for kidney protection.  Consider nephrology referral if decline ever noted.  Most recent A1c 11.4% with endocrinology September 2023, continue this collaboration.

## 2022-07-31 NOTE — Assessment & Plan Note (Signed)
Chronic, ongoing.  Followed by endocrinology with recent A1c 11.4% September 2023 and urine ALB 150 (January 2023) -- continue Ramipril for kidney protection.  Continue current medication regimen as prescribed by endocrinology, notes reviewed.  CCM collaboration continues -- may need help with cost of medications in future. Recommend he check BS at least twice a day, fasting and 2 hours after a meal.  Plan on 6 month follow-up as sees endocrinology.

## 2022-07-31 NOTE — Assessment & Plan Note (Signed)
Chronic, ongoing.   Belsomra too costly.  Would avoid Ambien and benzo use due to age and BEERS.  Continue Amtriptyline 40 MG at night which has offered benefit to him. Continue to monitor and adjust regimen as needed.  Recommend sleep hygiene techniques.  Could consider therapy for CBT in future.

## 2022-07-31 NOTE — Assessment & Plan Note (Signed)
Ongoing issue with statin, Fenofibrate, and Fish Oil on board, will recheck Lipid panel today and continue collaboration with cardiology.  Attempted to get Repatha recently but insurance denied this.

## 2022-07-31 NOTE — Assessment & Plan Note (Signed)
Refer to CKD plan of care.

## 2022-07-31 NOTE — Assessment & Plan Note (Signed)
Chronic, stable.  BP below goal.  Will continue current medication regimen and cardiology collaboration.  Recommend he monitor BP at home at least a few mornings a week and document for provider + focus on DASH diet.  Could consider addition of Amlodipine if elevation in SBP noted.  Urine ALB 150 (January 2023), continue Ramipril for kidney protection.  LABS: CBC, CMP, TSH.  Return in 6 months.

## 2022-07-31 NOTE — Assessment & Plan Note (Signed)
No current medications, check PSA today.

## 2022-07-31 NOTE — Assessment & Plan Note (Signed)
Lows on labs and taking supplement.  Recheck today and adjust as needed.  Recommend cut back on Protonix use.

## 2022-07-31 NOTE — Assessment & Plan Note (Signed)
Chronic, ongoing, stable with daily PPI.  Would benefit from trial reduction in future, discussed with patient as magnesium continues to be low.  At this time maintain and monitor.  Mag level today. Risks of PPI use were discussed with patient including bone loss, C. Diff diarrhea, pneumonia, infections, CKD, electrolyte abnormalities.  Verbalizes understanding and chooses to continue the medication.

## 2022-07-31 NOTE — Assessment & Plan Note (Signed)
Chronic, ongoing.  Continue Crestor, Lovaza, and Fenofibrate, tolerating at this time.  Adjust dose as needed.  Lipid panel today.  Insurance would not cover [place].

## 2022-08-01 LAB — COMPREHENSIVE METABOLIC PANEL
ALT: 15 IU/L (ref 0–44)
AST: 13 IU/L (ref 0–40)
Albumin/Globulin Ratio: 2 (ref 1.2–2.2)
Albumin: 4.4 g/dL (ref 3.8–4.8)
Alkaline Phosphatase: 60 IU/L (ref 44–121)
BUN/Creatinine Ratio: 14 (ref 10–24)
BUN: 20 mg/dL (ref 8–27)
Bilirubin Total: 0.3 mg/dL (ref 0.0–1.2)
CO2: 22 mmol/L (ref 20–29)
Calcium: 10.4 mg/dL — ABNORMAL HIGH (ref 8.6–10.2)
Chloride: 102 mmol/L (ref 96–106)
Creatinine, Ser: 1.45 mg/dL — ABNORMAL HIGH (ref 0.76–1.27)
Globulin, Total: 2.2 g/dL (ref 1.5–4.5)
Glucose: 107 mg/dL — ABNORMAL HIGH (ref 70–99)
Potassium: 4.8 mmol/L (ref 3.5–5.2)
Sodium: 137 mmol/L (ref 134–144)
Total Protein: 6.6 g/dL (ref 6.0–8.5)
eGFR: 51 mL/min/{1.73_m2} — ABNORMAL LOW (ref >59)

## 2022-08-01 LAB — CBC WITH DIFFERENTIAL/PLATELET
Basophils Absolute: 0 10*3/uL (ref 0.0–0.2)
Basos: 0 %
EOS (ABSOLUTE): 0.3 10*3/uL (ref 0.0–0.4)
Eos: 5 %
Hematocrit: 43.6 % (ref 37.5–51.0)
Hemoglobin: 14.8 g/dL (ref 13.0–17.7)
Immature Grans (Abs): 0 10*3/uL (ref 0.0–0.1)
Immature Granulocytes: 0 %
Lymphocytes Absolute: 1.7 10*3/uL (ref 0.7–3.1)
Lymphs: 34 %
MCH: 30.5 pg (ref 26.6–33.0)
MCHC: 33.9 g/dL (ref 31.5–35.7)
MCV: 90 fL (ref 79–97)
Monocytes Absolute: 0.4 10*3/uL (ref 0.1–0.9)
Monocytes: 7 %
Neutrophils Absolute: 2.7 10*3/uL (ref 1.4–7.0)
Neutrophils: 54 %
Platelets: 224 10*3/uL (ref 150–450)
RBC: 4.85 x10E6/uL (ref 4.14–5.80)
RDW: 13.4 % (ref 11.6–15.4)
WBC: 5.1 10*3/uL (ref 3.4–10.8)

## 2022-08-01 LAB — LIPID PANEL W/O CHOL/HDL RATIO
Cholesterol, Total: 309 mg/dL — ABNORMAL HIGH (ref 100–199)
HDL: 32 mg/dL — ABNORMAL LOW (ref >39)
Triglycerides: 980 mg/dL (ref 0–149)

## 2022-08-01 LAB — MAGNESIUM: Magnesium: 1.6 mg/dL (ref 1.6–2.3)

## 2022-08-01 LAB — VITAMIN B12: Vitamin B-12: 434 pg/mL (ref 232–1245)

## 2022-08-01 LAB — PSA: Prostate Specific Ag, Serum: 0.6 ng/mL (ref 0.0–4.0)

## 2022-08-01 LAB — TSH: TSH: 0.801 u[IU]/mL (ref 0.450–4.500)

## 2022-08-02 NOTE — Progress Notes (Signed)
Contacted via MyChart -- please schedule early morning fasting labs in 3 weeks for Mr. Edds please.  Good morning Kenna, your labs have returned and cholesterol labs continue to show elevation in triglycerides -- I would like to recheck these in early morning with you fasting in 3 weeks please to ensure accurate numbers.  My staff will call to schedule.  If these remain elevated on recheck then I will try again to get injectable, either Repatha or Praluent coverage. - Kidney function, creatinine and eGFR, is showing mild decrease again on these labs.  Ensure good water intake at home and we will continue to work on diabetes control. - Remainder of labs are stable, including magnesium this check.  Continue supplements = B12 and magnesium.  Any questions? Keep being amazing!!  Thank you for allowing me to participate in your care.  I appreciate you. Kindest regards, Dorothy Landgrebe

## 2022-08-14 ENCOUNTER — Other Ambulatory Visit: Payer: Medicare HMO

## 2022-08-14 DIAGNOSIS — E782 Mixed hyperlipidemia: Secondary | ICD-10-CM | POA: Diagnosis not present

## 2022-08-15 LAB — LIPID PANEL
Chol/HDL Ratio: 6.7 ratio — ABNORMAL HIGH (ref 0.0–5.0)
Cholesterol, Total: 175 mg/dL (ref 100–199)
HDL: 26 mg/dL — ABNORMAL LOW (ref >39)
LDL Chol Calc (NIH): 63 mg/dL (ref 0–99)
Triglycerides: 566 mg/dL (ref 0–149)
VLDL Cholesterol Cal: 86 mg/dL — ABNORMAL HIGH (ref 5–40)

## 2022-08-16 NOTE — Progress Notes (Signed)
Contacted via [place] morning  Devere, your labs have returned.  Triglycerides have improved from 980 to 566 with you fasting.  Still above goal.  I recommend we continue Fenofibrate, Lovaza, and Rosuvastatin daily -- ensure to not miss doses.  Also add more fish and chicken to diet + avoid alcohol.  We will recheck next visit and if ongoing high levels with fasting we will try again to get an injectable like Repatha covered.  Any questions? Keep being amazing!!  Thank you for allowing me to participate in your care.  I appreciate you. Kindest regards, Francina Beery

## 2022-08-17 ENCOUNTER — Telehealth: Payer: Self-pay

## 2022-08-17 NOTE — Progress Notes (Signed)
Chronic Care Management Pharmacy Assistant   Name: [name]  MRN: [id number] DOB: 1951/04/15   Reason for Encounter: Disease State   Conditions to be addressed/monitored: DMII  Recent office visits:  07/31/22 Brandon Lick, NP (Annual Exam) Orders placed: Labs, Medication changes: rosuvastatin 40 mg, amitriptyline 40 mg  Recent consult visits:  None since last coordination call  Hospital visits:  None in previous 6 months  Medications: Outpatient Encounter Medications as of 08/17/2022  Medication Sig   ACCU-CHEK GUIDE test strip 1 each by Other route 2 times daily at 12 noon and 4 pm.   amitriptyline (ELAVIL) 10 MG tablet Take 4 tablets (40 mg total) by mouth at bedtime.   aspirin EC 81 MG tablet Take 81 mg by mouth daily.    atenolol (TENORMIN) 50 MG tablet Take 1 tablet (50 mg total) by mouth daily.   clopidogrel (PLAVIX) 75 MG tablet Take 1 tablet (75 mg total) by mouth daily.   fenofibrate (TRICOR) 145 MG tablet Take 1 tablet (145 mg total) by mouth daily.   glipiZIDE (GLUCOTROL XL) 2.5 MG 24 hr tablet Take 2.5 mg by mouth daily.   insulin degludec (TRESIBA FLEXTOUCH) 100 UNIT/ML FlexTouch Pen Inject 70 Units into the skin daily.    metFORMIN (GLUCOPHAGE) 500 MG tablet TAKE 2 TABLETS TWICE DAILY WITH MEALS   neomycin-polymyxin-dexamethasone (MAXITROL) 0.1 % ophthalmic suspension Place 1 drop into the left eye 4 (four) times daily.   nitroGLYCERIN (NITROSTAT) 0.4 MG SL tablet Place 0.4 mg under the tongue every 5 (five) minutes as needed.   omega-3 acid ethyl esters (LOVAZA) 1 g capsule Take 2 capsules (2 g total) by mouth 2 (two) times daily.   pantoprazole (PROTONIX) 20 MG tablet TAKE 1 TABLET EVERY DAY (DISCONTINUE '40MG'$  DOSE)   ramipril (ALTACE) 1.25 MG capsule Take 1 capsule (1.25 mg total) by mouth daily.   rosuvastatin (CRESTOR) 40 MG tablet Take 1 tablet (40 mg total) by mouth daily.   vitamin B-12 (CYANOCOBALAMIN) 1000 MCG tablet Take 1,000 mcg by  mouth daily.   No facility-administered encounter medications on file as of 08/17/2022.   Recent Relevant Labs: Lab Results  Component Value Date/Time   HGBA1C 7.2 11/06/2019 12:00 AM   MICROALBUR 150 (H) 10/10/2021 10:25 AM   MICROALBUR 30 (H) 12/10/2020 01:57 PM    Kidney Function Lab Results  Component Value Date/Time   CREATININE 1.45 (H) 07/31/2022 03:05 PM   CREATININE 1.17 04/17/2022 08:35 AM   CREATININE 1.10 09/20/2014 09:23 AM   CREATININE 0.96 08/16/2014 10:25 AM   GFRNONAA 64 09/03/2020 08:26 AM   GFRNONAA >60 09/20/2014 09:23 AM   GFRNONAA >60 05/29/2014 12:42 PM   GFRAA 74 09/03/2020 08:26 AM   GFRAA >60 09/20/2014 09:23 AM   GFRAA >60 05/29/2014 12:42 PM    Current antihyperglycemic regimen:  Glipizide 2.5 mg (Pt states increased to 10 mg) [name] 100 mg (Pt states increased to 80 units) Metformin 500 mg  What recent interventions/DTPs have been made to improve glycemic control:  continue Ramipril for kidney protection. Continue current medication regimen as prescribed by endocrinology, per Brandon Guarneri, NP 07/31/22  Have there been any recent hospitalizations or ED visits since last visit with CPP? No  Patient denies hypoglycemic symptoms, including None  Patient denies hyperglycemic symptoms, including none  How often are you checking your blood sugar? once daily  What are your blood sugars ranging? 160-190 Fasting: This morning 189, yesterday 162 and 180 on 12/17  During the week, how often does your blood glucose drop below 70? Never  Are you checking your feet daily/regularly? No issues with swelling, redness or open sores.  Adherence Review: Is the patient currently on a STATIN medication? Yes Is the patient currently on ACE/ARB medication? No Does the patient have >5 day gap between last estimated fill dates? No    Care Gaps: Colonoscopy-12/13/17 Diabetic Foot Exam-04/09/22 Ophthalmology-12/20/20 Dexa Scan - NA Annual Well Visit -  07/31/22 Micro albumin-10/10/21 Hemoglobin A1c- 04/23/22 11.4  Star Rating Drugs: Metformin 500 mg-last fill 07/02/22 90 ds, 04/25/22 90 ds Ramipril 1.25 mg-last fill 07/02/22 90 ds, 04/25/22 90 ds  [place] [phone]

## 2022-08-21 DIAGNOSIS — E785 Hyperlipidemia, unspecified: Secondary | ICD-10-CM | POA: Diagnosis not present

## 2022-08-21 DIAGNOSIS — E1159 Type 2 diabetes mellitus with other circulatory complications: Secondary | ICD-10-CM | POA: Diagnosis not present

## 2022-08-21 DIAGNOSIS — E1169 Type 2 diabetes mellitus with other specified complication: Secondary | ICD-10-CM | POA: Diagnosis not present

## 2022-08-21 DIAGNOSIS — E1165 Type 2 diabetes mellitus with hyperglycemia: Secondary | ICD-10-CM | POA: Diagnosis not present

## 2022-08-21 DIAGNOSIS — I152 Hypertension secondary to endocrine disorders: Secondary | ICD-10-CM | POA: Diagnosis not present

## 2022-08-21 DIAGNOSIS — Z794 Long term (current) use of insulin: Secondary | ICD-10-CM | POA: Diagnosis not present

## 2022-10-26 ENCOUNTER — Ambulatory Visit (INDEPENDENT_AMBULATORY_CARE_PROVIDER_SITE_OTHER): Payer: Medicare PPO | Admitting: Nurse Practitioner

## 2022-10-26 ENCOUNTER — Encounter: Payer: Self-pay | Admitting: Nurse Practitioner

## 2022-10-26 VITALS — BP 145/75 | HR 65 | Temp 97.7°F | Ht 72.01 in | Wt 185.6 lb

## 2022-10-26 DIAGNOSIS — R0981 Nasal congestion: Secondary | ICD-10-CM | POA: Diagnosis not present

## 2022-10-26 MED ORDER — BENZONATATE 100 MG PO CAPS: 100.0000 mg | ORAL_CAPSULE | Freq: Three times a day (TID) | ORAL | 0 refills | Status: DC | PRN

## 2022-10-26 MED ORDER — NIRMATRELVIR/RITONAVIR (PAXLOVID) TABLET (RENAL DOSING): 2.0000 | ORAL_TABLET | Freq: Two times a day (BID) | ORAL | 0 refills | Status: AC

## 2022-10-26 NOTE — Assessment & Plan Note (Signed)
Acute for 4 days with recent direct Covid exposure for prolonged period.  At this time will obtain swab for testing and start Paxlovid due to concerns for possible Covid based on symptoms and exposure + higher risk due to T2DM.  Renal dosed Paxlovid sent in and educated patient on this medication.  To self quarantine for total of 5 days since symptoms started and then mask for 5 days.  Tessalon sent in for cough.  Recommend: - Increased rest - Increasing Fluids - Acetaminophen as needed for fever/pain.  - Salt water gargling, chloraseptic spray and throat lozenges - OTC Coricidin - Mucinex.  - Humidifying the air.  Return in one week for lung check.

## 2022-10-26 NOTE — Progress Notes (Signed)
BP (!) 145/75   Pulse 65   Temp 97.7 F (36.5 C) (Oral)   Ht 6' 0.01" (1.829 m)   Wt 185 lb 9.6 oz (84.2 kg)   SpO2 97%   BMI 25.17 kg/m    Subjective:    Patient ID: [name], male    DOB: 11/16/1950, 72 y.o.   MRN: [id number]  HPI: [name] is a 72 y.o. male  Chief Complaint  Patient presents with   Cough    B/L ear pain, sore throat started on Friday   Covid Exposure    Exposed on Thursday   UPPER RESPIRATORY TRACT INFECTION Exposed to Covid on Thursday and then started with symptoms on Friday = ear pain and sore throat.  Does maintenance work in apartment complex, was exposed via this.  Has had some Covid vaccinations.  Has not tested at home. Fever: yes Cough: yes Shortness of breath:  improved Wheezing: no Chest pain: no Chest tightness: no Chest congestion: yes Nasal congestion: yes Runny nose: yes Post nasal drip: yes Sneezing: no Sore throat: yes Swollen glands: no Sinus pressure: no Headache: no Face pain: no Toothache: no Ear pain: yes bilateral short periods of this Ear pressure: yes bilateral Eyes red/itching:no Eye drainage/crusting: no  Vomiting: no Rash: no Fatigue: yes Sick contacts: yes Strep contacts: no  Context: fluctuating Recurrent sinusitis: no Relief with OTC cold/cough medications: yes  Treatments attempted: Alka Seltzer Cold and Afrin nasal spray, Coricidin  Relevant past medical, surgical, family and social history reviewed and updated as indicated. Interim medical history since our last visit reviewed. Allergies and medications reviewed and updated.  Review of Systems  Constitutional:  Positive for fatigue and fever. Negative for activity change, appetite change and chills.  HENT:  Positive for congestion, ear pain, postnasal drip, rhinorrhea, sinus pressure and sore throat. Negative for ear discharge, sinus pain and voice change.   Respiratory:  Positive for cough. Negative for chest tightness, shortness  of breath and wheezing.   Cardiovascular:  Negative for chest pain, palpitations and leg swelling.  Gastrointestinal: Negative.   Neurological: Negative.   Psychiatric/Behavioral: Negative.      Per HPI unless specifically indicated above     Objective:    BP (!) 145/75   Pulse 65   Temp 97.7 F (36.5 C) (Oral)   Ht 6' 0.01" (1.829 m)   Wt 185 lb 9.6 oz (84.2 kg)   SpO2 97%   BMI 25.17 kg/m   Wt Readings from Last 3 Encounters:  10/26/22 185 lb 9.6 oz (84.2 kg)  07/31/22 185 lb (83.9 kg)  05/21/22 178 lb 8 oz (81 kg)    Physical Exam Vitals and nursing note reviewed.  Constitutional:      General: He is awake. He is not in acute distress.    Appearance: He is well-developed and well-groomed. He is not ill-appearing or toxic-appearing.  HENT:     Head: Normocephalic.     Right Ear: Hearing, ear canal and external ear normal. No tenderness. A middle ear effusion is present. Tympanic membrane is not injected or perforated.     Left Ear: Hearing, ear canal and external ear normal. No tenderness. A middle ear effusion is present. Tympanic membrane is not injected or perforated.     Nose: Rhinorrhea present. Rhinorrhea is clear.     Right Sinus: No maxillary sinus tenderness or frontal sinus tenderness.     Left Sinus: No maxillary sinus tenderness or frontal sinus tenderness.  Mouth/Throat:     Mouth: Mucous membranes are moist.     Pharynx: Posterior oropharyngeal erythema (mild) present. No pharyngeal swelling or oropharyngeal exudate.  Eyes:     General: Lids are normal.     Extraocular Movements: Extraocular movements intact.     Conjunctiva/sclera: Conjunctivae normal.  Neck:     Thyroid: No thyromegaly.     Vascular: No carotid bruit.  Cardiovascular:     Rate and Rhythm: Normal rate and regular rhythm.     Heart sounds: Normal heart sounds.  Pulmonary:     Effort: No accessory muscle usage or respiratory distress.     Breath sounds: Normal breath sounds.   Abdominal:     General: Bowel sounds are normal. There is no distension.     Palpations: Abdomen is soft.     Tenderness: There is no abdominal tenderness.  Musculoskeletal:     Cervical back: Full passive range of motion without pain.     Right lower leg: No edema.     Left lower leg: No edema.  Lymphadenopathy:     Cervical: No cervical adenopathy.  Skin:    General: Skin is warm.     Capillary Refill: Capillary refill takes less than 2 seconds.  Neurological:     Mental Status: He is alert and oriented to person, place, and time.     Deep Tendon Reflexes: Reflexes are normal and symmetric.     Reflex Scores:      Brachioradialis reflexes are 2+ on the right side and 2+ on the left side.      Patellar reflexes are 2+ on the right side and 2+ on the left side. Psychiatric:        Attention and Perception: Attention normal.        Mood and Affect: Mood normal.        Speech: Speech normal.        Behavior: Behavior normal. Behavior is cooperative.        Thought Content: Thought content normal.     Results for orders placed or performed in visit on 08/14/22  Lipid Profile  Result Value Ref Range   Cholesterol, Total 175 100 - 199 mg/dL   Triglycerides 566 (HH) 0 - 149 mg/dL   HDL 26 (L) >39 mg/dL   VLDL Cholesterol Cal 86 (H) 5 - 40 mg/dL   LDL Chol Calc (NIH) 63 0 - 99 mg/dL   Chol/HDL Ratio 6.7 (H) 0.0 - 5.0 ratio      Assessment & Plan:   Problem List Items Addressed This Visit       Respiratory   Sinus congestion - Primary    Acute for 4 days with recent direct Covid exposure for prolonged period.  At this time will obtain swab for testing and start Paxlovid due to concerns for possible Covid based on symptoms and exposure + higher risk due to T2DM.  Renal dosed Paxlovid sent in and educated patient on this medication.  To self quarantine for total of 5 days since symptoms started and then mask for 5 days.  Tessalon sent in for cough.  Recommend: - Increased  rest - Increasing Fluids - Acetaminophen as needed for fever/pain.  - Salt water gargling, chloraseptic spray and throat lozenges - OTC Coricidin - Mucinex.  - Humidifying the air.  Return in one week for lung check.      Relevant Orders   COVID-19, Flu A+B and RSV     Follow up plan:  Return in about 1 week (around 11/02/2022) for Cough.

## 2022-10-26 NOTE — Patient Instructions (Signed)

## 2022-10-28 LAB — COVID-19, FLU A+B AND RSV
Influenza A, NAA: NOT DETECTED
Influenza B, NAA: NOT DETECTED
RSV, NAA: NOT DETECTED
SARS-CoV-2, NAA: NOT DETECTED

## 2022-10-28 NOTE — Progress Notes (Signed)
Contacted via MyChart   Good morning [name], your respiratory panel came back negative.  For now you can stop the Paxlovid.  Any questions?

## 2022-11-01 NOTE — Patient Instructions (Incomplete)

## 2022-11-06 ENCOUNTER — Ambulatory Visit: Payer: Medicare PPO | Admitting: Nurse Practitioner

## 2022-11-11 ENCOUNTER — Other Ambulatory Visit: Payer: Self-pay

## 2022-11-11 ENCOUNTER — Emergency Department: Payer: Medicare PPO

## 2022-11-11 ENCOUNTER — Ambulatory Visit
Admission: EM | Admit: 2022-11-11 | Discharge: 2022-11-11 | Disposition: A | Discharge status: Other Acute Inpatient Hospital | Payer: Medicare PPO | Source: Home / Self Care

## 2022-11-11 ENCOUNTER — Encounter: Payer: Self-pay | Admitting: Emergency Medicine

## 2022-11-11 ENCOUNTER — Inpatient Hospital Stay
Admission: EM | Admit: 2022-11-11 | Discharge: 2022-11-13 | DRG: 194 | Disposition: A | Discharge status: Home / Self Care | Payer: Medicare PPO | Attending: Internal Medicine | Admitting: Internal Medicine

## 2022-11-11 ENCOUNTER — Ambulatory Visit (INDEPENDENT_AMBULATORY_CARE_PROVIDER_SITE_OTHER): Payer: Medicare PPO

## 2022-11-11 DIAGNOSIS — E871 Hypo-osmolality and hyponatremia: Secondary | ICD-10-CM | POA: Diagnosis present

## 2022-11-11 DIAGNOSIS — Z79899 Other long term (current) drug therapy: Secondary | ICD-10-CM | POA: Diagnosis not present

## 2022-11-11 DIAGNOSIS — J189 Pneumonia, unspecified organism: Secondary | ICD-10-CM | POA: Diagnosis present

## 2022-11-11 DIAGNOSIS — Z87891 Personal history of nicotine dependence: Secondary | ICD-10-CM

## 2022-11-11 DIAGNOSIS — E860 Dehydration: Secondary | ICD-10-CM

## 2022-11-11 DIAGNOSIS — Z955 Presence of coronary angioplasty implant and graft: Secondary | ICD-10-CM

## 2022-11-11 DIAGNOSIS — N179 Acute kidney failure, unspecified: Secondary | ICD-10-CM

## 2022-11-11 DIAGNOSIS — Z833 Family history of diabetes mellitus: Secondary | ICD-10-CM

## 2022-11-11 DIAGNOSIS — K219 Gastro-esophageal reflux disease without esophagitis: Secondary | ICD-10-CM | POA: Diagnosis present

## 2022-11-11 DIAGNOSIS — E785 Hyperlipidemia, unspecified: Secondary | ICD-10-CM | POA: Diagnosis present

## 2022-11-11 DIAGNOSIS — Z7984 Long term (current) use of oral hypoglycemic drugs: Secondary | ICD-10-CM

## 2022-11-11 DIAGNOSIS — M4800 Spinal stenosis, site unspecified: Secondary | ICD-10-CM | POA: Diagnosis present

## 2022-11-11 DIAGNOSIS — N400 Enlarged prostate without lower urinary tract symptoms: Secondary | ICD-10-CM | POA: Diagnosis present

## 2022-11-11 DIAGNOSIS — E878 Other disorders of electrolyte and fluid balance, not elsewhere classified: Secondary | ICD-10-CM | POA: Diagnosis present

## 2022-11-11 DIAGNOSIS — Z951 Presence of aortocoronary bypass graft: Secondary | ICD-10-CM

## 2022-11-11 DIAGNOSIS — N1831 Chronic kidney disease, stage 3a: Secondary | ICD-10-CM | POA: Diagnosis present

## 2022-11-11 DIAGNOSIS — I129 Hypertensive chronic kidney disease with stage 1 through stage 4 chronic kidney disease, or unspecified chronic kidney disease: Secondary | ICD-10-CM | POA: Diagnosis present

## 2022-11-11 DIAGNOSIS — R809 Proteinuria, unspecified: Secondary | ICD-10-CM | POA: Diagnosis present

## 2022-11-11 DIAGNOSIS — Z880 Allergy status to penicillin: Secondary | ICD-10-CM

## 2022-11-11 DIAGNOSIS — R197 Diarrhea, unspecified: Secondary | ICD-10-CM | POA: Diagnosis present

## 2022-11-11 DIAGNOSIS — Z794 Long term (current) use of insulin: Secondary | ICD-10-CM | POA: Diagnosis not present

## 2022-11-11 DIAGNOSIS — Z7902 Long term (current) use of antithrombotics/antiplatelets: Secondary | ICD-10-CM

## 2022-11-11 DIAGNOSIS — R059 Cough, unspecified: Secondary | ICD-10-CM | POA: Diagnosis present

## 2022-11-11 DIAGNOSIS — E1122 Type 2 diabetes mellitus with diabetic chronic kidney disease: Secondary | ICD-10-CM | POA: Diagnosis present

## 2022-11-11 DIAGNOSIS — R112 Nausea with vomiting, unspecified: Secondary | ICD-10-CM | POA: Diagnosis not present

## 2022-11-11 DIAGNOSIS — Z888 Allergy status to other drugs, medicaments and biological substances status: Secondary | ICD-10-CM

## 2022-11-11 DIAGNOSIS — I2510 Atherosclerotic heart disease of native coronary artery without angina pectoris: Secondary | ICD-10-CM | POA: Diagnosis present

## 2022-11-11 DIAGNOSIS — Z8249 Family history of ischemic heart disease and other diseases of the circulatory system: Secondary | ICD-10-CM

## 2022-11-11 DIAGNOSIS — Z86010 Personal history of colonic polyps: Secondary | ICD-10-CM

## 2022-11-11 DIAGNOSIS — Z8616 Personal history of COVID-19: Secondary | ICD-10-CM

## 2022-11-11 DIAGNOSIS — E1169 Type 2 diabetes mellitus with other specified complication: Secondary | ICD-10-CM | POA: Diagnosis present

## 2022-11-11 DIAGNOSIS — E1129 Type 2 diabetes mellitus with other diabetic kidney complication: Secondary | ICD-10-CM | POA: Diagnosis present

## 2022-11-11 DIAGNOSIS — J9859 Other diseases of mediastinum, not elsewhere classified: Secondary | ICD-10-CM

## 2022-11-11 DIAGNOSIS — E1165 Type 2 diabetes mellitus with hyperglycemia: Secondary | ICD-10-CM | POA: Diagnosis present

## 2022-11-11 DIAGNOSIS — Z7982 Long term (current) use of aspirin: Secondary | ICD-10-CM | POA: Diagnosis not present

## 2022-11-11 DIAGNOSIS — N183 Chronic kidney disease, stage 3 unspecified: Secondary | ICD-10-CM | POA: Diagnosis not present

## 2022-11-11 DIAGNOSIS — Z1152 Encounter for screening for COVID-19: Secondary | ICD-10-CM

## 2022-11-11 DIAGNOSIS — E1159 Type 2 diabetes mellitus with other circulatory complications: Secondary | ICD-10-CM | POA: Diagnosis present

## 2022-11-11 DIAGNOSIS — I152 Hypertension secondary to endocrine disorders: Secondary | ICD-10-CM | POA: Diagnosis present

## 2022-11-11 LAB — COMPREHENSIVE METABOLIC PANEL
ALT: 31 U/L (ref 0–44)
AST: 26 U/L (ref 15–41)
Albumin: 3.9 g/dL (ref 3.5–5.0)
Alkaline Phosphatase: 54 U/L (ref 38–126)
Anion gap: 12 (ref 5–15)
BUN: 49 mg/dL — ABNORMAL HIGH (ref 8–23)
CO2: 19 mmol/L — ABNORMAL LOW (ref 22–32)
Calcium: 8.2 mg/dL — ABNORMAL LOW (ref 8.9–10.3)
Chloride: 101 mmol/L (ref 98–111)
Creatinine, Ser: 3.25 mg/dL — ABNORMAL HIGH (ref 0.61–1.24)
GFR, Estimated: 19 mL/min — ABNORMAL LOW (ref >60)
Glucose, Bld: 156 mg/dL — ABNORMAL HIGH (ref 70–99)
Potassium: 3.8 mmol/L (ref 3.5–5.1)
Sodium: 132 mmol/L — ABNORMAL LOW (ref 135–145)
Total Bilirubin: 0.4 mg/dL (ref 0.3–1.2)
Total Protein: 7.9 g/dL (ref 6.5–8.1)

## 2022-11-11 LAB — CBC
HCT: 47.5 % (ref 39.0–52.0)
Hemoglobin: 15.2 g/dL (ref 13.0–17.0)
MCH: 29.2 pg (ref 26.0–34.0)
MCHC: 32 g/dL (ref 30.0–36.0)
MCV: 91.2 fL (ref 80.0–100.0)
Platelets: 330 10*3/uL (ref 150–400)
RBC: 5.21 MIL/uL (ref 4.22–5.81)
RDW: 13.1 % (ref 11.5–15.5)
WBC: 5.8 10*3/uL (ref 4.0–10.5)
nRBC: 0 % (ref 0.0–0.2)

## 2022-11-11 LAB — CBC WITH DIFFERENTIAL/PLATELET
Abs Immature Granulocytes: 0.05 10*3/uL (ref 0.00–0.07)
Basophils Absolute: 0 10*3/uL (ref 0.0–0.1)
Basophils Relative: 0 %
Eosinophils Absolute: 0.2 10*3/uL (ref 0.0–0.5)
Eosinophils Relative: 3 %
HCT: 48.8 % (ref 39.0–52.0)
Hemoglobin: 16.6 g/dL (ref 13.0–17.0)
Immature Granulocytes: 1 %
Lymphocytes Relative: 19 %
Lymphs Abs: 1.3 10*3/uL (ref 0.7–4.0)
MCH: 30.5 pg (ref 26.0–34.0)
MCHC: 34 g/dL (ref 30.0–36.0)
MCV: 89.5 fL (ref 80.0–100.0)
Monocytes Absolute: 0.6 10*3/uL (ref 0.1–1.0)
Monocytes Relative: 8 %
Neutro Abs: 4.8 10*3/uL (ref 1.7–7.7)
Neutrophils Relative %: 69 %
Platelets: 365 10*3/uL (ref 150–400)
RBC: 5.45 MIL/uL (ref 4.22–5.81)
RDW: 13.4 % (ref 11.5–15.5)
WBC: 6.9 10*3/uL (ref 4.0–10.5)
nRBC: 0 % (ref 0.0–0.2)

## 2022-11-11 LAB — BASIC METABOLIC PANEL
Anion gap: 9 (ref 5–15)
BUN: 49 mg/dL — ABNORMAL HIGH (ref 8–23)
CO2: 19 mmol/L — ABNORMAL LOW (ref 22–32)
Calcium: 8.1 mg/dL — ABNORMAL LOW (ref 8.9–10.3)
Chloride: 106 mmol/L (ref 98–111)
Creatinine, Ser: 2.8 mg/dL — ABNORMAL HIGH (ref 0.61–1.24)
GFR, Estimated: 23 mL/min — ABNORMAL LOW (ref >60)
Glucose, Bld: 160 mg/dL — ABNORMAL HIGH (ref 70–99)
Potassium: 4.1 mmol/L (ref 3.5–5.1)
Sodium: 134 mmol/L — ABNORMAL LOW (ref 135–145)

## 2022-11-11 LAB — HEPATIC FUNCTION PANEL
ALT: 27 U/L (ref 0–44)
AST: 23 U/L (ref 15–41)
Albumin: 3.5 g/dL (ref 3.5–5.0)
Alkaline Phosphatase: 48 U/L (ref 38–126)
Bilirubin, Direct: <0.1 mg/dL (ref 0.0–0.2)
Total Bilirubin: 0.3 mg/dL (ref 0.3–1.2)
Total Protein: 6.8 g/dL (ref 6.5–8.1)

## 2022-11-11 LAB — CREATININE, SERUM
Creatinine, Ser: 2.48 mg/dL — ABNORMAL HIGH (ref 0.61–1.24)
GFR, Estimated: 27 mL/min — ABNORMAL LOW (ref >60)

## 2022-11-11 LAB — RESP PANEL BY RT-PCR (RSV, FLU A&B, COVID)  RVPGX2
Influenza A by PCR: NEGATIVE
Influenza B by PCR: NEGATIVE
Resp Syncytial Virus by PCR: NEGATIVE
SARS Coronavirus 2 by RT PCR: NEGATIVE

## 2022-11-11 LAB — C DIFFICILE QUICK SCREEN W PCR REFLEX
C Diff antigen: NEGATIVE
C Diff interpretation: NOT DETECTED
C Diff toxin: NEGATIVE

## 2022-11-11 LAB — LIPASE, BLOOD: Lipase: 39 U/L (ref 11–51)

## 2022-11-11 LAB — TROPONIN I (HIGH SENSITIVITY)
Troponin I (High Sensitivity): 8 ng/L (ref <18)
Troponin I (High Sensitivity): 6 ng/L (ref <18)

## 2022-11-11 LAB — PROCALCITONIN: Procalcitonin: 0.84 ng/mL

## 2022-11-11 MED ORDER — ACETAMINOPHEN 325 MG PO TABS: 650.0000 mg | ORAL_TABLET | Freq: Four times a day (QID) | ORAL | Status: DC | PRN

## 2022-11-11 MED ORDER — HYDROCODONE-ACETAMINOPHEN 5-325 MG PO TABS: 1.0000 | ORAL_TABLET | ORAL | Status: DC | PRN

## 2022-11-11 MED ORDER — SODIUM CHLORIDE 0.9 % IV SOLN: INTRAVENOUS | Status: DC

## 2022-11-11 MED ORDER — INSULIN ASPART 100 UNIT/ML IJ SOLN
0.0000 [IU] | Freq: Three times a day (TID) | INTRAMUSCULAR | Status: DC
  Administered 2022-11-12 (×2): 3 [IU] via SUBCUTANEOUS
  Administered 2022-11-13: 2 [IU] via SUBCUTANEOUS
  Filled 2022-11-11 (×3): qty 1

## 2022-11-11 MED ORDER — POLYETHYLENE GLYCOL 3350 17 G PO PACK: 17.0000 g | PACK | Freq: Every day | ORAL | Status: DC | PRN

## 2022-11-11 MED ORDER — SODIUM CHLORIDE 0.9 % IV BOLUS
1000.0000 mL | Freq: Once | INTRAVENOUS | Status: AC
  Administered 2022-11-11: 1000 mL via INTRAVENOUS

## 2022-11-11 MED ORDER — HYDRALAZINE HCL 20 MG/ML IJ SOLN: 5.0000 mg | INTRAMUSCULAR | Status: DC | PRN

## 2022-11-11 MED ORDER — SODIUM CHLORIDE 0.9 % IV SOLN
500.0000 mg | Freq: Once | INTRAVENOUS | Status: AC
  Administered 2022-11-11: 500 mg via INTRAVENOUS
  Filled 2022-11-11: qty 5

## 2022-11-11 MED ORDER — ONDANSETRON HCL 4 MG/2ML IJ SOLN: 4.0000 mg | Freq: Four times a day (QID) | INTRAMUSCULAR | Status: DC | PRN

## 2022-11-11 MED ORDER — BISACODYL 5 MG PO TBEC: 5.0000 mg | DELAYED_RELEASE_TABLET | Freq: Every day | ORAL | Status: DC | PRN

## 2022-11-11 MED ORDER — PANTOPRAZOLE SODIUM 40 MG IV SOLR
40.0000 mg | Freq: Two times a day (BID) | INTRAVENOUS | Status: DC
  Administered 2022-11-11 – 2022-11-12 (×2): 40 mg via INTRAVENOUS
  Filled 2022-11-11 (×2): qty 10

## 2022-11-11 MED ORDER — LACTATED RINGERS IV BOLUS
1000.0000 mL | Freq: Once | INTRAVENOUS | Status: AC
  Administered 2022-11-11: 1000 mL via INTRAVENOUS

## 2022-11-11 MED ORDER — SODIUM CHLORIDE 0.9 % IV SOLN
500.0000 mg | INTRAVENOUS | Status: DC
  Administered 2022-11-12: 500 mg via INTRAVENOUS
  Filled 2022-11-11: qty 500

## 2022-11-11 MED ORDER — ASPIRIN 81 MG PO TBEC
81.0000 mg | DELAYED_RELEASE_TABLET | Freq: Every day | ORAL | Status: DC
  Administered 2022-11-12 – 2022-11-13 (×2): 81 mg via ORAL
  Filled 2022-11-11 (×2): qty 1

## 2022-11-11 MED ORDER — MORPHINE SULFATE (PF) 2 MG/ML IV SOLN: 2.0000 mg | INTRAVENOUS | Status: DC | PRN

## 2022-11-11 MED ORDER — ACETAMINOPHEN 650 MG RE SUPP: 650.0000 mg | Freq: Four times a day (QID) | RECTAL | Status: DC | PRN

## 2022-11-11 MED ORDER — SODIUM CHLORIDE 0.9 % IV SOLN
1.0000 g | INTRAVENOUS | Status: DC
  Administered 2022-11-12: 1 g via INTRAVENOUS
  Filled 2022-11-11: qty 1

## 2022-11-11 MED ORDER — HEPARIN SODIUM (PORCINE) 5000 UNIT/ML IJ SOLN
5000.0000 [IU] | Freq: Three times a day (TID) | INTRAMUSCULAR | Status: DC
  Administered 2022-11-11 – 2022-11-13 (×5): 5000 [IU] via SUBCUTANEOUS
  Filled 2022-11-11 (×4): qty 1

## 2022-11-11 MED ORDER — GUAIFENESIN ER 600 MG PO TB12: 600.0000 mg | ORAL_TABLET | Freq: Two times a day (BID) | ORAL | Status: DC | PRN

## 2022-11-11 MED ORDER — DOCUSATE SODIUM 100 MG PO CAPS
100.0000 mg | ORAL_CAPSULE | Freq: Two times a day (BID) | ORAL | Status: DC
  Administered 2022-11-12 – 2022-11-13 (×3): 100 mg via ORAL
  Filled 2022-11-11 (×3): qty 1

## 2022-11-11 MED ORDER — ALBUTEROL SULFATE (2.5 MG/3ML) 0.083% IN NEBU: 2.5000 mg | INHALATION_SOLUTION | RESPIRATORY_TRACT | Status: DC | PRN

## 2022-11-11 MED ORDER — ONDANSETRON HCL 4 MG PO TABS: 4.0000 mg | ORAL_TABLET | Freq: Four times a day (QID) | ORAL | Status: DC | PRN

## 2022-11-11 MED ORDER — SODIUM CHLORIDE 0.9 % IV SOLN
1.0000 g | Freq: Once | INTRAVENOUS | Status: AC
  Administered 2022-11-11: 1 g via INTRAVENOUS
  Filled 2022-11-11: qty 10

## 2022-11-11 NOTE — ED Provider Notes (Signed)
[facility]    CSN: [id number] Arrival date & time: 11/11/22  [id number]      History   Chief Complaint Chief Complaint  Patient presents with   Shortness of Breath   Emesis   Diarrhea    HPI Colvin Rayhill is a 72 y.o. male.   HPI  72 year old male here for evaluation of multisystem complaints.  The patient has a significant past medical history for type 2 diabetes with CKD stage III secondary to the diabetes, hypertension, hyperlipidemia, BPH, and GERD presenting for evaluation of 2 days worth of vomiting, diarrhea, and shortness of breath.  The patient reports that he had upper respiratory symptoms consisting of runny nose, nasal congestion, and ear pain last week but those have largely resolved.  No associated fever or sore throat.  He reports that he has not had any vomiting since late Monday night but he continues to have intermittent nausea.  His diarrhea stools have continued and he describes them as being watery and having 8-10 stools a day.  He has had a decreased appetite and has not taken any food but he is taking in fluids.  He has had some dizziness but no fainting.  He does have a history of low magnesium in the past.  Past Medical History:  Diagnosis Date   Acid reflux 04/12/2012   Allergy    Arteriosclerosis of coronary artery 04/12/2012   Overview:  1.  Atherosclerotic coronary artery disease.    a.  normal LV function.  Coronary arteriography 5/98 revealed 50% LAD lesion, 95% lesion in large diagonal branch. Left circumflex was normal.  25% proximal RCA lesion.  50% lesion in the PDA.    b.  PTCA of the first diagonal and placement of three multi-link stents.    c.  recent ETT Cardiolite revealed low probability for ischemia.     d.  cardiac catheterization on 05/18/97 per [name] revealed 80% LAD, 70% proximal, 90% mid first AL, 30% RCA.     e.  status post coronary bypass grafting x 2 per [name].    f.  recurrent chest pain with exertion,  equivocal ETT.    g.  repeat cardiac catheterization revealed an occluded vein graft.     h.  medical therapy.    i.  ETT Myoview showed borderline changes.     j.  cardiac catheterization 8/02 revealed no significant change in anatomy from previous cath.     k. cardiac cath 06/19/05 revealed patent left internal mammary artery to occluded LAD. Saphenous vein to D1 was chronically occ   Benign essential HTN 11/19/2015   Benign fibroma of prostate 03/05/2014   Cannot sleep 03/05/2014   Colon polyp 04/12/2012   COVID 08/2020   HLD (hyperlipidemia) 11/19/2015   Presence of artificial eye 04/12/2012   Spinal stenosis 04/12/2012   Type 2 diabetes mellitus ([place]) 03/05/2014    Patient Active Problem List   Diagnosis Date Noted   Sinus congestion 10/26/2022   Type 2 diabetes mellitus with proteinuria ([place]) 10/10/2021   Hypomagnesemia 10/05/2021   Hypertriglyceridemia 12/11/2020   Irritable bowel syndrome 12/10/2020   Vitamin B12 deficiency 02/29/2020   CKD stage 3 due to type 2 diabetes mellitus ([place]) 02/18/2020   Erectile dysfunction 01/05/2020   History of hip replacement 07/25/2019   Hyperlipidemia associated with type 2 diabetes mellitus ([place]) 05/24/2018   Hypertension associated with type 2 diabetes mellitus ([place]) 05/24/2018   Type 2 diabetes mellitus with hyperglycemia, with long-term  current use of insulin ([place]) 05/24/2018   Benign fibroma of prostate 03/05/2014   BPH (benign prostatic hyperplasia) 03/05/2014   Insomnia 03/05/2014   Arteriosclerosis of coronary artery 04/12/2012   Spinal stenosis 04/12/2012   GERD (gastroesophageal reflux disease) 04/12/2012   Eye globe prosthesis 04/12/2012    Past Surgical History:  Procedure Laterality Date   COLONOSCOPY WITH PROPOFOL N/A 12/13/2017   Procedure: COLONOSCOPY WITH PROPOFOL;  Surgeon: [name], MD;  Location: [facility] ENDOSCOPY;  Service: Endoscopy;  Laterality: N/A;   CORONARY ARTERY BYPASS GRAFT  1998   CORONARY STENT  PLACEMENT  2006   JOINT REPLACEMENT Left    hip- 06/2019   left eye     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 Authorizing Provider  amitriptyline (ELAVIL) 10 MG tablet Take 4 tablets (40 mg total) by mouth at bedtime. 07/31/22  Yes Cannady, Henrine Screws T, NP  aspirin EC 81 MG tablet Take 81 mg by mouth daily.    Yes [provider]  atenolol (TENORMIN) 50 MG tablet Take 1 tablet (50 mg total) by mouth daily. 05/21/22  Yes Cannady, Henrine Screws T, NP  clopidogrel (PLAVIX) 75 MG tablet Take 1 tablet (75 mg total) by mouth daily. 05/21/22  Yes Cannady, Jolene T, NP  fenofibrate (TRICOR) 145 MG tablet Take 1 tablet (145 mg total) by mouth daily. 05/21/22  Yes Cannady, Jolene T, NP  glipiZIDE (GLUCOTROL XL) 2.5 MG 24 hr tablet Take 2.5 mg by mouth daily. 04/23/22  Yes [provider]  insulin degludec (TRESIBA FLEXTOUCH) 100 UNIT/ML FlexTouch Pen Inject 70 Units into the skin daily.    Yes [provider]  metFORMIN (GLUCOPHAGE) 500 MG tablet TAKE 2 TABLETS TWICE DAILY WITH MEALS 05/21/22  Yes Cannady, Jolene T, NP  nitroGLYCERIN (NITROSTAT) 0.4 MG SL tablet Place 0.4 mg under the tongue every 5 (five) minutes as needed.   Yes [provider]  omega-3 acid ethyl esters (LOVAZA) 1 g capsule Take 2 capsules (2 g total) by mouth 2 (two) times daily. 05/21/22  Yes Cannady, Jolene T, NP  pantoprazole (PROTONIX) 20 MG tablet TAKE 1 TABLET EVERY DAY (DISCONTINUE '40MG'$  DOSE) 07/31/22  Yes Cannady, Jolene T, NP  ramipril (ALTACE) 1.25 MG capsule Take 1 capsule (1.25 mg total) by mouth daily. 05/21/22  Yes Cannady,  Jolene T, NP  rosuvastatin (CRESTOR) 40 MG tablet Take 1 tablet (40 mg total) by mouth daily. 07/31/22  Yes Cannady, Jolene T, NP  vitamin B-12 (CYANOCOBALAMIN) 1000 MCG tablet Take 1,000 mcg by mouth daily.   Yes [provider]  ACCU-CHEK GUIDE test strip 1 each by Other route 2 times daily at 12 noon and 4 pm. 07/30/21   [provider]  benzonatate (TESSALON PERLES) 100 MG capsule Take 1 capsule (100 mg total) by mouth 3 (three) times daily as needed. 10/26/22   Cannady, Henrine Screws T, NP  neomycin-polymyxin-dexamethasone (MAXITROL) 0.1 % ophthalmic suspension Place 1 drop into the left eye 4 (four) times daily. 10/10/21   [name], NP  Family History Family History  Problem Relation Age of Onset   Hypertension Brother    Diabetes Brother    Heart disease Mother    Heart disease Father    Diabetes Daughter    Schizophrenia Maternal Grandmother    Heart disease Brother    Prostate cancer Neg Hx    Bladder Cancer Neg Hx    Kidney cancer Neg Hx     Social History Social History   Tobacco Use   Smoking status: Former    Types: Cigarettes    Quit date: 1998    Years since quitting: 26.1   Smokeless tobacco: Never  Vaping Use   Vaping Use: Never used  Substance Use Topics   Alcohol use: Yes    Alcohol/week: 6.0 standard drinks of alcohol    Types: 6 Cans of beer per week    Comment: occasional   Drug use: No     Allergies   Trulicity [dulaglutide] and Penicillins   Review of Systems Review of Systems  Constitutional:  Negative for fever.  Respiratory:  Positive for cough and shortness of breath.   Gastrointestinal:  Positive for diarrhea, nausea and vomiting. Negative for abdominal pain and blood in stool.  Neurological:  Positive for dizziness. Negative for syncope.     Physical Exam Triage Vital Signs ED Triage Vitals  Enc Vitals Group     BP      Pulse      Resp      Temp      Temp src      SpO2      Weight      Height       Head Circumference      Peak Flow      Pain Score      Pain Loc      Pain Edu?      Excl. in [place]?    No data found.  Updated Vital Signs BP 105/69 (BP Location: Left Arm)   Pulse 72   Temp 98.1 F (36.7 C)   Resp 18   SpO2 100%   Visual Acuity Right Eye Distance:   Left Eye Distance:   Bilateral Distance:    Right Eye Near:   Left Eye Near:    Bilateral Near:     Physical Exam Vitals and nursing note reviewed.  Constitutional:      Appearance: Normal appearance. He is not ill-appearing.  HENT:     Head: Normocephalic and atraumatic.     Right Ear: Tympanic membrane, ear canal and external ear normal. There is no impacted cerumen.     Left Ear: Tympanic membrane, ear canal and external ear normal. There is no impacted cerumen.     Nose: Nose normal. No congestion or rhinorrhea.     Mouth/Throat:     Mouth: Mucous membranes are moist.     Pharynx: Oropharynx is clear. No oropharyngeal exudate or posterior oropharyngeal erythema.  Cardiovascular:     Rate and Rhythm: Normal rate and regular rhythm.     Pulses: Normal pulses.     Heart sounds: Normal heart sounds. No murmur heard.    No friction rub. No gallop.  Pulmonary:     Effort: Pulmonary effort is normal.     Breath sounds: Normal breath sounds. No wheezing, rhonchi or rales.  Abdominal:     General: Abdomen is flat.     Palpations: Abdomen is soft.     Tenderness: There is abdominal tenderness. There  is no guarding or rebound.     Comments: Generalized abdominal tenderness without focal findings.  No guarding or rebound.  Musculoskeletal:     Cervical back: Normal range of motion and neck supple.  Lymphadenopathy:     Cervical: No cervical adenopathy.  Skin:    General: Skin is warm and dry.     Capillary Refill: Capillary refill takes less than 2 seconds.     Comments: Normal skin turgor.  Neurological:     General: No focal deficit present.     Mental Status: He is alert and oriented to person, place,  and time.      UC Treatments / Results  Labs (all labs ordered are listed, but only abnormal results are displayed) Labs Reviewed  COMPREHENSIVE METABOLIC PANEL - Abnormal; Notable for the following components:      Result Value   Sodium 132 (*)    CO2 19 (*)    Glucose, Bld 156 (*)    BUN 49 (*)    Creatinine, Ser 3.25 (*)    Calcium 8.2 (*)    GFR, Estimated 19 (*)    All other components within normal limits  CBC WITH DIFFERENTIAL/PLATELET    EKG   Radiology DG Chest 2 View  Result Date: 11/11/2022 CLINICAL DATA:  Cough EXAM: CHEST - 2 VIEW COMPARISON:  CXR 03/26/20 FINDINGS: Status post median sternotomy and CABG. No pleural effusion. No pneumothorax. Compared to prior exam there is a hazy opacity along paratracheal stripe on the right. Normal cardiac and mediastinal contours. Vertebral body heights are maintained. Surgical clips in the right upper quadrant. There may also be an adjacent rib fracture. IMPRESSION: Hazy opacity along the paratracheal stripe on the right, new since prior exam. This could be due to overlapping soft tissues, but an underlying airspace of mediastinal mass is not excluded. Recommend further evaluation with a chest CT. There may also be an adjacent rib fracture. Electronically Signed   By: Marin Roberts M.D.   On: 11/11/2022 10:01    Procedures Procedures (including critical care time)  Medications Ordered in UC Medications  sodium chloride 0.9 % bolus 1,000 mL (1,000 mLs Intravenous New Bag/Given 11/11/22 1000)    Initial Impression / Assessment and Plan / UC Course  I have reviewed the triage vital signs and the nursing notes.  Pertinent labs & imaging results that were available during my care of the patient were reviewed by me and considered in my medical decision making (see chart for details).   Patient is a pleasant, nontoxic-appearing 72 year old male here for evaluation of vomiting, diarrhea, shortness of breath for the past 2 days as  outlined in HPI above.  Review of vital signs finds an afebrile at 98 1.  Remainder of his vital signs are normal.  He is satting 100% on room air.  He is not having any respiratory distress and he can speak in full sentence without dyspnea or tachypnea.  He states that his cough was initially productive for a significant amount of sputum but that has become more dry.  He is complaining of pain in his right ribs from all the coughing that he has been doing.  When his symptoms began 2 weeks ago he saw his PCP and was tested for COVID but was negative.  He was given Ladona Ridgel for the cough but has not helped.  On exam patient's sclera are bright and shiny but his oral mucous membranes are sticky.  I do not find any  inflammation of his upper respiratory tract and his lungs are clear to auscultation.  However, given his 2 weeks of cough I will obtain a chest x-ray for any acute cardiopulmonary process.  He has a history of electrolyte imbalance so I will check a CMP as well as a CBC here in clinic.  His most recent CBC in epic is from 07/31/2022 and it showed a BUN of 20 and a creatinine of 1.45 at that time.  Because he is dizzy and feeling symptomatic I will give him a liter fluid bolus  CBC is unremarkable with a normal white count of 6.9, H&H of 16.6 and 48.8, and platelet count of 365.  He shows hyponatremia with a sodium of 132 as well as acute kidney injury with a BUN of 49 and a creatinine of 3.25.  GFR is 19.  Glucose is mildly elevated at 156.  Radiology impression of chest x-ray states that there is a hazy opacity along the paratracheal stripe on the right that is new since prior exam.  Possibly overlapping soft tissue but underlying airspace mediastinal mass is not excluded.  Recommend evaluation with chest CT.  Possible adjacent rib fracture.  Given patient's acute kidney injury coupled with the possible mediastinal mass I have recommended that he seek care in the emergency department.  He  wants to travel by POV and I feel he is stable to do so.  I have called and spoken to [facility], the charge nurse in the ER at [facility] to let her know he is in route.   Final Clinical Impressions(s) / UC Diagnoses   Final diagnoses:  Acute kidney injury ([place])  Hyponatremia  Mediastinal mass     Discharge Instructions      Please go to the ER at [facility] to be evaluated for your acute kidney injury and also the questionable mediastinal mass seen on your chest x-ray.  Please go now.     ED Prescriptions   None    PDMP not reviewed this encounter.   Margarette Canada, NP 11/11/22 1029

## 2022-11-11 NOTE — Discharge Instructions (Signed)
Please go to the ER at [facility] to be evaluated for your acute kidney injury and also the questionable mediastinal mass seen on your chest x-ray.  Please go now.

## 2022-11-11 NOTE — ED Triage Notes (Signed)
Pt to ED from UC, began having vomiting and diarrhea 2 days ago and also feeling SOB with exertion. Vomiting stopped same day. Still having diarrhea, has been taking immodium. States 8-10 episodes/day.  Went to UC today, had CXR and labs, was told to come to ER because they saw a mass in chest on cxr and also that kidney labs are abnml. Pt walked to triage room, not SOB appearing.

## 2022-11-11 NOTE — ED Notes (Signed)
Pt denies needs at this time, family at bedside. Pt and family informed of bed placement on floor rm 150.

## 2022-11-11 NOTE — Assessment & Plan Note (Signed)
Chest x-ray consistent with infiltrates in the right middle lobe. Suspect patient may have aspirated or has atypical viral pneumonia. Will continue with IV antibiotic regimen and pulse oximetry with vitals. IV PPI therapy.

## 2022-11-11 NOTE — Assessment & Plan Note (Signed)
Resume therapy once creatinine is back to normal and lft are resulted.

## 2022-11-11 NOTE — ED Notes (Signed)
Patient is being discharged from the Urgent Care and sent to the Emergency Department via Personal Vehicle . Per Anson Crofts, NP, patient is in need of higher level of care due to Acute Kidney Injury. Patient is aware and verbalizes understanding of plan of care.  Vitals:   11/11/22 0856  BP: 105/69  Pulse: 72  Resp: 18  Temp: 98.1 F (36.7 C)  SpO2: 100%

## 2022-11-11 NOTE — H&P (Signed)
History and Physical    Chief Complaint: Cough   HISTORY OF PRESENT ILLNESS: [name] is an 72 y.o. male seen in the emergency room today with complaints of cough and shortness of breath. Patient was seen on February 12 by primary care physician for similar complaints and thought to have COVID and was started on Paxlovid as he had exposure to [place] patient's COVID testing was negative and Paxlovid was discontinued. Patient has had fever cough shortness of breath chest congestion runny nose nasal congestion sore throat.  Since beginning of his illness patient has not developed shortness of breath and nausea, vomiting, diarrhea for about a week now.  Shortness of breath has been progressively getting worse over the past 24 to 48 hours since it started. Patient went back to the urgent care where he had a chest x-ray showing concerning findings for possible pneumonia.  And was referred to the emergency room. The patient requested for AKI and pneumonia.  Pt has  Past Medical History:  Diagnosis Date   Acid reflux 04/12/2012   Allergy    Arteriosclerosis of coronary artery 04/12/2012   Overview:  1.  Atherosclerotic coronary artery disease.    a.  normal LV function.  Coronary arteriography 5/98 revealed 50% LAD lesion, 95% lesion in large diagonal branch. Left circumflex was normal.  25% proximal RCA lesion.  50% lesion in the PDA.    b.  PTCA of the first diagonal and placement of three multi-link stents.    c.  recent ETT Cardiolite revealed low probability for ischemia.     d.  cardiac catheterization on 05/18/97 per [name] revealed 80% LAD, 70% proximal, 90% mid first AL, 30% RCA.     e.  status post coronary bypass grafting x 2 per [name].    f.  recurrent chest pain with exertion, equivocal ETT.    g.  repeat cardiac catheterization revealed an occluded vein graft.     h.  medical therapy.    i.  ETT Myoview showed borderline changes.     j.  cardiac catheterization 8/02  revealed no significant change in anatomy from previous cath.     k. cardiac cath 06/19/05 revealed patent left internal mammary artery to occluded LAD. Saphenous vein to D1 was chronically occ   Benign essential HTN 11/19/2015   Benign fibroma of prostate 03/05/2014   Cannot sleep 03/05/2014   Colon polyp 04/12/2012   COVID 08/2020   HLD (hyperlipidemia) 11/19/2015   Presence of artificial eye 04/12/2012   Spinal stenosis 04/12/2012   Type 2 diabetes mellitus ([place]) 03/05/2014     Review of Systems  Constitutional:  Positive for chills, fatigue and fever.  HENT:  Positive for congestion, postnasal drip, rhinorrhea and sore throat.   Respiratory:  Positive for cough and shortness of breath.   Gastrointestinal:  Positive for diarrhea, nausea and vomiting.   Allergies  Allergen Reactions   Trulicity [Dulaglutide] Nausea Only   Penicillins Rash    Childhood. Did not require medical care. Childhood. Did not require medical care. Childhood. Did not require medical care.   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)   LEFT HEART CATH AND CORONARY ANGIOGRAPHY Left 06/28/2018   Procedure: LEFT HEART CATH AND CORONARY ANGIOGRAPHY;  Surgeon: [name], MD;  Location: [facility];  Service: Cardiovascular;  Laterality: Left;   lt knee cap     had to have lt knee cap replaced       MEDICATIONS: Current Outpatient Medications  Medication Instructions   ACCU-CHEK GUIDE test strip 1 each, Other, 2 times daily   amitriptyline (ELAVIL) 40 mg, Oral, Daily at bedtime   aspirin EC 81 mg, Oral, Daily   atenolol (TENORMIN) 50  mg, Oral, Daily   benzonatate (TESSALON PERLES) 100 mg, Oral, 3 times daily PRN   clopidogrel (PLAVIX) 75 mg, Oral, Daily   cyanocobalamin (VITAMIN B12) 1,000 mcg, Oral, Daily   fenofibrate (TRICOR) 145 mg, Oral, Daily   glipiZIDE (GLUCOTROL XL) 2.5 mg, Oral, Daily   metFORMIN (GLUCOPHAGE) 500 MG tablet TAKE 2 TABLETS TWICE DAILY WITH MEALS   neomycin-polymyxin-dexamethasone (MAXITROL) 0.1 % ophthalmic suspension 1 drop, Left Eye, 4 times daily   nitroGLYCERIN (NITROSTAT) 0.4 mg, Sublingual, Every 5 min PRN   omega-3 acid ethyl esters (LOVAZA) 2 g, Oral, 2 times daily   pantoprazole (PROTONIX) 20 MG tablet TAKE 1 TABLET EVERY DAY (DISCONTINUE '40MG'$  DOSE)   ramipril (ALTACE) 1.25 mg, Oral, Daily   rosuvastatin (CRESTOR) 40 mg, Oral, Daily   Tresiba FlexTouch 70 Units, Subcutaneous, Daily    ED Course: Pt in Ed alert afebrile oriented. Vitals:   11/11/22 1540 11/11/22 1730 11/11/22 1800 11/11/22 1830  BP: (!) 126/55 130/62 (!) 114/49 136/68  Pulse: 77 63 71 74  Resp: '15 19 17 '$ (!) 21  Temp:      TempSrc:      SpO2: 98% 100% 100% 100%  Weight:      Height:       No intake/output data recorded. SpO2: 100 % Blood work in ed shows: Hyponatremia of [id number] metabolic acidosis with a bicarb of 19 normal anion gap, creatinine of 2.80 which is acute kidney injury. LFTs within normal limits. CBC shows normal white count of 5.8 hemoglobin of 15.2 and platelet count of 330. Procalcitonin is 0.84. Respiratory panel is negative for influenza respiratory syncytial virus and COVID. Chest CT done today shows small area of ill-defined groundglass interstitial thickening along the right lower lobe medially with no consolidation pneumothorax or effusion.  Results for orders placed or performed during the hospital encounter of 11/11/22 (from the past 24 hour(s))  Basic metabolic panel     Status: Abnormal   Collection Time: 11/11/22 11:42 AM  Result Value Ref Range   Sodium 134 (L) 135 - 145 mmol/L    Potassium 4.1 3.5 - 5.1 mmol/L   Chloride 106 98 - 111 mmol/L   CO2 19 (L) 22 - 32 mmol/L   Glucose, Bld 160 (H) 70 - 99 mg/dL   BUN 49 (H) 8 - 23 mg/dL   Creatinine, Ser 2.80 (H) 0.61 - 1.24 mg/dL   Calcium 8.1 (L) 8.9 - 10.3 mg/dL   GFR, Estimated 23 (L) >60 mL/min   Anion gap 9 5 - 15  CBC     Status: None   Collection Time: 11/11/22 11:42 AM  Result Value Ref Range   WBC 5.8 4.0 - 10.5 K/uL   RBC 5.21 4.22 - 5.81 MIL/uL   Hemoglobin 15.2 13.0 - 17.0 g/dL   HCT 47.5 39.0 - 52.0 %   MCV  91.2 80.0 - 100.0 fL   MCH 29.2 26.0 - 34.0 pg   MCHC 32.0 30.0 - 36.0 g/dL   RDW 13.1 11.5 - 15.5 %   Platelets 330 150 - 400 K/uL   nRBC 0.0 0.0 - 0.2 %  Troponin I (High Sensitivity)     Status: None   Collection Time: 11/11/22 11:42 AM  Result Value Ref Range   Troponin I (High Sensitivity) 8 <18 ng/L  Troponin I (High Sensitivity)     Status: None   Collection Time: 11/11/22  1:29 PM  Result Value Ref Range   Troponin I (High Sensitivity) 6 <18 ng/L  Hepatic function panel     Status: None   Collection Time: 11/11/22  1:29 PM  Result Value Ref Range   Total Protein 6.8 6.5 - 8.1 g/dL   Albumin 3.5 3.5 - 5.0 g/dL   AST 23 15 - 41 U/L   ALT 27 0 - 44 U/L   Alkaline Phosphatase 48 38 - 126 U/L   Total Bilirubin 0.3 0.3 - 1.2 mg/dL   Bilirubin, Direct <0.1 0.0 - 0.2 mg/dL   Indirect Bilirubin NOT CALCULATED 0.3 - 0.9 mg/dL  Lipase, blood     Status: None   Collection Time: 11/11/22  1:29 PM  Result Value Ref Range   Lipase 39 11 - 51 U/L  Procalcitonin     Status: None   Collection Time: 11/11/22  1:29 PM  Result Value Ref Range   Procalcitonin 0.84 ng/mL  Resp panel by RT-PCR (RSV, Flu A&B, Covid) Anterior Nasal Swab     Status: None   Collection Time: 11/11/22  4:44 PM   Specimen: Anterior Nasal Swab  Result Value Ref Range   SARS Coronavirus 2 by RT PCR NEGATIVE NEGATIVE   Influenza A by PCR NEGATIVE NEGATIVE   Influenza B by PCR NEGATIVE NEGATIVE   Resp Syncytial Virus by  PCR NEGATIVE NEGATIVE    Unresulted Labs (From admission, onward)    None      Pt has received : Orders Placed This Encounter  Procedures   Resp panel by RT-PCR (RSV, Flu A&B, Covid) Anterior Nasal Swab    Standing Status:   Standing    Number of Occurrences:   1   US Abdomen Limited RUQ (LIVER/GB)    Standing Status:   Standing    Number of Occurrences:   1    Order Specific Question:   Symptom/Reason for Exam    Answer:   RUQ pain [id number]   CT CHEST WO CONTRAST    Standing Status:   Standing    Number of Occurrences:   1   Basic metabolic panel    Standing Status:   Standing    Number of Occurrences:   1   CBC    Standing Status:   Standing    Number of Occurrences:   1   Hepatic function panel    Standing Status:   Standing    Number of Occurrences:   1   Lipase, blood    Standing Status:   Standing    Number of Occurrences:   1   Procalcitonin    Standing Status:   Standing    Number of Occurrences:   1   Document Height and Actual Weight    Use scales to weigh patient, not stated or estimated weight.    Standing Status:   Standing    Number of Occurrences:   1  Consult to hospitalist    Standing Status:   Standing    Number of Occurrences:   1    Order Specific Question:   Place call to:    Answer:   [phone]    Order Specific Question:   Reason for Consult    Answer:   Admit   Airborne and Contact precautions    Standing Status:   Standing    Number of Occurrences:   1   ED EKG    Standing Status:   Standing    Number of Occurrences:   1    Order Specific Question:   Reason for Exam    Answer:   Shortness of breath    Meds ordered this encounter  Medications   lactated ringers bolus 1,000 mL   cefTRIAXone (ROCEPHIN) 1 g in sodium chloride 0.9 % 100 mL IVPB    Order Specific Question:   Antibiotic Indication:    Answer:   CAP   azithromycin (ZITHROMAX) 500 mg in sodium chloride 0.9 % 250 mL IVPB     Admission Imaging : US Abdomen Limited RUQ  (LIVER/GB)  Result Date: 11/11/2022 CLINICAL DATA:  Right upper quadrant pain for week. Previous cholecystectomy EXAM: ULTRASOUND ABDOMEN LIMITED RIGHT UPPER QUADRANT COMPARISON:  CT abdomen 09/20/2014 FINDINGS: Gallbladder: Gallbladder is surgically absent as per history Common bile duct: Diameter: 4 mm Liver: Diffusely echogenic hepatic parenchyma consistent with fatty liver infiltration. With this level of echogenicity evaluation for underlying mass lesion is limited and if needed follow-up contrast CT or MRI as clinically directed. Portal vein is patent on color Doppler imaging with normal direction of blood flow towards the liver. Other: None. IMPRESSION: Previous cholecystectomy.  No ductal dilatation. Fatty liver infiltration Electronically Signed   By: Jill Side M.D.   On: 11/11/2022 16:48   CT CHEST WO CONTRAST  Result Date: 11/11/2022 CLINICAL DATA:  Pneumonia EXAM: CT CHEST WITHOUT CONTRAST TECHNIQUE: Multidetector CT imaging of the chest was performed following the standard protocol without IV contrast. RADIATION DOSE REDUCTION: This exam was performed according to the departmental dose-optimization program which includes automated exposure control, adjustment of the mA and/or kV according to patient size and/or use of iterative reconstruction technique. COMPARISON:  Chest x-ray 11/03/2022 earlier and older exams. FINDINGS: Cardiovascular: Status post median sternotomy. On this non IV contrast exam there is no pericardial effusion. The heart is nonenlarged. The thoracic aorta grossly has a normal course and caliber on this noncontrast examination with scattered vascular calcifications. Particularly there are significant coronary artery calcifications. Known bypass grafts. Mediastinum/Nodes: No specific abnormal lymph node enlargement present in the axillary regions, hilum or mediastinum. Small thyroid gland. Normal caliber thoracic esophagus. Lungs/Pleura: Slight breathing motion. No  consolidation, pneumothorax or effusion. There is minimal interstitial thickening focally with some ground-glass along the superior segment right lower lobe medially. There also some small punctate calcified nodules identified such as right upper lobe on series 4, image 19 consistent with old granulomatous disease. Additional focus on image 78 on series 4 in the right lung anteriorly. Upper Abdomen: Minimal thickening of the right adrenal gland. Left adrenal gland is preserved. Musculoskeletal: Mild degenerative changes along the spine. IMPRESSION: Small area of ill-defined ground-glass interstitial thickening along the superior segment of the right lower lobe medially. No consolidation, pneumothorax or effusion. Recommend follow up in 3 months. Evidence of old granulomatous disease. Aortic Atherosclerosis (ICD10-I70.0). Electronically Signed   By: Jill Side M.D.   On: 11/11/2022 16:35   DG Chest  2 View  Result Date: 11/11/2022 CLINICAL DATA:  Cough EXAM: CHEST - 2 VIEW COMPARISON:  CXR 03/26/20 FINDINGS: Status post median sternotomy and CABG. No pleural effusion. No pneumothorax. Compared to prior exam there is a hazy opacity along paratracheal stripe on the right. Normal cardiac and mediastinal contours. Vertebral body heights are maintained. Surgical clips in the right upper quadrant. There may also be an adjacent rib fracture. IMPRESSION: Hazy opacity along the paratracheal stripe on the right, new since prior exam. This could be due to overlapping soft tissues, but an underlying airspace of mediastinal mass is not excluded. Recommend further evaluation with a chest CT. There may also be an adjacent rib fracture. Electronically Signed   By: Marin Roberts M.D.   On: 11/11/2022 10:01    Physical Examination: Vitals:   11/11/22 1540 11/11/22 1730 11/11/22 1800 11/11/22 1830  BP: (!) 126/55 130/62 (!) 114/49 136/68  Pulse: 77 63 71 74  Temp:      Resp: '15 19 17 '$ (!) 21  Height:      Weight:       SpO2: 98% 100% 100% 100%  TempSrc:      BMI (Calculated):       Physical Exam Vitals and nursing note reviewed.  Constitutional:      General: He is not in acute distress.    Appearance: He is not ill-appearing, toxic-appearing or diaphoretic.  HENT:     Head: Normocephalic and atraumatic.     Right Ear: Hearing and external ear normal.     Left Ear: Hearing and external ear normal.     Nose: Nose normal. No nasal deformity.     Mouth/Throat:     Lips: Pink.     Mouth: Mucous membranes are moist.     Tongue: No lesions.     Pharynx: Oropharynx is clear.  Eyes:     Extraocular Movements: Extraocular movements intact.  Neck:     Vascular: No carotid bruit.  Cardiovascular:     Rate and Rhythm: Normal rate and regular rhythm.     Pulses: Normal pulses.     Heart sounds: Normal heart sounds.  Pulmonary:     Effort: Pulmonary effort is normal.     Breath sounds: Normal breath sounds.  Abdominal:     General: Bowel sounds are normal. There is no distension.     Palpations: Abdomen is soft. There is no mass.     Tenderness: There is no abdominal tenderness. There is no guarding.     Hernia: No hernia is present.  Musculoskeletal:     Right lower leg: No edema.     Left lower leg: No edema.  Skin:    General: Skin is warm.  Neurological:     General: No focal deficit present.     Mental Status: He is alert and oriented to person, place, and time.     Cranial Nerves: Cranial nerves 2-12 are intact.     Motor: Motor function is intact.  Psychiatric:        Attention and Perception: Attention normal.        Mood and Affect: Mood normal.        Speech: Speech normal.        Behavior: Behavior normal. Behavior is cooperative.        Cognition and Memory: Cognition normal.       Assessment and Plan: * CAP (community acquired pneumonia) Chest x-ray consistent with infiltrates in the right middle lobe.  Suspect patient may have aspirated or has atypical viral  pneumonia. Will continue with IV antibiotic regimen and pulse oximetry with vitals. IV PPI therapy.   Nausea vomiting and diarrhea Supportive care with antiemetics, IV fluids, antitussives, antidiarrheals as needed. Maintenance IV fluid. Replacement of electrolytes. IV PPI therapy. Stool for GI PCR panel.  Electrolyte abnormality Replace and follow level.     Latest Ref Rng & Units 11/11/2022    1:29 PM 11/11/2022   11:42 AM 11/11/2022    9:51 AM  CMP  Glucose 70 - 99 mg/dL  160  156   BUN 8 - 23 mg/dL  49  49   Creatinine 0.61 - 1.24 mg/dL  2.80  3.25   Sodium 135 - 145 mmol/L  134  132   Potassium 3.5 - 5.1 mmol/L  4.1  3.8   Chloride 98 - 111 mmol/L  106  101   CO2 22 - 32 mmol/L  19  19   Calcium 8.9 - 10.3 mg/dL  8.1  8.2   Total Protein 6.5 - 8.1 g/dL 6.8   7.9   Total Bilirubin 0.3 - 1.2 mg/dL 0.3   0.4   Alkaline Phos 38 - 126 U/L 48   54   AST 15 - 41 U/L 23   26   ALT 0 - 44 U/L 27   31      Type 2 diabetes mellitus with proteinuria (H[facility]) Home regimen includes  long acting insulin ,metformin, glipizide. Currently we will start patient on glycemic protocol. With Accu-Cheks every 4 hourly until he is able to tolerate p.o.   CKD stage 3 due to type 2 diabetes mellitus ([place]) Patient has CKD stage IIIa and chart review and problem list.  Abnormal kidney function is worse patient has had return to normal kidney function with normal creatinine intermittently in the past on chart review.   Renally dose  and avoid contrast.  ACE inhibitors, hold metformin.  BPH (benign prostatic hyperplasia) Current creatinine is 2.8. No symptoms and resume meds once med reconciliation is available.  GERD (gastroesophageal reflux disease) IV PPI.  Hypertension associated with type 2 diabetes mellitus ([place]) Vitals:   11/11/22 1130 11/11/22 1540 11/11/22 1730 11/11/22 1800  BP: 121/69 (!) 126/55 130/62 (!) 114/49   11/11/22 1830  BP: 136/68  Currently we will hold patient's  atenolol, ramipril.  Monitor blood pressure overnight and restart same dose or lower dose as deemed appropriate.   Hyperlipidemia associated with type 2 diabetes mellitus ([place]) Resume therapy once creatinine is back to normal and lft are resulted.    DVT prophylaxis:  SCDs Code Status:  Full code Family Communication:  Bethena Roys priorWU:[id number]. Disposition Plan:  Home Consults called:  None Admission status: Inpatient Unit/ Expected LOS: Med/tele/1 to 2 days.   Para Skeans MD [facility]  6 PM- 2 AM. Please contact me via secure Chat 6 PM-2 AM. [phone]( Pager ) To contact the [facility] Attending or Consulting provider [place] or covering provider during after hours [place], for this patient.   Check the care team in [facility] and look for a) attending/consulting [facility] provider listed and b) the [facility] team listed Log into [email or web address] and use [place] universal password to access. If you do not have the password, please contact the hospital operator. Locate the [facility] provider you are looking for under [facility] and page to a number that you can be directly reached. If you still have difficulty reaching the  provider, please page the [facility] (Director on Call) for the Hospitalists listed on amion for assistance. [email or web address] 11/11/2022, 7:07 PM

## 2022-11-11 NOTE — ED Provider Notes (Signed)
[facility] Provider Note    Event Date/Time   First MD Initiated Contact with Patient 11/11/22 1536     (approximate)   History   Chief Complaint SOB with exertion and Diarrhea   HPI  [name] is a 72 y.o. male with past medical history of hypertension, hyperlipidemia, diabetes, CAD status post CABG, and CKD who presents to the ED complaining of shortness of breath and diarrhea.  Patient reports that he has been dealing with nausea, vomiting, and diarrhea for about the past week.  Vomiting was initially more severe, has since improved but he continues to have frequent diarrhea and nausea with decreased oral intake.  He endorses a cough productive of thick whitish sputum, has begun to feel short of breath over the past 24 to 48 hours.  Difficulty breathing is primarily with exertion, he endorses occasional tightness in his chest but no chest discomfort currently.  He is not aware of any fevers, was exposed to multiple family members with similar symptoms earlier in the week.  He was initially evaluated at urgent care, referred to the ED for further evaluation due to abnormal chest x-ray and AKI.     Physical Exam   Triage Vital Signs: ED Triage Vitals  Enc Vitals Group     BP 11/11/22 1130 121/69     Pulse Rate 11/11/22 1130 66     Resp 11/11/22 1130 15     Temp 11/11/22 1130 (!) 97.5 F (36.4 C)     Temp Source 11/11/22 1130 Oral     SpO2 11/11/22 1130 100 %     Weight 11/11/22 1128 185 lb (83.9 kg)     Height 11/11/22 1128 6' (1.829 m)     Head Circumference --      Peak Flow --      Pain Score 11/11/22 1127 2     Pain Loc --      Pain Edu? --      Excl. in [place]? --     Most recent vital signs: Vitals:   11/11/22 1540 11/11/22 1730  BP: (!) 126/55 130/62  Pulse: 77 63  Resp: 15 19  Temp:    SpO2: 98% 100%    Constitutional: Alert and oriented. Eyes: Conjunctivae are normal. Head: Atraumatic. Nose: No  congestion/rhinnorhea. Mouth/Throat: Mucous membranes are dry. Cardiovascular: Normal rate, regular rhythm. Grossly normal heart sounds.  2+ radial pulses bilaterally. Respiratory: Normal respiratory effort.  No retractions. Lungs CTAB. Gastrointestinal: Soft and tender to palpation in the right upper quadrant with no rebound or guarding. No distention. Musculoskeletal: No lower extremity tenderness nor edema.  Neurologic:  Normal speech and language. No gross focal neurologic deficits are appreciated.    ED Results / Procedures / Treatments   Labs (all labs ordered are listed, but only abnormal results are displayed) Labs Reviewed  BASIC METABOLIC PANEL - Abnormal; Notable for the following components:      Result Value   Sodium 134 (*)    CO2 19 (*)    Glucose, Bld 160 (*)    BUN 49 (*)    Creatinine, Ser 2.80 (*)    Calcium 8.1 (*)    GFR, Estimated 23 (*)    All other components within normal limits  RESP PANEL BY RT-PCR (RSV, FLU A&B, COVID)  RVPGX2  CBC  HEPATIC FUNCTION PANEL  LIPASE, BLOOD  PROCALCITONIN  TROPONIN I (HIGH SENSITIVITY)  TROPONIN I (HIGH SENSITIVITY)     EKG  ED ECG REPORT I, Blake Divine, the attending physician, personally viewed and interpreted this ECG.   Date: 11/11/2022  EKG Time: 11:38  Rate: 65  Rhythm: normal sinus rhythm  Axis: Normal  Intervals:none  ST&T Change: None  RADIOLOGY CT chest reviewed and interpreted by me with opacity developing in the right lower lobe, no effusion or edema noted.  PROCEDURES:  Critical Care performed: No  Procedures   MEDICATIONS ORDERED IN ED: Medications  azithromycin (ZITHROMAX) 500 mg in sodium chloride 0.9 % 250 mL IVPB (500 mg Intravenous New Bag/Given 11/11/22 1740)  lactated ringers bolus 1,000 mL (0 mLs Intravenous Stopped 11/11/22 1822)  cefTRIAXone (ROCEPHIN) 1 g in sodium chloride 0.9 % 100 mL IVPB (0 g Intravenous Stopped 11/11/22 1822)     IMPRESSION / MDM / ASSESSMENT AND  PLAN / ED COURSE  I reviewed the triage vital signs and the nursing notes.                              72 y.o. male with past medical history of hypertension, hyperlipidemia, diabetes, CAD status post CABG, and CKD who presents to the ED with 1 week of nausea, vomiting, and diarrhea with productive cough, now with increasing difficulty breathing over the past 24 to 48 hours.  Patient's presentation is most consistent with acute presentation with potential threat to life or bodily function.  Differential diagnosis includes, but is not limited to, pneumonia, COVID-19, influenza, anemia, electrolyte abnormality, AKI, UTI, biliary colic, cholecystitis, dehydration.  Patient nontoxic-appearing and in no acute distress, vital signs are unremarkable.  I reviewed his chest x-ray from urgent care that showed mass versus infiltrate, we will further assess with CT imaging of his chest which will need to be done without contrast given his significant AKI.  No associated electrolyte abnormality noted and no significant anemia or leukocytosis noted.  2 sets of troponin are negative and I have low suspicion for ACS or PE.  Suspect AKI due to dehydration and we will give IV fluid bolus, patient declines nausea medication.  He does have some tenderness in the right upper quadrant on exam and we will further assess with right upper quadrant ultrasound.  Right upper quadrant ultrasound is unremarkable, chest CT shows developing infiltrate in the right lower lobe.  Procalcitonin mildly elevated consistent with developing pneumonia, will treat with IV Rocephin and azithromycin.  LFTs and lipase are unremarkable, case discussed with hospitalist for admission.      FINAL CLINICAL IMPRESSION(S) / ED DIAGNOSES   Final diagnoses:  Pneumonia of right lower lobe due to infectious organism  AKI (acute kidney injury) ([place])  Dehydration     Rx / DC Orders   ED Discharge Orders     None        Note:  This  document was prepared using Dragon voice recognition software and may include unintentional dictation errors.   Blake Divine, MD 11/11/22 [phone]

## 2022-11-11 NOTE — Assessment & Plan Note (Addendum)
Patient has CKD stage IIIa and chart review and problem list.  Abnormal kidney function is worse patient has had return to normal kidney function with normal creatinine intermittently in the past on chart review.   Renally dose  and avoid contrast.  ACE inhibitors, hold metformin.

## 2022-11-11 NOTE — Assessment & Plan Note (Signed)
IV PPI. 

## 2022-11-11 NOTE — ED Triage Notes (Signed)
Pt presents with vomiting, diarrhea and SOB x 2 days. Pt reports a cough last week.

## 2022-11-11 NOTE — Assessment & Plan Note (Signed)
Current creatinine is 2.8. No symptoms and resume meds once med reconciliation is available.

## 2022-11-11 NOTE — Hospital Course (Signed)
Dr. Charna Archer: 72 y/o viral infection earlier in the week with N/V/D and now can't keep anything down. Now has sob with cough. Infiltrate on chest ct suspect aspiration. [id number] normal. Covid and flu negative.

## 2022-11-11 NOTE — Assessment & Plan Note (Signed)
Supportive care with antiemetics, IV fluids, antitussives, antidiarrheals as needed. Maintenance IV fluid. Replacement of electrolytes. IV PPI therapy. Stool for GI PCR panel.

## 2022-11-11 NOTE — Assessment & Plan Note (Signed)
Vitals:   11/11/22 1130 11/11/22 1540 11/11/22 1730 11/11/22 1800  BP: 121/69 (!) 126/55 130/62 (!) 114/49   11/11/22 1830  BP: 136/68  Currently we will hold patient's atenolol, ramipril.  Monitor blood pressure overnight and restart same dose or lower dose as deemed appropriate.

## 2022-11-11 NOTE — Assessment & Plan Note (Addendum)
Replace and follow level.     Latest Ref Rng & Units 11/11/2022    1:29 PM 11/11/2022   11:42 AM 11/11/2022    9:51 AM  CMP  Glucose 70 - 99 mg/dL  160  156   BUN 8 - 23 mg/dL  49  49   Creatinine 0.61 - 1.24 mg/dL  2.80  3.25   Sodium 135 - 145 mmol/L  134  132   Potassium 3.5 - 5.1 mmol/L  4.1  3.8   Chloride 98 - 111 mmol/L  106  101   CO2 22 - 32 mmol/L  19  19   Calcium 8.9 - 10.3 mg/dL  8.1  8.2   Total Protein 6.5 - 8.1 g/dL 6.8   7.9   Total Bilirubin 0.3 - 1.2 mg/dL 0.3   0.4   Alkaline Phos 38 - 126 U/L 48   54   AST 15 - 41 U/L 23   26   ALT 0 - 44 U/L 27   31

## 2022-11-11 NOTE — Assessment & Plan Note (Addendum)
Home regimen includes  long acting insulin ,metformin, glipizide. Currently we will start patient on glycemic protocol. With Accu-Cheks every 4 hourly until he is able to tolerate p.o.

## 2022-11-12 ENCOUNTER — Encounter: Payer: Self-pay | Admitting: Internal Medicine

## 2022-11-12 DIAGNOSIS — J189 Pneumonia, unspecified organism: Secondary | ICD-10-CM | POA: Diagnosis not present

## 2022-11-12 DIAGNOSIS — N179 Acute kidney failure, unspecified: Secondary | ICD-10-CM | POA: Diagnosis not present

## 2022-11-12 DIAGNOSIS — E1122 Type 2 diabetes mellitus with diabetic chronic kidney disease: Secondary | ICD-10-CM

## 2022-11-12 DIAGNOSIS — N183 Chronic kidney disease, stage 3 unspecified: Secondary | ICD-10-CM | POA: Diagnosis not present

## 2022-11-12 LAB — CBC
HCT: 39.3 % (ref 39.0–52.0)
Hemoglobin: 13.2 g/dL (ref 13.0–17.0)
MCH: 29.9 pg (ref 26.0–34.0)
MCHC: 33.6 g/dL (ref 30.0–36.0)
MCV: 89.1 fL (ref 80.0–100.0)
Platelets: 228 10*3/uL (ref 150–400)
RBC: 4.41 MIL/uL (ref 4.22–5.81)
RDW: 13.1 % (ref 11.5–15.5)
WBC: 3.7 10*3/uL — ABNORMAL LOW (ref 4.0–10.5)
nRBC: 0 % (ref 0.0–0.2)

## 2022-11-12 LAB — COMPREHENSIVE METABOLIC PANEL
ALT: 21 U/L (ref 0–44)
AST: 19 U/L (ref 15–41)
Albumin: 2.9 g/dL — ABNORMAL LOW (ref 3.5–5.0)
Alkaline Phosphatase: 44 U/L (ref 38–126)
Anion gap: 6 (ref 5–15)
BUN: 39 mg/dL — ABNORMAL HIGH (ref 8–23)
CO2: 23 mmol/L (ref 22–32)
Calcium: 8 mg/dL — ABNORMAL LOW (ref 8.9–10.3)
Chloride: 108 mmol/L (ref 98–111)
Creatinine, Ser: 1.94 mg/dL — ABNORMAL HIGH (ref 0.61–1.24)
GFR, Estimated: 36 mL/min — ABNORMAL LOW (ref >60)
Glucose, Bld: 119 mg/dL — ABNORMAL HIGH (ref 70–99)
Potassium: 4.6 mmol/L (ref 3.5–5.1)
Sodium: 137 mmol/L (ref 135–145)
Total Bilirubin: 0.3 mg/dL (ref 0.3–1.2)
Total Protein: 5.7 g/dL — ABNORMAL LOW (ref 6.5–8.1)

## 2022-11-12 LAB — GLUCOSE, CAPILLARY
Glucose-Capillary: 106 mg/dL — ABNORMAL HIGH (ref 70–99)
Glucose-Capillary: 115 mg/dL — ABNORMAL HIGH (ref 70–99)
Glucose-Capillary: 131 mg/dL — ABNORMAL HIGH (ref 70–99)
Glucose-Capillary: 160 mg/dL — ABNORMAL HIGH (ref 70–99)
Glucose-Capillary: 184 mg/dL — ABNORMAL HIGH (ref 70–99)
Glucose-Capillary: 192 mg/dL — ABNORMAL HIGH (ref 70–99)

## 2022-11-12 LAB — HEMOGLOBIN A1C
Hgb A1c MFr Bld: 10 % — ABNORMAL HIGH (ref 4.8–5.6)
Mean Plasma Glucose: 240 mg/dL

## 2022-11-12 MED ORDER — ROSUVASTATIN CALCIUM 10 MG PO TABS
40.0000 mg | ORAL_TABLET | Freq: Every evening | ORAL | Status: DC
  Administered 2022-11-12: 40 mg via ORAL
  Filled 2022-11-12: qty 4

## 2022-11-12 MED ORDER — CLOPIDOGREL BISULFATE 75 MG PO TABS
75.0000 mg | ORAL_TABLET | Freq: Every day | ORAL | Status: DC
  Administered 2022-11-12 – 2022-11-13 (×2): 75 mg via ORAL
  Filled 2022-11-12 (×2): qty 1

## 2022-11-12 MED ORDER — ATENOLOL 25 MG PO TABS
50.0000 mg | ORAL_TABLET | Freq: Every day | ORAL | Status: DC
  Administered 2022-11-12 – 2022-11-13 (×2): 50 mg via ORAL
  Filled 2022-11-12 (×2): qty 2

## 2022-11-12 MED ORDER — AMITRIPTYLINE HCL 10 MG PO TABS
40.0000 mg | ORAL_TABLET | Freq: Every day | ORAL | Status: DC
  Filled 2022-11-12 (×3): qty 4

## 2022-11-12 MED ORDER — NEOMYCIN-POLYMYXIN-DEXAMETH 3.5-10000-0.1 OP SUSP
1.0000 [drp] | Freq: Four times a day (QID) | OPHTHALMIC | Status: DC
  Administered 2022-11-12 – 2022-11-13 (×5): 1 [drp] via OPHTHALMIC
  Filled 2022-11-12: qty 0.1

## 2022-11-12 MED ORDER — AMITRIPTYLINE HCL 25 MG PO TABS
50.0000 mg | ORAL_TABLET | Freq: Once | ORAL | Status: AC
  Administered 2022-11-12: 50 mg via ORAL
  Filled 2022-11-12: qty 2

## 2022-11-12 MED ORDER — SODIUM CHLORIDE 0.9 % IV SOLN: INTRAVENOUS | Status: AC

## 2022-11-12 MED ORDER — AMITRIPTYLINE HCL 10 MG PO TABS: 40.0000 mg | ORAL_TABLET | Freq: Every day | ORAL | Status: DC

## 2022-11-12 MED ORDER — PANTOPRAZOLE SODIUM 40 MG PO TBEC
40.0000 mg | DELAYED_RELEASE_TABLET | Freq: Every day | ORAL | Status: DC
  Administered 2022-11-13: 40 mg via ORAL
  Filled 2022-11-12: qty 1

## 2022-11-12 NOTE — TOC Progression Note (Signed)
Transition of Care [facility]) - Progression Note    Patient Details  Name: [name] MRN: [id number] Date of Birth: 05-Feb-1951  Transition of Care [facility]) CM/SW [place], RN Phone Number: 11/12/2022, 12:33 PM  Clinical Narrative:   Transition of Care [facility]) Screening Note   Patient Details  Name: [name] Date of Birth: 04-27-1951   Transition of Care [facility]) CM/SW Contact:    Carol Ada, RN Phone Number: 11/12/2022, 12:33 PM    Transition of Care Department [facility]) has reviewed patient and no TOC needs have been identified at this time. We will continue to monitor patient advancement through interdisciplinary progression rounds. If new patient transition needs arise, please place a TOC consult.      Expected Discharge Plan: Home/Self Care Barriers to Discharge: No Barriers Identified  Expected Discharge Plan and Services       Living arrangements for the past 2 months: Single Family Home                 DME Arranged: N/A DME Agency: NA       HH Arranged: NA           Social Determinants of Health (SDOH) Interventions SDOH Screenings   Food Insecurity: No Food Insecurity (11/11/2022)  Housing: Low Risk  (11/11/2022)  Transportation Needs: No Transportation Needs (11/11/2022)  Utilities: Not At Risk (11/11/2022)  Alcohol Screen: Medium Risk (05/19/2022)  Depression (PHQ2-9): Low Risk  (10/26/2022)  Financial Resource Strain: Low Risk  (05/19/2022)  Recent Concern: Financial Resource Strain - High Risk (04/27/2022)  Physical Activity: Inactive (05/19/2022)  Social Connections: Moderately Isolated (05/19/2022)  Stress: No Stress Concern Present (05/16/2021)  Tobacco Use: Medium Risk (11/12/2022)    Readmission Risk Interventions     No data to display

## 2022-11-12 NOTE — Evaluation (Signed)
Occupational Therapy Evaluation Patient Details Name: [name] MRN: [id number] DOB: 1950-09-28 Today's Date: 11/12/2022   History of Present Illness Pt is a 72 year old male admitted with CAP, n/v and diarrhea; PMH significant for  type 2 diabetes with CKD stage III secondary to the diabetes, hypertension, hyperlipidemia, BPH, and GERD   Clinical Impression   Chart reviewed, nurse cleared pt for participation in OT evaluation. Pt is alert and oriented x4, in agreement to OT evaluation. PTA pt is indep in ADL/IADL, amb with no AD. Pt presents with mild weakness however appears close to his baseline functional status. Pt performs ADL with supervision-MOD I, reports wife will be available to assist as needed. No OT needs identified at this time, pt is in agreement. Please re consult if there is a change in functional status.      Recommendations for follow up therapy are one component of a multi-disciplinary discharge planning process, led by the attending physician.  Recommendations may be updated based on patient status, additional functional criteria and insurance authorization.   Follow Up Recommendations  No OT follow up     Assistance Recommended at Discharge PRN  Patient can return home with the following Assistance with cooking/housework    Functional Status Assessment  Patient has had a recent decline in their functional status and demonstrates the ability to make significant improvements in function in a reasonable and predictable amount of time.  Equipment Recommendations  None recommended by OT    Recommendations for Other Services       Precautions / Restrictions Precautions Precautions: Fall Restrictions Weight Bearing Restrictions: No      Mobility Bed Mobility Overal bed mobility: Independent                  Transfers Overall transfer level: Needs assistance Equipment used: None Transfers: Sit to/from Stand Sit to Stand: Modified independent  (Device/Increase time), Supervision                  Balance Overall balance assessment: Needs assistance Sitting-balance support: Feet supported Sitting balance-Leahy Scale: Normal     Standing balance support: No upper extremity supported Standing balance-Leahy Scale: Good                             ADL either performed or assessed with clinical judgement   ADL Overall ADL's : Modified independent                                       General ADL Comments: grooming with MOD I-I standing at sink level, LB dressing with MOD I, toilet transfer with no DME use with supervision, amb approx 200' in hallway with supervision-MOD I     Vision Patient Visual Report: No change from baseline       Perception     Praxis      Pertinent Vitals/Pain Pain Assessment Pain Assessment: No/denies pain     Hand Dominance     Extremity/Trunk Assessment Upper Extremity Assessment Upper Extremity Assessment: Overall WFL for tasks assessed   Lower Extremity Assessment Lower Extremity Assessment: Overall WFL for tasks assessed       Communication Communication Communication: No difficulties   Cognition Arousal/Alertness: Awake/alert Behavior During Therapy: WFL for tasks assessed/performed Overall Cognitive Status: Within Functional Limits for tasks assessed  General Comments  spo2 >95% on RA during ambulation, HR 80 during ambulation    Exercises Other Exercises Other Exercises: edu re: role of OT, role of rehab, discharge recommendations, progressing activity, energy conservation techniques with good carry over and all questions answered within scope   Shoulder Instructions      Home Living Family/patient expects to be discharged to:: Private residence Living Arrangements: Spouse/significant other Available Help at Discharge: Family;Available 24 hours/day Type of Home: House Home  Access: Stairs to enter [employer] of Steps: 3   Home Layout: Multi-level;Able to live on main level with bedroom/bathroom Alternate Level Stairs-Number of Steps: 5 down to den (with L rail), 10 up to bedrooms (can live on main floor however if needed)   Bathroom Shower/Tub: Walk-in shower         Home Equipment: Conservation officer, nature (2 wheels);Cane - single point;BSC/3in1;Grab bars - tub/shower (DME from [facility] approx 2 yrs ago)          Prior Functioning/Environment Prior Level of Function : Independent/Modified Independent             Mobility Comments: amb with no AD, no history of falls ADLs Comments: indep in ADL/IADL, works part time in [employer]        OT Problem List:        OT Treatment/Interventions:      OT Goals(Current goals can be found in the care plan section) Acute Rehab OT Goals Patient Stated Goal: return home OT Goal Formulation: With patient Time For Goal Achievement: 11/26/22 Potential to Achieve Goals: Good  OT Frequency:      Co-evaluation              AM-PAC OT "6 Clicks" Daily Activity     Outcome Measure Help from another person eating meals?: None Help from another person taking care of personal grooming?: None Help from another person toileting, which includes using toliet, bedpan, or urinal?: None Help from another person bathing (including washing, rinsing, drying)?: None Help from another person to put on and taking off regular upper body clothing?: None Help from another person to put on and taking off regular lower body clothing?: None 6 Click Score: 24   End of Session Nurse Communication: Mobility status  Activity Tolerance: Patient tolerated treatment well Patient left: in chair;with call bell/phone within reach (with MD In room)                   Time: [id number] OT Time Calculation (min): 18 min Charges:  OT General Charges $OT Visit: 1 Visit OT Evaluation $OT Eval Low Complexity: 1 Low Shanon Payor,  OTD OTR/L  11/12/22, 10:27 AM

## 2022-11-12 NOTE — Plan of Care (Signed)
  Problem: Metabolic: Goal: Ability to maintain appropriate glucose levels will improve Outcome: Progressing   Problem: Fluid Volume: Goal: Ability to maintain a balanced intake and output will improve Outcome: Progressing   Problem: Clinical Measurements: Goal: Ability to maintain clinical measurements within normal limits will improve Outcome: Progressing   Problem: Elimination: Goal: Will not experience complications related to bowel motility Outcome: Progressing   Problem: Coping: Goal: Level of anxiety will decrease Outcome: Progressing   Problem: Nutrition: Goal: Adequate nutrition will be maintained Outcome: Progressing   Problem: Skin Integrity: Goal: Risk for impaired skin integrity will decrease Outcome: Progressing   Problem: Safety: Goal: Ability to remain free from injury will improve Outcome: Progressing   Problem: Pain Managment: Goal: General experience of comfort will improve Outcome: Progressing

## 2022-11-12 NOTE — Progress Notes (Signed)
PT Cancellation Note  Patient Details Name: [name] MRN: [place]:[id number] DOB: 12-Jul-1951   Cancelled Treatment:    Reason Eval/Treat Not Completed: PT screened, no needs identified, will sign off PT orders received, chart reviewed. Per OT, pt is mobilizing well & pt denies concerns re: mobility. At this time pt does not demonstrate any acute PT needs. PT to complete current orders. Please re-consult if new needs arise.  Lavone Nian, PT, DPT 11/12/22, 10:51 AM   [name] 11/12/2022, 10:46 AM

## 2022-11-12 NOTE — Progress Notes (Addendum)
Progress Note    [name]  [id number] DOB: Mar 21, 1951  DOA: 11/11/2022 PCP: [name], NP      Brief Narrative:    Medical records reviewed and are as summarized below:  [name] is a 72 y.o. male with medical history significant for CAD status postcoronary stent, hypertension, hyperlipidemia, artificial left eye, spinal stenosis, type II DM, CKD stage IIIa, who presented to the hospital with cough, shortness of breath, nausea, vomiting and diarrhea.  He said he had a cough about 2 weeks prior to admission.  He went to see his PCP and he was tested for COVID-19 infection but it came back negative.  Paxlovid that was initially prescribed was discontinued after COVID test came back negative.  He noticed shortness of breath with exertion about a week prior to admission.  Symptoms slowly worsened.  About 4 days prior to admission, he developed nausea, vomiting and diarrhea.  He only started taking Imodium about a day or 2 prior to admission.  Vomiting and diarrhea improved by the time he came to the hospital.   He was found to have acute kidney injury and probable community-acquired pneumonia.    Assessment/Plan:   Principal Problem:   CAP (community acquired pneumonia) Active Problems:   Nausea vomiting and diarrhea   Hyperlipidemia associated with type 2 diabetes mellitus ([place])   Hypertension associated with type 2 diabetes mellitus ([facility])   GERD (gastroesophageal reflux disease)   BPH (benign prostatic hyperplasia)   CKD stage 3 due to type 2 diabetes mellitus (HCC)   Type 2 diabetes mellitus with proteinuria (H[facility])   Electrolyte abnormality   Cough   AKI (acute kidney injury) ([place])    Body mass index is 25.09 kg/m.    Probable right-sided community-acquired pneumonia: Continue empiric IV ceftriaxone and azithromycin.  Follow-up blood cultures.   AKI on CKD stage IIIa: Creatinine is improving.  Creatinine was 3.25 on admission and is  down to 1.94.  Creatinine was 1.45 3 months prior to admission.  Continue IV fluids.  Repeat BMP tomorrow   Hyponatremia: Improving.  Repeat sodium level tomorrow   CAD with coronary stent: Continue Plavix   Type II DM with hyperglycemia: He takes [place] 80 units nightly.  Start insulin glargine at 30 units nightly for now given AKI and recent poor oral intake.  Continue NovoLog as needed for hyperglycemia.     Diet Order             Diet heart healthy/carb modified Room service appropriate? Yes; Fluid consistency: Thin  Diet effective now                            Consultants: None  Procedures: None    Medications:    amitriptyline  40 mg Oral QHS   aspirin EC  81 mg Oral Daily   atenolol  50 mg Oral Daily   clopidogrel  75 mg Oral Daily   docusate sodium  100 mg Oral BID   heparin  5,000 Units Subcutaneous Q8H   insulin aspart  0-15 Units Subcutaneous TID WC   neomycin-polymyxin b-dexamethasone  1 drop Left Eye QID   [START ON 11/13/2022] pantoprazole  40 mg Oral Daily   rosuvastatin  40 mg Oral QPM   Continuous Infusions:  sodium chloride 75 mL/hr at 11/12/22 1100   azithromycin (ZITHROMAX) 500 mg in sodium chloride 0.9 % 250 mL IVPB  cefTRIAXone (ROCEPHIN)  IV       Anti-infectives (From admission, onward)    Start     Dose/Rate Route Frequency Ordered Stop   11/12/22 1700  cefTRIAXone (ROCEPHIN) 1 g in sodium chloride 0.9 % 100 mL IVPB        1 g 200 mL/hr over 30 Minutes Intravenous Every 24 hours 11/11/22 1912     11/12/22 1700  azithromycin (ZITHROMAX) 500 mg in sodium chloride 0.9 % 250 mL IVPB        500 mg 250 mL/hr over 60 Minutes Intravenous Every 24 hours 11/11/22 1912     11/11/22 1715  cefTRIAXone (ROCEPHIN) 1 g in sodium chloride 0.9 % 100 mL IVPB        1 g 200 mL/hr over 30 Minutes Intravenous  Once 11/11/22 1713 11/11/22 1822   11/11/22 1715  azithromycin (ZITHROMAX) 500 mg in sodium chloride 0.9 % 250 mL IVPB         500 mg 250 mL/hr over 60 Minutes Intravenous  Once 11/11/22 1713 11/11/22 1857              Family Communication/Anticipated D/C date and plan/Code Status   DVT prophylaxis: heparin injection 5,000 Units Start: 11/11/22 2200 SCDs Start: 11/11/22 1911     Code Status: Full Code  Family Communication: None Disposition Plan: Plan to discharge home tomorrow   Status is: Inpatient Remains inpatient appropriate because: AKI       Subjective:   Interval events noted.  Patient was seen walking in the hallway with occupational therapist.  Cough is better.  No chest pain or shortness of breath.  Vomiting and diarrhea have improved.  He said he was not aware of any chronic kidney disease.  Objective:    Vitals:   11/12/22 0426 11/12/22 0816 11/12/22 1019 11/12/22 1102  BP: (!) 130/59 130/63  130/70  Pulse: 66 67  70  Resp: 17 15    Temp: (!) 97.5 F (36.4 C) 98.2 F (36.8 C)    TempSrc:  Oral    SpO2: 97% 97% 99% 99%  Weight:      Height:       No data found.   Intake/Output Summary (Last 24 hours) at 11/12/2022 1144 Last data filed at 11/12/2022 0641 Gross per 24 hour  Intake 266.81 ml  Output 200 ml  Net 66.81 ml   Filed Weights   11/11/22 1128  Weight: 83.9 kg    Exam:  GEN: NAD SKIN: No rash EYES: Artificial left eye ENT: MMM CV: RRR PULM: CTA B ABD: soft, ND, NT, +BS CNS: AAO x 3, non focal EXT: No edema or tenderness        Data Reviewed:   I have personally reviewed following labs and imaging studies:  Labs: Labs show the following:   Basic Metabolic Panel: Recent Labs  Lab 11/11/22 0951 11/11/22 1142 11/11/22 2055 11/12/22 0538  NA 132* 134*  --  137  K 3.8 4.1  --  4.6  CL 101 106  --  108  CO2 19* 19*  --  23  GLUCOSE 156* 160*  --  119*  BUN 49* 49*  --  39*  CREATININE 3.25* 2.80* 2.48* 1.94*  CALCIUM 8.2* 8.1*  --  8.0*   GFR Estimated Creatinine Clearance: 37.2 mL/min (A) (by C-G formula based on SCr of 1.94  mg/dL (H)). Liver Function Tests: Recent Labs  Lab 11/11/22 0951 11/11/22 1329 11/12/22 0538  AST 26 23 19  ALT '31 27 21  '$ ALKPHOS 54 48 44  BILITOT 0.4 0.3 0.3  PROT 7.9 6.8 5.7*  ALBUMIN 3.9 3.5 2.9*   Recent Labs  Lab 11/11/22 1329  LIPASE 39   No results for input(s): "AMMONIA" in the last 168 hours. Coagulation profile No results for input(s): "INR", "PROTIME" in the last 168 hours.  CBC: Recent Labs  Lab 11/11/22 0951 11/11/22 1142 11/12/22 0538  WBC 6.9 5.8 3.7*  NEUTROABS 4.8  --   --   HGB 16.6 15.2 13.2  HCT 48.8 47.5 39.3  MCV 89.5 91.2 89.1  PLT 365 330 228   Cardiac Enzymes: No results for input(s): "CKTOTAL", "CKMB", "CKMBINDEX", "TROPONINI" in the last 168 hours. BNP (last 3 results) No results for input(s): "PROBNP" in the last 8760 hours. CBG: Recent Labs  Lab 11/12/22 0004 11/12/22 0759  GLUCAP 192* 106*   D-Dimer: No results for input(s): "DDIMER" in the last 72 hours. Hgb A1c: No results for input(s): "HGBA1C" in the last 72 hours. Lipid Profile: No results for input(s): "CHOL", "HDL", "LDLCALC", "TRIG", "CHOLHDL", "LDLDIRECT" in the last 72 hours. Thyroid function studies: No results for input(s): "TSH", "T4TOTAL", "T3FREE", "THYROIDAB" in the last 72 hours.  Invalid input(s): "FREET3" Anemia work up: No results for input(s): "VITAMINB12", "FOLATE", "FERRITIN", "TIBC", "IRON", "RETICCTPCT" in the last 72 hours. Sepsis Labs: Recent Labs  Lab 11/11/22 0951 11/11/22 1142 11/11/22 1329 11/12/22 0538  PROCALCITON  --   --  0.84  --   WBC 6.9 5.8  --  3.7*    Microbiology Recent Results (from the past 240 hour(s))  Resp panel by RT-PCR (RSV, Flu A&B, Covid) Anterior Nasal Swab     Status: None   Collection Time: 11/11/22  4:44 PM   Specimen: Anterior Nasal Swab  Result Value Ref Range Status   SARS Coronavirus 2 by RT PCR NEGATIVE NEGATIVE Final    Comment: (NOTE) SARS-CoV-2 target nucleic acids are NOT DETECTED.  The  SARS-CoV-2 RNA is generally detectable in upper respiratory specimens during the acute phase of infection. The lowest concentration of SARS-CoV-2 viral copies this assay can detect is 138 copies/mL. A negative result does not preclude SARS-Cov-2 infection and should not be used as the sole basis for treatment or other patient management decisions. A negative result may occur with  improper specimen collection/handling, submission of specimen other than nasopharyngeal swab, presence of viral mutation(s) within the areas targeted by this assay, and inadequate number of viral copies(<138 copies/mL). A negative result must be combined with clinical observations, patient history, and epidemiological information. The expected result is Negative.  Fact Sheet for Patients:  EntrepreneurPulse.com.au  Fact Sheet for Healthcare Providers:  IncredibleEmployment.be  This test is no t yet approved or cleared by the [place] FDA and  has been authorized for detection and/or diagnosis of SARS-CoV-2 by FDA under an Emergency Use Authorization (EUA). This EUA will remain  in effect (meaning this test can be used) for the duration of the COVID-19 declaration under Section 564(b)(1) of the Act, 21 U.S.C.section 360bbb-3(b)(1), unless the authorization is terminated  or revoked sooner.       Influenza A by PCR NEGATIVE NEGATIVE Final   Influenza B by PCR NEGATIVE NEGATIVE Final    Comment: (NOTE) The Xpert Xpress SARS-CoV-2/FLU/RSV plus assay is intended as an aid in the diagnosis of influenza from Nasopharyngeal swab specimens and should not be used as a sole basis for treatment. Nasal washings and aspirates are unacceptable for Xpert Xpress  SARS-CoV-2/FLU/RSV testing.  Fact Sheet for Patients: EntrepreneurPulse.com.au  Fact Sheet for Healthcare Providers: IncredibleEmployment.be  This test is not yet approved or  cleared by the [place] FDA and has been authorized for detection and/or diagnosis of SARS-CoV-2 by FDA under an Emergency Use Authorization (EUA). This EUA will remain in effect (meaning this test can be used) for the duration of the COVID-19 declaration under Section 564(b)(1) of the Act, 21 U.S.C. section 360bbb-3(b)(1), unless the authorization is terminated or revoked.     Resp Syncytial Virus by PCR NEGATIVE NEGATIVE Final    Comment: (NOTE) Fact Sheet for Patients: EntrepreneurPulse.com.au  Fact Sheet for Healthcare Providers: IncredibleEmployment.be  This test is not yet approved or cleared by the [place] FDA and has been authorized for detection and/or diagnosis of SARS-CoV-2 by FDA under an Emergency Use Authorization (EUA). This EUA will remain in effect (meaning this test can be used) for the duration of the COVID-19 declaration under Section 564(b)(1) of the Act, 21 U.S.C. section 360bbb-3(b)(1), unless the authorization is terminated or revoked.  Performed at [facility] [place], [place] [address]   C Difficile Quick Screen w PCR reflex     Status: None   Collection Time: 11/11/22  8:30 PM   Specimen: Stool  Result Value Ref Range Status   C Diff antigen NEGATIVE NEGATIVE Final   C Diff toxin NEGATIVE NEGATIVE Final   C Diff interpretation No C. difficile detected.  Final    Comment: Performed at [facility], [address] [place], [place] [address]    Procedures and diagnostic studies:  US Abdomen Limited RUQ (LIVER/GB)  Result Date: 11/11/2022 CLINICAL DATA:  Right upper quadrant pain for week. Previous cholecystectomy EXAM: ULTRASOUND ABDOMEN LIMITED RIGHT UPPER QUADRANT COMPARISON:  CT abdomen 09/20/2014 FINDINGS: Gallbladder: Gallbladder is surgically absent as per history Common bile duct: Diameter: 4 mm Liver: Diffusely echogenic hepatic parenchyma consistent with  fatty liver infiltration. With this level of echogenicity evaluation for underlying mass lesion is limited and if needed follow-up contrast CT or MRI as clinically directed. Portal vein is patent on color Doppler imaging with normal direction of blood flow towards the liver. Other: None. IMPRESSION: Previous cholecystectomy.  No ductal dilatation. Fatty liver infiltration Electronically Signed   By: Jill Side M.D.   On: 11/11/2022 16:48   CT CHEST WO CONTRAST  Result Date: 11/11/2022 CLINICAL DATA:  Pneumonia EXAM: CT CHEST WITHOUT CONTRAST TECHNIQUE: Multidetector CT imaging of the chest was performed following the standard protocol without IV contrast. RADIATION DOSE REDUCTION: This exam was performed according to the departmental dose-optimization program which includes automated exposure control, adjustment of the mA and/or kV according to patient size and/or use of iterative reconstruction technique. COMPARISON:  Chest x-ray 11/03/2022 earlier and older exams. FINDINGS: Cardiovascular: Status post median sternotomy. On this non IV contrast exam there is no pericardial effusion. The heart is nonenlarged. The thoracic aorta grossly has a normal course and caliber on this noncontrast examination with scattered vascular calcifications. Particularly there are significant coronary artery calcifications. Known bypass grafts. Mediastinum/Nodes: No specific abnormal lymph node enlargement present in the axillary regions, hilum or mediastinum. Small thyroid gland. Normal caliber thoracic esophagus. Lungs/Pleura: Slight breathing motion. No consolidation, pneumothorax or effusion. There is minimal interstitial thickening focally with some ground-glass along the superior segment right lower lobe medially. There also some small punctate calcified nodules identified such as right upper lobe on series 4, image 59 consistent with old granulomatous disease.  Additional focus on image 78 on series 4 in the right lung  anteriorly. Upper Abdomen: Minimal thickening of the right adrenal gland. Left adrenal gland is preserved. Musculoskeletal: Mild degenerative changes along the spine. IMPRESSION: Small area of ill-defined ground-glass interstitial thickening along the superior segment of the right lower lobe medially. No consolidation, pneumothorax or effusion. Recommend follow up in 3 months. Evidence of old granulomatous disease. Aortic Atherosclerosis (ICD10-I70.0). Electronically Signed   By: Jill Side M.D.   On: 11/11/2022 16:35   DG Chest 2 View  Result Date: 11/11/2022 CLINICAL DATA:  Cough EXAM: CHEST - 2 VIEW COMPARISON:  CXR 03/26/20 FINDINGS: Status post median sternotomy and CABG. No pleural effusion. No pneumothorax. Compared to prior exam there is a hazy opacity along paratracheal stripe on the right. Normal cardiac and mediastinal contours. Vertebral body heights are maintained. Surgical clips in the right upper quadrant. There may also be an adjacent rib fracture. IMPRESSION: Hazy opacity along the paratracheal stripe on the right, new since prior exam. This could be due to overlapping soft tissues, but an underlying airspace of mediastinal mass is not excluded. Recommend further evaluation with a chest CT. There may also be an adjacent rib fracture. Electronically Signed   By: Marin Roberts M.D.   On: 11/11/2022 10:01               LOS: 1 day   Hazelyn Kallen  [facility]   Pager on [email or web address]. If 7PM-7AM, please contact night-coverage at [email or web address]     11/12/2022, 11:44 AM

## 2022-11-13 DIAGNOSIS — R197 Diarrhea, unspecified: Secondary | ICD-10-CM

## 2022-11-13 DIAGNOSIS — R112 Nausea with vomiting, unspecified: Secondary | ICD-10-CM

## 2022-11-13 LAB — CBC WITH DIFFERENTIAL/PLATELET
Abs Immature Granulocytes: 0.02 10*3/uL (ref 0.00–0.07)
Basophils Absolute: 0 10*3/uL (ref 0.0–0.1)
Basophils Relative: 1 %
Eosinophils Absolute: 0.2 10*3/uL (ref 0.0–0.5)
Eosinophils Relative: 4 %
HCT: 37.8 % — ABNORMAL LOW (ref 39.0–52.0)
Hemoglobin: 12.9 g/dL — ABNORMAL LOW (ref 13.0–17.0)
Immature Granulocytes: 1 %
Lymphocytes Relative: 36 %
Lymphs Abs: 1.5 10*3/uL (ref 0.7–4.0)
MCH: 29.9 pg (ref 26.0–34.0)
MCHC: 34.1 g/dL (ref 30.0–36.0)
MCV: 87.5 fL (ref 80.0–100.0)
Monocytes Absolute: 0.3 10*3/uL (ref 0.1–1.0)
Monocytes Relative: 8 %
Neutro Abs: 2.1 10*3/uL (ref 1.7–7.7)
Neutrophils Relative %: 50 %
Platelets: 208 10*3/uL (ref 150–400)
RBC: 4.32 MIL/uL (ref 4.22–5.81)
RDW: 12.8 % (ref 11.5–15.5)
WBC: 4.1 10*3/uL (ref 4.0–10.5)
nRBC: 0 % (ref 0.0–0.2)

## 2022-11-13 LAB — BASIC METABOLIC PANEL
Anion gap: 8 (ref 5–15)
BUN: 29 mg/dL — ABNORMAL HIGH (ref 8–23)
CO2: 21 mmol/L — ABNORMAL LOW (ref 22–32)
Calcium: 8.2 mg/dL — ABNORMAL LOW (ref 8.9–10.3)
Chloride: 108 mmol/L (ref 98–111)
Creatinine, Ser: 1.41 mg/dL — ABNORMAL HIGH (ref 0.61–1.24)
GFR, Estimated: 53 mL/min — ABNORMAL LOW (ref >60)
Glucose, Bld: 124 mg/dL — ABNORMAL HIGH (ref 70–99)
Potassium: 3.9 mmol/L (ref 3.5–5.1)
Sodium: 137 mmol/L (ref 135–145)

## 2022-11-13 LAB — GLUCOSE, CAPILLARY
Glucose-Capillary: 124 mg/dL — ABNORMAL HIGH (ref 70–99)
Glucose-Capillary: 132 mg/dL — ABNORMAL HIGH (ref 70–99)

## 2022-11-13 MED ORDER — CEFDINIR 300 MG PO CAPS
300.0000 mg | ORAL_CAPSULE | Freq: Two times a day (BID) | ORAL | Status: DC
  Filled 2022-11-13: qty 1

## 2022-11-13 MED ORDER — CEFDINIR 300 MG PO CAPS: 300.0000 mg | ORAL_CAPSULE | Freq: Two times a day (BID) | ORAL | 0 refills | Status: AC

## 2022-11-13 NOTE — Progress Notes (Signed)
Reviewed discharge with pt. Pt verbalized understanding. Iv cath intact when removed. Pt discharged with all personal belongings. Staff wheeled pt out. Pt transported to home via family vehicle.

## 2022-11-13 NOTE — Plan of Care (Signed)
  Problem: Education: Goal: Knowledge of General Education information will improve Description: Including pain rating scale, medication(s)/side effects and non-pharmacologic comfort measures Outcome: Progressing   Problem: Health Behavior/Discharge Planning: Goal: Ability to manage health-related needs will improve Outcome: Progressing   Problem: Clinical Measurements: Goal: Ability to maintain clinical measurements within normal limits will improve Outcome: Progressing Goal: Diagnostic test results will improve Outcome: Progressing   Problem: Nutrition: Goal: Adequate nutrition will be maintained Outcome: Progressing   Problem: Coping: Goal: Level of anxiety will decrease Outcome: Progressing   Problem: Elimination: Goal: Will not experience complications related to bowel motility Outcome: Progressing Goal: Will not experience complications related to urinary retention Outcome: Progressing

## 2022-11-13 NOTE — Care Management Important Message (Signed)
Important Message  Patient Details  Name: [name] MRN: [id number] Date of Birth: 03-17-1951   Medicare Important Message Given:  N/A - LOS <3 / Initial given by admissions     [name] 11/13/2022, 8:00 AM

## 2022-11-13 NOTE — Discharge Summary (Addendum)
Physician Discharge Summary   Patient: [name] MRN: [id number] DOB: 1950-10-28  Admit date:     11/11/2022  Discharge date: 11/13/22  Discharge Physician: Jennye Boroughs   PCP: [name], NP   Recommendations at discharge:   Follow-up with PCP in 1 to 2 weeks  Discharge Diagnoses: Principal Problem:   CAP (community acquired pneumonia) Active Problems:   Nausea vomiting and diarrhea   Hyperlipidemia associated with type 2 diabetes mellitus ([place])   Hypertension associated with type 2 diabetes mellitus (HCC)   GERD (gastroesophageal reflux disease)   BPH (benign prostatic hyperplasia)   CKD stage 3 due to type 2 diabetes mellitus (HCC)   Type 2 diabetes mellitus with proteinuria (HCC)   Electrolyte abnormality   Cough   AKI (acute kidney injury) ([place])  Resolved Problems:   * No resolved hospital problems. *  Hospital Course:   [name] is a 72 y.o. male with medical history significant for CAD status postcoronary stent, hypertension, hyperlipidemia, artificial left eye, spinal stenosis, type II DM, CKD stage IIIa, who presented to the hospital with cough, shortness of breath, nausea, vomiting and diarrhea.  He said he had a cough about 2 weeks prior to admission.  He went to see his PCP and he was tested for COVID-19 infection but it came back negative.  Paxlovid that was initially prescribed was discontinued after COVID test came back negative.  He noticed shortness of breath with exertion about a week prior to admission.  Symptoms slowly worsened.  About 4 days prior to admission, he developed nausea, vomiting and diarrhea.  He only started taking Imodium about a day or 2 prior to admission.  Vomiting and diarrhea improved by the time he came to the hospital.     He was found to have acute kidney injury, hyponatremia and probable community-acquired pneumonia.      Assessment and Plan:   Probable right-sided community-acquired pneumonia:  Improved.  Discharged on 3-day course of Omnicef    AKI on CKD stage IIIa: Creatinine has improved to 1.4 which is around his baseline.  Patient said he was not aware of any chronic kidney disease diagnosis.  Diagnosis and management of CKD were explained to the patient and his wife at the bedside.  Adequate management of hypertension and diabetes is essential in slowing down the progression of chronic kidney disease.     Hyponatremia: Resolved     CAD with coronary stent: Continue Plavix and rosuvastatin     Type II DM with hyperglycemia: Continue Tresiba, metformin and glipizide at discharge.    Recent vomiting and diarrhea: Resolved   His condition has improved and he is deemed stable for discharge to home today.  Discharge plan was discussed with the patient and his wife at the bedside.       Consultants: None Procedures performed: None  Disposition: Home Diet recommendation:  Discharge Diet Orders (From admission, onward)     Start     Ordered   11/13/22 0000  Diet - low sodium heart healthy        11/13/22 1045   11/13/22 0000  Diet Carb Modified        11/13/22 1045           Cardiac and Carb modified diet DISCHARGE MEDICATION: Allergies as of 11/13/2022       Reactions   Trulicity [dulaglutide] Nausea Only   Penicillins Rash   Childhood. Did not require medical care. Childhood. Did not require  medical care. Childhood. Did not require medical care.        Medication List     STOP taking these medications    benzonatate 100 MG capsule Commonly known as: Tessalon Perles       TAKE these medications    Accu-Chek Guide test strip Generic drug: glucose blood 1 each by Other route 2 times daily at 12 noon and 4 pm.   amitriptyline 10 MG tablet Commonly known as: ELAVIL Take 4 tablets (40 mg total) by mouth at bedtime.   aspirin EC 81 MG tablet Take 81 mg by mouth daily.   atenolol 50 MG tablet Commonly known as: TENORMIN Take 1 tablet  (50 mg total) by mouth daily.   cefdinir 300 MG capsule Commonly known as: OMNICEF Take 1 capsule (300 mg total) by mouth every 12 (twelve) hours for 5 doses.   clopidogrel 75 MG tablet Commonly known as: PLAVIX Take 1 tablet (75 mg total) by mouth daily.   cyanocobalamin 1000 MCG tablet Commonly known as: VITAMIN B12 Take 1,000 mcg by mouth daily.   fenofibrate 145 MG tablet Commonly known as: TRICOR Take 1 tablet (145 mg total) by mouth daily.   glipiZIDE 2.5 MG 24 hr tablet Commonly known as: GLUCOTROL XL Take 10 mg by mouth daily with breakfast.   metFORMIN 500 MG tablet Commonly known as: GLUCOPHAGE TAKE 2 TABLETS TWICE DAILY WITH MEALS   neomycin-polymyxin-dexamethasone 0.1 % ophthalmic suspension Commonly known as: MAXITROL Place 1 drop into the left eye 4 (four) times daily.   nitroGLYCERIN 0.4 MG SL tablet Commonly known as: NITROSTAT Place 0.4 mg under the tongue every 5 (five) minutes as needed.   omega-3 acid ethyl esters 1 g capsule Commonly known as: LOVAZA Take 2 capsules (2 g total) by mouth 2 (two) times daily.   pantoprazole 20 MG tablet Commonly known as: PROTONIX TAKE 1 TABLET EVERY DAY (DISCONTINUE '40MG'$  DOSE)   ramipril 1.25 MG capsule Commonly known as: ALTACE Take 1 capsule (1.25 mg total) by mouth daily.   rosuvastatin 40 MG tablet Commonly known as: CRESTOR Take 1 tablet (40 mg total) by mouth daily.   [name] FlexTouch 100 UNIT/ML FlexTouch Pen Generic drug: insulin degludec Inject 80 Units into the skin at bedtime.        Discharge Exam: Filed Weights   11/11/22 1128  Weight: 83.9 kg   GEN: NAD SKIN: No rash EYES: No pallor or icterus ENT: MMM CV: RRR PULM: CTA B ABD: soft, ND, NT, +BS CNS: AAO x 3, non focal EXT: No edema or tenderness   Condition at discharge: good  The results of significant diagnostics from this hospitalization (including imaging, microbiology, ancillary and laboratory) are listed below for  reference.   Imaging Studies: US Abdomen Limited RUQ (LIVER/GB)  Result Date: 11/11/2022 CLINICAL DATA:  Right upper quadrant pain for week. Previous cholecystectomy EXAM: ULTRASOUND ABDOMEN LIMITED RIGHT UPPER QUADRANT COMPARISON:  CT abdomen 09/20/2014 FINDINGS: Gallbladder: Gallbladder is surgically absent as per history Common bile duct: Diameter: 4 mm Liver: Diffusely echogenic hepatic parenchyma consistent with fatty liver infiltration. With this level of echogenicity evaluation for underlying mass lesion is limited and if needed follow-up contrast CT or MRI as clinically directed. Portal vein is patent on color Doppler imaging with normal direction of blood flow towards the liver. Other: None. IMPRESSION: Previous cholecystectomy.  No ductal dilatation. Fatty liver infiltration Electronically Signed   By: Jill Side M.D.   On: 11/11/2022 16:48   CT CHEST  WO CONTRAST  Result Date: 11/11/2022 CLINICAL DATA:  Pneumonia EXAM: CT CHEST WITHOUT CONTRAST TECHNIQUE: Multidetector CT imaging of the chest was performed following the standard protocol without IV contrast. RADIATION DOSE REDUCTION: This exam was performed according to the departmental dose-optimization program which includes automated exposure control, adjustment of the mA and/or kV according to patient size and/or use of iterative reconstruction technique. COMPARISON:  Chest x-ray 11/03/2022 earlier and older exams. FINDINGS: Cardiovascular: Status post median sternotomy. On this non IV contrast exam there is no pericardial effusion. The heart is nonenlarged. The thoracic aorta grossly has a normal course and caliber on this noncontrast examination with scattered vascular calcifications. Particularly there are significant coronary artery calcifications. Known bypass grafts. Mediastinum/Nodes: No specific abnormal lymph node enlargement present in the axillary regions, hilum or mediastinum. Small thyroid gland. Normal caliber thoracic  esophagus. Lungs/Pleura: Slight breathing motion. No consolidation, pneumothorax or effusion. There is minimal interstitial thickening focally with some ground-glass along the superior segment right lower lobe medially. There also some small punctate calcified nodules identified such as right upper lobe on series 4, image 2 consistent with old granulomatous disease. Additional focus on image 78 on series 4 in the right lung anteriorly. Upper Abdomen: Minimal thickening of the right adrenal gland. Left adrenal gland is preserved. Musculoskeletal: Mild degenerative changes along the spine. IMPRESSION: Small area of ill-defined ground-glass interstitial thickening along the superior segment of the right lower lobe medially. No consolidation, pneumothorax or effusion. Recommend follow up in 3 months. Evidence of old granulomatous disease. Aortic Atherosclerosis (ICD10-I70.0). Electronically Signed   By: Jill Side M.D.   On: 11/11/2022 16:35   DG Chest 2 View  Result Date: 11/11/2022 CLINICAL DATA:  Cough EXAM: CHEST - 2 VIEW COMPARISON:  CXR 03/26/20 FINDINGS: Status post median sternotomy and CABG. No pleural effusion. No pneumothorax. Compared to prior exam there is a hazy opacity along paratracheal stripe on the right. Normal cardiac and mediastinal contours. Vertebral body heights are maintained. Surgical clips in the right upper quadrant. There may also be an adjacent rib fracture. IMPRESSION: Hazy opacity along the paratracheal stripe on the right, new since prior exam. This could be due to overlapping soft tissues, but an underlying airspace of mediastinal mass is not excluded. Recommend further evaluation with a chest CT. There may also be an adjacent rib fracture. Electronically Signed   By: Marin Roberts M.D.   On: 11/11/2022 10:01    Microbiology: Results for orders placed or performed during the hospital encounter of 11/11/22  Resp panel by RT-PCR (RSV, Flu A&B, Covid) Anterior Nasal Swab      Status: None   Collection Time: 11/11/22  4:44 PM   Specimen: Anterior Nasal Swab  Result Value Ref Range Status   SARS Coronavirus 2 by RT PCR NEGATIVE NEGATIVE Final    Comment: (NOTE) SARS-CoV-2 target nucleic acids are NOT DETECTED.  The SARS-CoV-2 RNA is generally detectable in upper respiratory specimens during the acute phase of infection. The lowest concentration of SARS-CoV-2 viral copies this assay can detect is 138 copies/mL. A negative result does not preclude SARS-Cov-2 infection and should not be used as the sole basis for treatment or other patient management decisions. A negative result may occur with  improper specimen collection/handling, submission of specimen other than nasopharyngeal swab, presence of viral mutation(s) within the areas targeted by this assay, and inadequate number of viral copies(<138 copies/mL). A negative result must be combined with clinical observations, patient history, and epidemiological information. The expected  result is Negative.  Fact Sheet for Patients:  EntrepreneurPulse.com.au  Fact Sheet for Healthcare Providers:  IncredibleEmployment.be  This test is no t yet approved or cleared by the [place] FDA and  has been authorized for detection and/or diagnosis of SARS-CoV-2 by FDA under an Emergency Use Authorization (EUA). This EUA will remain  in effect (meaning this test can be used) for the duration of the COVID-19 declaration under Section 564(b)(1) of the Act, 21 U.S.C.section 360bbb-3(b)(1), unless the authorization is terminated  or revoked sooner.       Influenza A by PCR NEGATIVE NEGATIVE Final   Influenza B by PCR NEGATIVE NEGATIVE Final    Comment: (NOTE) The Xpert Xpress SARS-CoV-2/FLU/RSV plus assay is intended as an aid in the diagnosis of influenza from Nasopharyngeal swab specimens and should not be used as a sole basis for treatment. Nasal washings and aspirates are  unacceptable for Xpert Xpress SARS-CoV-2/FLU/RSV testing.  Fact Sheet for Patients: EntrepreneurPulse.com.au  Fact Sheet for Healthcare Providers: IncredibleEmployment.be  This test is not yet approved or cleared by the [place] FDA and has been authorized for detection and/or diagnosis of SARS-CoV-2 by FDA under an Emergency Use Authorization (EUA). This EUA will remain in effect (meaning this test can be used) for the duration of the COVID-19 declaration under Section 564(b)(1) of the Act, 21 U.S.C. section 360bbb-3(b)(1), unless the authorization is terminated or revoked.     Resp Syncytial Virus by PCR NEGATIVE NEGATIVE Final    Comment: (NOTE) Fact Sheet for Patients: EntrepreneurPulse.com.au  Fact Sheet for Healthcare Providers: IncredibleEmployment.be  This test is not yet approved or cleared by the [place] FDA and has been authorized for detection and/or diagnosis of SARS-CoV-2 by FDA under an Emergency Use Authorization (EUA). This EUA will remain in effect (meaning this test can be used) for the duration of the COVID-19 declaration under Section 564(b)(1) of the Act, 21 U.S.C. section 360bbb-3(b)(1), unless the authorization is terminated or revoked.  Performed at [facility], [place], [place] [address]   C Difficile Quick Screen w PCR reflex     Status: None   Collection Time: 11/11/22  8:30 PM   Specimen: Stool  Result Value Ref Range Status   C Diff antigen NEGATIVE NEGATIVE Final   C Diff toxin NEGATIVE NEGATIVE Final   C Diff interpretation No C. difficile detected.  Final    Comment: Performed at [facility], [address] [place], [place] [address]    Labs: CBC: Recent Labs  Lab 11/11/22 [phone] 11/11/22 1142 11/12/22 0538 11/13/22 0325  WBC 6.9 5.8 3.7* 4.1  NEUTROABS 4.8  --   --  2.1  HGB 16.6 15.2 13.2 12.9*  HCT 48.8 47.5  39.3 37.8*  MCV 89.5 91.2 89.1 87.5  PLT 365 330 228 [id number]   Basic Metabolic Panel: Recent Labs  Lab 11/11/22 0951 11/11/22 1142 11/11/22 2055 11/12/22 0538 11/13/22 0325  NA 132* 134*  --  137 137  K 3.8 4.1  --  4.6 3.9  CL 101 106  --  108 108  CO2 19* 19*  --  23 21*  GLUCOSE 156* 160*  --  119* 124*  BUN 49* 49*  --  39* 29*  CREATININE 3.25* 2.80* 2.48* 1.94* 1.41*  CALCIUM 8.2* 8.1*  --  8.0* 8.2*   Liver Function Tests: Recent Labs  Lab 11/11/22 0951 11/11/22 1329 11/12/22 0538  AST '26 23 19  '$ ALT 31 27 21  ALKPHOS 54 48 44  BILITOT 0.4 0.3 0.3  PROT 7.9 6.8 5.7*  ALBUMIN 3.9 3.5 2.9*   CBG: Recent Labs  Lab 11/12/22 1715 11/12/22 1950 11/12/22 2324 11/13/22 0522 11/13/22 0830  GLUCAP 184* 131* 115* 124* 132*    Discharge time spent: greater than 30 minutes.  Signed: Jennye Boroughs, MD [facility] 11/13/2022

## 2022-11-13 NOTE — Plan of Care (Signed)
  Problem: Activity: Goal: Ability to tolerate increased activity will improve Outcome: Adequate for Discharge   Problem: Clinical Measurements: Goal: Ability to maintain a body temperature in the normal range will improve Outcome: Adequate for Discharge   Problem: Respiratory: Goal: Ability to maintain adequate ventilation will improve Outcome: Adequate for Discharge Goal: Ability to maintain a clear airway will improve Outcome: Adequate for Discharge   Problem: Education: Goal: Ability to describe self-care measures that may prevent or decrease complications (Diabetes Survival Skills Education) will improve Outcome: Adequate for Discharge Goal: Individualized Educational Video(s) Outcome: Adequate for Discharge   Problem: Coping: Goal: Ability to adjust to condition or change in health will improve Outcome: Adequate for Discharge   Problem: Fluid Volume: Goal: Ability to maintain a balanced intake and output will improve Outcome: Adequate for Discharge   Problem: Health Behavior/Discharge Planning: Goal: Ability to identify and utilize available resources and services will improve Outcome: Adequate for Discharge Goal: Ability to manage health-related needs will improve Outcome: Adequate for Discharge   Problem: Metabolic: Goal: Ability to maintain appropriate glucose levels will improve Outcome: Adequate for Discharge   Problem: Nutritional: Goal: Maintenance of adequate nutrition will improve Outcome: Adequate for Discharge Goal: Progress toward achieving an optimal weight will improve Outcome: Adequate for Discharge   Problem: Skin Integrity: Goal: Risk for impaired skin integrity will decrease Outcome: Adequate for Discharge   Problem: Tissue Perfusion: Goal: Adequacy of tissue perfusion will improve Outcome: Adequate for Discharge   Problem: Education: Goal: Knowledge of General Education information will improve Description: Including pain rating scale,  medication(s)/side effects and non-pharmacologic comfort measures Outcome: Adequate for Discharge   Problem: Health Behavior/Discharge Planning: Goal: Ability to manage health-related needs will improve Outcome: Adequate for Discharge   Problem: Clinical Measurements: Goal: Ability to maintain clinical measurements within normal limits will improve Outcome: Adequate for Discharge Goal: Will remain free from infection Outcome: Adequate for Discharge Goal: Diagnostic test results will improve Outcome: Adequate for Discharge Goal: Respiratory complications will improve Outcome: Adequate for Discharge Goal: Cardiovascular complication will be avoided Outcome: Adequate for Discharge   Problem: Activity: Goal: Risk for activity intolerance will decrease Outcome: Adequate for Discharge   Problem: Nutrition: Goal: Adequate nutrition will be maintained Outcome: Adequate for Discharge   Problem: Coping: Goal: Level of anxiety will decrease Outcome: Adequate for Discharge   Problem: Elimination: Goal: Will not experience complications related to bowel motility Outcome: Adequate for Discharge Goal: Will not experience complications related to urinary retention Outcome: Adequate for Discharge   Problem: Pain Managment: Goal: General experience of comfort will improve Outcome: Adequate for Discharge   Problem: Safety: Goal: Ability to remain free from injury will improve Outcome: Adequate for Discharge   Problem: Skin Integrity: Goal: Risk for impaired skin integrity will decrease Outcome: Adequate for Discharge

## 2022-11-16 ENCOUNTER — Telehealth: Payer: Self-pay | Admitting: *Deleted

## 2022-11-16 ENCOUNTER — Telehealth: Payer: Self-pay

## 2022-11-16 NOTE — Progress Notes (Cosign Needed)
Not marked as in error

## 2022-11-16 NOTE — Transitions of Care (Post Inpatient/ED Visit) (Signed)
   11/16/2022  Name: Jamie Balliett MRN: [place]:[id number] DOB: [birth year]  Today's TOC FU Call Status: Today's TOC FU Call Status:: Successful TOC FU Call Competed TOC FU Call Complete Date: 11/16/22  Transition Care Management Follow-up Telephone Call  completed Items Reviewed: Did you receive and understand the discharge instructions provided?: Yes Medications obtained and verified?: Yes (Medications Reviewed) Any new allergies since your discharge?: No Dietary orders reviewed?: Yes Type of Diet Ordered:: low sodium heart healthy carb modified Do you have support at home?: Yes People in Home: spouse  Home Care and Equipment/Supplies: [place] Ordered?: No Any new equipment or medical supplies ordered?: No  Functional Questionnaire: Do you need assistance with bathing/showering or dressing?: No Do you need assistance with meal preparation?: No Do you need assistance with eating?: No Do you have difficulty maintaining continence: No Do you need assistance with getting out of bed/getting out of a chair/moving?: No Do you have difficulty managing or taking your medications?: No  Folllow up appointments reviewed: PCP Follow-up appointment confirmed?: No (careguide called PCP and PCP will call patient) MD Provider Line Number:[phone] Given: No Specialist Hospital Follow-up appointment confirmed?: NA Do you need transportation to your follow-up appointment?: No Do you understand care options if your condition(s) worsen?: Yes-patient verbalized understanding  SDOH Interventions Today    Flowsheet Row Most Recent Value  SDOH Interventions   Food Insecurity Interventions Intervention Not Indicated  Housing Interventions Intervention Not Indicated  Transportation Interventions Intervention Not Indicated      TOC Interventions Today    Flowsheet Row Most Recent Value  TOC Interventions   TOC Interventions Discussed/Reviewed TOC Interventions Discussed, TOC  Interventions Reviewed      Interventions Today    Flowsheet Row Most Recent Value  Chronic Disease   Chronic disease during today's visit Diabetes  [referred to [facility] coordination for f/u with A1c 10.0]  General Interventions   General Interventions Discussed/Reviewed General Interventions Discussed, General Interventions Reviewed, Doctor Visits  [RN discessed diet/ making sure he took all the atbx/ referral to care cordination for management of diabetes]  Doctor Visits Discussed/Reviewed Doctor Visits Reviewed  [Referred to care guide for PCP f/u appt/ She called Dr office and they are to call patient back]  Nutrition Interventions   Nutrition Discussed/Reviewed Nutrition Discussed  [Low sodium heart healthy carb modified diet]     Patient consented to follow up outreach with Valente David to Develop Plan of care for management of diabetes. Patient A1C is 10.0  [place] [facility] [phone]

## 2022-11-20 ENCOUNTER — Encounter: Payer: Medicare PPO | Admitting: *Deleted

## 2022-11-20 ENCOUNTER — Encounter: Payer: Self-pay | Admitting: Nurse Practitioner

## 2022-11-20 ENCOUNTER — Ambulatory Visit (INDEPENDENT_AMBULATORY_CARE_PROVIDER_SITE_OTHER): Payer: Medicare PPO | Admitting: Nurse Practitioner

## 2022-11-20 VITALS — BP 136/64 | HR 65 | Temp 98.0°F | Ht 72.01 in | Wt 187.2 lb

## 2022-11-20 DIAGNOSIS — N179 Acute kidney failure, unspecified: Secondary | ICD-10-CM | POA: Diagnosis not present

## 2022-11-20 DIAGNOSIS — S2231XA Fracture of one rib, right side, initial encounter for closed fracture: Secondary | ICD-10-CM

## 2022-11-20 DIAGNOSIS — J189 Pneumonia, unspecified organism: Secondary | ICD-10-CM

## 2022-11-20 DIAGNOSIS — S2239XA Fracture of one rib, unspecified side, initial encounter for closed fracture: Secondary | ICD-10-CM | POA: Insufficient documentation

## 2022-11-20 NOTE — Assessment & Plan Note (Signed)
Acute with pneumonia.  Recommend he continue deep breathing exercises at home.  May take Tylenol as needed.  No Ibuprofen.  Apply heat or Icy/Hot as needed.

## 2022-11-20 NOTE — Assessment & Plan Note (Signed)
Acute and improving.  He denies any further symptoms.  Plan on recheck of CXR in 6 weeks.  Labs: CBC and CMP today.

## 2022-11-20 NOTE — Progress Notes (Signed)
BP 136/64 (BP Location: Left Arm, Patient Position: Sitting, Cuff Size: Normal)   Pulse 65   Temp 98 F (36.7 C) (Oral)   Ht 6' 0.01" (1.829 m)   Wt 187 lb 3.2 oz (84.9 kg)   SpO2 98%   BMI 25.38 kg/m    Subjective:    Patient ID: [name], male    DOB: 01-Jan-1951, 72 y.o.   MRN: [id number]  HPI: [name] is a 72 y.o. male  Chief Complaint  Patient presents with   Hospitalization Follow-up    Dx'd 11/13/22 with a high creatinine level.    Flank Pain    Right side pain from coughing   Transition of Care Hospital Follow up.  Presents today for hospital follow-up after recent admission for pneumonia on 11/11/22, discharged on 11/13/22.  A1c in hospital was 10%, he is followed by endocrinology -- returns in April for visit with them.  Negative for Covid, Flu, and RSV.  Had been ill on 10/26/22 after exposure to Covid and was treated.  On CT imaging noted a right lower lobe PNA.  On CXR there was a possible rib fracture right lower.  On initial admission to hospital his creatinine had trended up to 3.25 from baseline CKD 3a and eGFR 19 -- had emesis and diarrhea at time, was given fluids in hospital and levels improved.  Overall he reports feeling better at this time.  Has very rare cough, dry cough.   Has mild soreness on right side, area of rib fracture.  Not taking any medications for this.  He is performing deep breathing exercises at home.     "Hospital Course:   [name] is a 72 y.o. male with medical history significant for CAD status postcoronary stent, hypertension, hyperlipidemia, artificial left eye, spinal stenosis, type II DM, CKD stage IIIa, who presented to the hospital with cough, shortness of breath, nausea, vomiting and diarrhea.  He said he had a cough about 2 weeks prior to admission.  He went to see his PCP and he was tested for COVID-19 infection but it came back negative.  Paxlovid that was initially prescribed was discontinued after COVID  test came back negative.  He noticed shortness of breath with exertion about a week prior to admission.  Symptoms slowly worsened. About 4 days prior to admission, he developed nausea, vomiting and diarrhea. He only started taking Imodium about a day or 2 prior to admission.  Vomiting and diarrhea improved by the time he came to the hospital.   He was found to have acute kidney injury, hyponatremia and probable community-acquired pneumonia.   Assessment and Plan:   Probable right-sided community-acquired pneumonia: Improved.  Discharged on 3-day course of Omnicef     AKI on CKD stage IIIa: Creatinine has improved to 1.4 which is around his baseline.  Patient said he was not aware of any chronic kidney disease diagnosis.  Diagnosis and management of CKD were explained to the patient and his wife at the bedside.  Adequate management of hypertension and diabetes is essential in slowing down the progression of chronic kidney disease.       Hyponatremia: Resolved     CAD with coronary stent: Continue Plavix and rosuvastatin     Type II DM with hyperglycemia: Continue Tresiba, metformin and glipizide at discharge.     Recent vomiting and diarrhea: Resolved     His condition has improved and he is deemed stable for discharge to home today.  Discharge plan was discussed with the patient and his wife at the bedside."  Hospital/Facility: [facility] D/C Physician: Dr. Mal Misty D/C Date: 11/13/22  Records Requested: 11/20/22 Records Received: 11/20/22 Records Reviewed: 11/20/22  Diagnoses on Discharge: Pneumonia and AKI  Date of interactive Contact within 48 hours of discharge:  Contact was through: phone  Date of 7 day or 14 day face-to-face visit:    within 7 days  Outpatient Encounter Medications as of 11/20/2022  Medication Sig Note   ACCU-CHEK GUIDE test strip 1 each by Other route 2 times daily at 12 noon and 4 pm.    amitriptyline (ELAVIL) 10 MG tablet Take 4 tablets (40 mg total) by mouth at  bedtime.    aspirin EC 81 MG tablet Take 81 mg by mouth daily.     atenolol (TENORMIN) 50 MG tablet Take 1 tablet (50 mg total) by mouth daily.    clopidogrel (PLAVIX) 75 MG tablet Take 1 tablet (75 mg total) by mouth daily.    fenofibrate (TRICOR) 145 MG tablet Take 1 tablet (145 mg total) by mouth daily.    glipiZIDE (GLUCOTROL XL) 2.5 MG 24 hr tablet Take 10 mg by mouth daily with breakfast. 11/11/2022: Patient has been increased to 10 mg once daily, he's still using 2.5 mg just taking 4 tablets once daily.   insulin degludec (TRESIBA FLEXTOUCH) 100 UNIT/ML FlexTouch Pen Inject 80 Units into the skin at bedtime.    metFORMIN (GLUCOPHAGE) 500 MG tablet TAKE 2 TABLETS TWICE DAILY WITH MEALS    neomycin-polymyxin-dexamethasone (MAXITROL) 0.1 % ophthalmic suspension Place 1 drop into the left eye 4 (four) times daily.    nitroGLYCERIN (NITROSTAT) 0.4 MG SL tablet Place 0.4 mg under the tongue every 5 (five) minutes as needed.    omega-3 acid ethyl esters (LOVAZA) 1 g capsule Take 2 capsules (2 g total) by mouth 2 (two) times daily.    pantoprazole (PROTONIX) 20 MG tablet TAKE 1 TABLET EVERY DAY (DISCONTINUE '40MG'$  DOSE)    ramipril (ALTACE) 1.25 MG capsule Take 1 capsule (1.25 mg total) by mouth daily.    rosuvastatin (CRESTOR) 40 MG tablet Take 1 tablet (40 mg total) by mouth daily.    vitamin B-12 (CYANOCOBALAMIN) 1000 MCG tablet Take 1,000 mcg by mouth daily.    No facility-administered encounter medications on file as of 11/20/2022.    Diagnostic Tests Reviewed/Disposition: Reviewed on chart with imaging  Consults: none  Discharge Instructions: Follow-up with PCP  Disease/illness Education: Reviewed with patient  Home Health/Community Services Discussions/Referrals: None  Establishment or re-establishment of referral orders for community resources: None  Discussion with other health care providers: Reviewed in chart  Assessment and Support of treatment regimen adherence: Reviewed with  patient  Appointments Coordinated with: Reviewed with patient  Education for self-management, independent living, and ADLs:  Reviewed with patient  Relevant past medical, surgical, family and social history reviewed and updated as indicated. Interim medical history since our last visit reviewed. Allergies and medications reviewed and updated.  Review of Systems  Constitutional:  Negative for activity change, appetite change, chills, diaphoresis, fatigue and fever.  HENT: Negative.    Respiratory:  Negative for cough, chest tightness, shortness of breath and wheezing.   Cardiovascular:  Negative for chest pain, palpitations and leg swelling.  Gastrointestinal: Negative.   Neurological: Negative.   Psychiatric/Behavioral: Negative.      Per HPI unless specifically indicated above     Objective:    BP 136/64 (BP Location: Left Arm, Patient Position: Sitting,  Cuff Size: Normal)   Pulse 65   Temp 98 F (36.7 C) (Oral)   Ht 6' 0.01" (1.829 m)   Wt 187 lb 3.2 oz (84.9 kg)   SpO2 98%   BMI 25.38 kg/m   Wt Readings from Last 3 Encounters:  11/20/22 187 lb 3.2 oz (84.9 kg)  11/11/22 185 lb (83.9 kg)  10/26/22 185 lb 9.6 oz (84.2 kg)    Physical Exam Vitals and nursing note reviewed.  Constitutional:      General: He is awake. He is not in acute distress.    Appearance: He is well-developed and well-groomed. He is not ill-appearing or toxic-appearing.  HENT:     Head: Normocephalic and atraumatic.     Right Ear: Hearing, tympanic membrane, ear canal and external ear normal. No drainage.     Left Ear: Hearing, tympanic membrane, ear canal and external ear normal. No drainage.     Nose:     Right Sinus: No maxillary sinus tenderness or frontal sinus tenderness.     Left Sinus: No maxillary sinus tenderness or frontal sinus tenderness.     Mouth/Throat:     Pharynx: Uvula midline. No pharyngeal swelling, oropharyngeal exudate or posterior oropharyngeal erythema.  Eyes:      General: Lids are normal.        Right eye: No discharge.        Left eye: No discharge.     Extraocular Movements: Extraocular movements intact.     Conjunctiva/sclera: Conjunctivae normal.     Pupils: Pupils are equal, round, and reactive to light.     Visual Fields: Right eye visual fields normal and left eye visual fields normal.  Neck:     Thyroid: No thyromegaly.     Vascular: No carotid bruit or JVD.     Trachea: Trachea normal.  Cardiovascular:     Rate and Rhythm: Normal rate and regular rhythm.     Heart sounds: Normal heart sounds, S1 normal and S2 normal. No murmur heard.    No gallop.  Pulmonary:     Effort: Pulmonary effort is normal. No accessory muscle usage or respiratory distress.     Breath sounds: Normal breath sounds.  Abdominal:     General: Bowel sounds are normal. There is no distension.     Palpations: Abdomen is soft.     Tenderness: There is no abdominal tenderness.  Musculoskeletal:        General: Normal range of motion.     Cervical back: Normal range of motion and neck supple.     Right lower leg: No edema.     Left lower leg: No edema.  Lymphadenopathy:     Head:     Right side of head: No submental, submandibular, tonsillar, preauricular or posterior auricular adenopathy.     Left side of head: No submental, submandibular, tonsillar, preauricular or posterior auricular adenopathy.     Cervical: No cervical adenopathy.  Skin:    General: Skin is warm and dry.     Capillary Refill: Capillary refill takes less than 2 seconds.     Findings: No rash.  Neurological:     Mental Status: He is alert and oriented to person, place, and time.     Gait: Gait is intact.     Deep Tendon Reflexes: Reflexes are normal and symmetric.     Reflex Scores:      Brachioradialis reflexes are 2+ on the right side and 2+ on the left side.  Patellar reflexes are 2+ on the right side and 2+ on the left side. Psychiatric:        Attention and Perception: Attention  normal.        Mood and Affect: Mood normal.        Speech: Speech normal.        Behavior: Behavior normal. Behavior is cooperative.        Thought Content: Thought content normal.        Cognition and Memory: Cognition normal.    Results for orders placed or performed during the hospital encounter of 11/11/22  Resp panel by RT-PCR (RSV, Flu A&B, Covid) Anterior Nasal Swab   Specimen: Anterior Nasal Swab  Result Value Ref Range   SARS Coronavirus 2 by RT PCR NEGATIVE NEGATIVE   Influenza A by PCR NEGATIVE NEGATIVE   Influenza B by PCR NEGATIVE NEGATIVE   Resp Syncytial Virus by PCR NEGATIVE NEGATIVE  C Difficile Quick Screen w PCR reflex   Specimen: Stool  Result Value Ref Range   C Diff antigen NEGATIVE NEGATIVE   C Diff toxin NEGATIVE NEGATIVE   C Diff interpretation No C. difficile detected.   Basic metabolic panel  Result Value Ref Range   Sodium 134 (L) 135 - 145 mmol/L   Potassium 4.1 3.5 - 5.1 mmol/L   Chloride 106 98 - 111 mmol/L   CO2 19 (L) 22 - 32 mmol/L   Glucose, Bld 160 (H) 70 - 99 mg/dL   BUN 49 (H) 8 - 23 mg/dL   Creatinine, Ser 2.80 (H) 0.61 - 1.24 mg/dL   Calcium 8.1 (L) 8.9 - 10.3 mg/dL   GFR, Estimated 23 (L) >60 mL/min   Anion gap 9 5 - 15  CBC  Result Value Ref Range   WBC 5.8 4.0 - 10.5 K/uL   RBC 5.21 4.22 - 5.81 MIL/uL   Hemoglobin 15.2 13.0 - 17.0 g/dL   HCT 47.5 39.0 - 52.0 %   MCV 91.2 80.0 - 100.0 fL   MCH 29.2 26.0 - 34.0 pg   MCHC 32.0 30.0 - 36.0 g/dL   RDW 13.1 11.5 - 15.5 %   Platelets 330 150 - 400 K/uL   nRBC 0.0 0.0 - 0.2 %  Hepatic function panel  Result Value Ref Range   Total Protein 6.8 6.5 - 8.1 g/dL   Albumin 3.5 3.5 - 5.0 g/dL   AST 23 15 - 41 U/L   ALT 27 0 - 44 U/L   Alkaline Phosphatase 48 38 - 126 U/L   Total Bilirubin 0.3 0.3 - 1.2 mg/dL   Bilirubin, Direct <0.1 0.0 - 0.2 mg/dL   Indirect Bilirubin NOT CALCULATED 0.3 - 0.9 mg/dL  Lipase, blood  Result Value Ref Range   Lipase 39 11 - 51 U/L  Procalcitonin   Result Value Ref Range   Procalcitonin 0.84 ng/mL  Hemoglobin A1c  Result Value Ref Range   Hgb A1c MFr Bld 10.0 (H) 4.8 - 5.6 %   Mean Plasma Glucose 240 mg/dL  Creatinine, serum  Result Value Ref Range   Creatinine, Ser 2.48 (H) 0.61 - 1.24 mg/dL   GFR, Estimated 27 (L) >60 mL/min  Comprehensive metabolic panel  Result Value Ref Range   Sodium 137 135 - 145 mmol/L   Potassium 4.6 3.5 - 5.1 mmol/L   Chloride 108 98 - 111 mmol/L   CO2 23 22 - 32 mmol/L   Glucose, Bld 119 (H) 70 - 99 mg/dL   BUN  39 (H) 8 - 23 mg/dL   Creatinine, Ser 1.94 (H) 0.61 - 1.24 mg/dL   Calcium 8.0 (L) 8.9 - 10.3 mg/dL   Total Protein 5.7 (L) 6.5 - 8.1 g/dL   Albumin 2.9 (L) 3.5 - 5.0 g/dL   AST 19 15 - 41 U/L   ALT 21 0 - 44 U/L   Alkaline Phosphatase 44 38 - 126 U/L   Total Bilirubin 0.3 0.3 - 1.2 mg/dL   GFR, Estimated 36 (L) >60 mL/min   Anion gap 6 5 - 15  CBC  Result Value Ref Range   WBC 3.7 (L) 4.0 - 10.5 K/uL   RBC 4.41 4.22 - 5.81 MIL/uL   Hemoglobin 13.2 13.0 - 17.0 g/dL   HCT 39.3 39.0 - 52.0 %   MCV 89.1 80.0 - 100.0 fL   MCH 29.9 26.0 - 34.0 pg   MCHC 33.6 30.0 - 36.0 g/dL   RDW 13.1 11.5 - 15.5 %   Platelets 228 150 - 400 K/uL   nRBC 0.0 0.0 - 0.2 %  Glucose, capillary  Result Value Ref Range   Glucose-Capillary 192 (H) 70 - 99 mg/dL  Glucose, capillary  Result Value Ref Range   Glucose-Capillary 106 (H) 70 - 99 mg/dL  Glucose, capillary  Result Value Ref Range   Glucose-Capillary 160 (H) 70 - 99 mg/dL  Basic metabolic panel  Result Value Ref Range   Sodium 137 135 - 145 mmol/L   Potassium 3.9 3.5 - 5.1 mmol/L   Chloride 108 98 - 111 mmol/L   CO2 21 (L) 22 - 32 mmol/L   Glucose, Bld 124 (H) 70 - 99 mg/dL   BUN 29 (H) 8 - 23 mg/dL   Creatinine, Ser 1.41 (H) 0.61 - 1.24 mg/dL   Calcium 8.2 (L) 8.9 - 10.3 mg/dL   GFR, Estimated 53 (L) >60 mL/min   Anion gap 8 5 - 15  CBC with Differential/Platelet  Result Value Ref Range   WBC 4.1 4.0 - 10.5 K/uL   RBC 4.32 4.22 -  5.81 MIL/uL   Hemoglobin 12.9 (L) 13.0 - 17.0 g/dL   HCT 37.8 (L) 39.0 - 52.0 %   MCV 87.5 80.0 - 100.0 fL   MCH 29.9 26.0 - 34.0 pg   MCHC 34.1 30.0 - 36.0 g/dL   RDW 12.8 11.5 - 15.5 %   Platelets 208 150 - 400 K/uL   nRBC 0.0 0.0 - 0.2 %   Neutrophils Relative % 50 %   Neutro Abs 2.1 1.7 - 7.7 K/uL   Lymphocytes Relative 36 %   Lymphs Abs 1.5 0.7 - 4.0 K/uL   Monocytes Relative 8 %   Monocytes Absolute 0.3 0.1 - 1.0 K/uL   Eosinophils Relative 4 %   Eosinophils Absolute 0.2 0.0 - 0.5 K/uL   Basophils Relative 1 %   Basophils Absolute 0.0 0.0 - 0.1 K/uL   Immature Granulocytes 1 %   Abs Immature Granulocytes 0.02 0.00 - 0.07 K/uL  Glucose, capillary  Result Value Ref Range   Glucose-Capillary 184 (H) 70 - 99 mg/dL  Glucose, capillary  Result Value Ref Range   Glucose-Capillary 131 (H) 70 - 99 mg/dL  Glucose, capillary  Result Value Ref Range   Glucose-Capillary 115 (H) 70 - 99 mg/dL  Glucose, capillary  Result Value Ref Range   Glucose-Capillary 124 (H) 70 - 99 mg/dL  Glucose, capillary  Result Value Ref Range   Glucose-Capillary 132 (H) 70 - 99 mg/dL  Troponin  I (High Sensitivity)  Result Value Ref Range   Troponin I (High Sensitivity) 8 <18 ng/L  Troponin I (High Sensitivity)  Result Value Ref Range   Troponin I (High Sensitivity) 6 <18 ng/L      Assessment & Plan:   Problem List Items Addressed This Visit       Respiratory   CAP (community acquired pneumonia)    Acute and improving.  He denies any further symptoms.  Plan on recheck of CXR in 6 weeks.  Labs: CBC and CMP today.      Relevant Orders   CBC with Differential/Platelet   Comprehensive metabolic panel     Musculoskeletal and Integument   Rib fracture    Acute with pneumonia.  Recommend he continue deep breathing exercises at home.  May take Tylenol as needed.  No Ibuprofen.  Apply heat or Icy/Hot as needed.        Genitourinary   AKI (acute kidney injury) ([place]) - Primary    Acute and  improving prior to hospital discharge.  Overall patient reports feeling better.  Recheck CBC and CMP today.        Follow up plan: Return in about 1 month (around 12/23/2022) for Pneumonia follow-up with repeat CXR.

## 2022-11-20 NOTE — Patient Instructions (Signed)

## 2022-11-20 NOTE — Assessment & Plan Note (Signed)
Acute and improving prior to hospital discharge.  Overall patient reports feeling better.  Recheck CBC and CMP today.

## 2022-11-21 LAB — CBC WITH DIFFERENTIAL/PLATELET
Basophils Absolute: 0 10*3/uL (ref 0.0–0.2)
Basos: 0 %
EOS (ABSOLUTE): 0.3 10*3/uL (ref 0.0–0.4)
Eos: 6 %
Hematocrit: 39 % (ref 37.5–51.0)
Hemoglobin: 12.9 g/dL — ABNORMAL LOW (ref 13.0–17.7)
Immature Grans (Abs): 0 10*3/uL (ref 0.0–0.1)
Immature Granulocytes: 0 %
Lymphocytes Absolute: 1.2 10*3/uL (ref 0.7–3.1)
Lymphs: 25 %
MCH: 29.9 pg (ref 26.6–33.0)
MCHC: 33.1 g/dL (ref 31.5–35.7)
MCV: 90 fL (ref 79–97)
Monocytes Absolute: 0.3 10*3/uL (ref 0.1–0.9)
Monocytes: 6 %
Neutrophils Absolute: 2.9 10*3/uL (ref 1.4–7.0)
Neutrophils: 63 %
Platelets: 242 10*3/uL (ref 150–450)
RBC: 4.32 x10E6/uL (ref 4.14–5.80)
RDW: 13.4 % (ref 11.6–15.4)
WBC: 4.7 10*3/uL (ref 3.4–10.8)

## 2022-11-21 LAB — COMPREHENSIVE METABOLIC PANEL
ALT: 17 IU/L (ref 0–44)
AST: 21 IU/L (ref 0–40)
Albumin/Globulin Ratio: 2.3 — ABNORMAL HIGH (ref 1.2–2.2)
Albumin: 4.2 g/dL (ref 3.8–4.8)
Alkaline Phosphatase: 75 IU/L (ref 44–121)
BUN/Creatinine Ratio: 8 — ABNORMAL LOW (ref 10–24)
BUN: 12 mg/dL (ref 8–27)
Bilirubin Total: 0.3 mg/dL (ref 0.0–1.2)
CO2: 22 mmol/L (ref 20–29)
Calcium: 9.1 mg/dL (ref 8.6–10.2)
Chloride: 102 mmol/L (ref 96–106)
Creatinine, Ser: 1.42 mg/dL — ABNORMAL HIGH (ref 0.76–1.27)
Globulin, Total: 1.8 g/dL (ref 1.5–4.5)
Glucose: 152 mg/dL — ABNORMAL HIGH (ref 70–99)
Potassium: 5.1 mmol/L (ref 3.5–5.2)
Sodium: 137 mmol/L (ref 134–144)
Total Protein: 6 g/dL (ref 6.0–8.5)
eGFR: 52 mL/min/{1.73_m2} — ABNORMAL LOW (ref >59)

## 2022-11-21 NOTE — Progress Notes (Signed)
Contacted via [place] evening [facility], your labs have returned.  CBC continues to show a mildly low hemoglobin we will recheck next visit, but hematocrit improved.  We monitor these to ensure no anemia.  Kidney function shows your baseline chronic kidney disease stage 3a - no worsening.  Good news!!  Ensure plenty of water intake daily.  Any questions? Keep being amazing!!  Thank you for allowing me to participate in your care.  I appreciate you. Kindest regards, Safa Derner

## 2022-12-04 ENCOUNTER — Encounter: Payer: Self-pay | Admitting: *Deleted

## 2022-12-04 ENCOUNTER — Ambulatory Visit: Payer: Self-pay | Admitting: *Deleted

## 2022-12-04 NOTE — Patient Outreach (Signed)
  Care Coordination   Initial Visit Note   12/04/2022 Name: [name] MRN: [id number] DOB: 1950/09/18  [name] is a 72 y.o. year old male who sees Cannady, Henrine Screws T, NP for primary care. I spoke with  [name] by phone today.  What matters to the patients health and wellness today?  Finding out where and when to have chest x-ray and bringing A1C down.    Goals Addressed             This Visit's Progress    Effective management of DM       Care Coordination Interventions: Provided education to patient about basic DM disease process Reviewed medications with patient and discussed importance of medication adherence Counseled on importance of regular laboratory monitoring as prescribed Discussed plans with patient for ongoing care management follow up and provided patient with direct contact information for care management team Provided patient with written educational materials related to hypo and hyperglycemia and importance of correct treatment Reviewed scheduled/upcoming provider appointments including: PCP on 4/8 and endocrine on 4/19 Advised patient, providing education and rationale, to check cbg daily and record, calling provider for findings outside established parameters Screening for signs and symptoms of depression related to chronic disease state  Assessed social determinant of health barriers Reviewed current A1C of 10, goal less than 7 Discussed monitoring blood sugar, state today was 112 Report he need chest x-ray prior to PCP follow up but unsure when to go.  Will collaborate with PCP to confirm details.          SDOH assessments and interventions completed:  No     Care Coordination Interventions:  Yes, provided   Interventions Today    Flowsheet Row Most Recent Value  Chronic Disease   Chronic disease during today's visit Diabetes, Other  [PNE]  General Interventions   General Interventions Discussed/Reviewed General  Interventions Reviewed, Labs, Doctor Visits, Communication with  Labs Hgb A1c every 3 months  Doctor Visits Discussed/Reviewed Doctor Visits Discussed, PCP  PCP/Specialist Visits Compliance with follow-up visit  Communication with PCP/Specialists  Education Interventions   Education Provided Provided Education  Provided Verbal Education On Nutrition, When to see the doctor, Blood Sugar Monitoring, Labs  Labs Reviewed Hgb A1c, Kidney Function  Nutrition Interventions   Nutrition Discussed/Reviewed Decreasing sugar intake, Adding fruits and vegetables, Nutrition Reviewed       Follow up plan: Follow up call scheduled for 3/28    Encounter Outcome:  Pt. Visit Completed   Valente David, RN, MSN, [place] Care Management Care Management Coordinator [phone]

## 2022-12-04 NOTE — Patient Instructions (Signed)
Visit Information  Thank you for taking time to visit with me today. Please don't hesitate to contact me if I can be of assistance to you before our next scheduled telephone appointment.  Following are the goals we discussed today:  Monitor blood sugar daily and follow diabetic diet  Our next appointment is by telephone on 3/28  Please call the care guide team at [phone] if you need to cancel or reschedule your appointment.   Please call the Suicide and Crisis Lifeline: 988 call the [place] National Suicide Prevention Lifeline: 807-607-5525 or TTY: (414)302-6516 TTY 502 795 5517) to talk to a trained counselor call 1-800-273-TALK (toll free, 24 hour hotline) call 911 if you are experiencing a Mental Health or [place] or need someone to talk to.  Patient verbalizes understanding of instructions and care plan provided today and agrees to view in [place]. Active MyChart status and patient understanding of how to access instructions and care plan via MyChart confirmed with patient.     The patient has been provided with contact information for the care management team and has been advised to call with any health related questions or concerns.   Valente David, RN, MSN, [place] Care Management Care Management Coordinator [phone]

## 2022-12-07 ENCOUNTER — Telehealth: Payer: Self-pay

## 2022-12-07 NOTE — Telephone Encounter (Signed)
Spoke with patient, he knew exactly where to go for CXR. Informed him to have done prior to his visit on 4/8. Patient verbalized understanding

## 2022-12-07 NOTE — Telephone Encounter (Signed)
-----   Message from [name], NP sent at 12/04/2022  7:01 PM EDT ----- Tammy: Can you reach out to him on Monday and provide address for [facility] imaging.:) Thank you. ----- Message ----- From: Valente David, RN Sent: 12/04/2022   6:23 PM EDT To: [name], NP  Good evening, Mr. Quintana stated he needed to have a CXR prior to seeing you again on 4/8 but he wasn't sure when or where to go.  Would someone be able to follow up with him on this to provide details? Thanks, Valente David, RN, MSN, [place] Care Management Care Management Coordinator [phone]

## 2022-12-10 ENCOUNTER — Ambulatory Visit: Payer: Self-pay | Admitting: *Deleted

## 2022-12-10 NOTE — Patient Outreach (Signed)
  Care Coordination   Follow Up Visit Note   12/10/2022 Name: [name] MRN: [id number] DOB: [birth year]  [name] is a 72 y.o. year old male who sees Cannady, Henrine Screws T, NP for primary care. I spoke with  [name] by phone today.  What matters to the patients health and wellness today?  Obtaining CXR prior to PCP visit. Denies any urgent concerns, encouraged to contact this care manager with questions.     Goals Addressed             This Visit's Progress    Effective management of DM       Care Coordination Interventions: Provided education to patient about basic DM disease process Reviewed medications with patient and discussed importance of medication adherence Counseled on importance of regular laboratory monitoring as prescribed Discussed plans with patient for ongoing care management follow up and provided patient with direct contact information for care management team Provided patient with written educational materials related to hypo and hyperglycemia and importance of correct treatment Reviewed scheduled/upcoming provider appointments including: PCP on 4/8 and endocrine on 4/19 Advised patient, providing education and rationale, to check cbg daily and record, calling provider for findings outside established parameters Reviewed current A1C of 10, goal less than 7 Discussed monitoring blood sugar, state today was 112 Report he need chest x-ray prior to PCP follow up but unsure when to go.  Will collaborate with PCP to confirm details. - Report he has confirmed details for CXR, will have it done next week         SDOH assessments and interventions completed:  No     Care Coordination Interventions:  Yes, provided   Interventions Today    Flowsheet Row Most Recent Value  General Interventions   General Interventions Discussed/Reviewed General Interventions Reviewed, Doctor Visits  Doctor Visits Discussed/Reviewed Doctor Visits Reviewed,  PCP  PCP/Specialist Visits Compliance with follow-up visit       Follow up plan: Follow up call scheduled for 4/26    Encounter Outcome:  Pt. Visit Completed   Valente David, RN, MSN, [place] Care Management Care Management Coordinator [phone]

## 2022-12-11 ENCOUNTER — Other Ambulatory Visit: Payer: Self-pay | Admitting: Nurse Practitioner

## 2022-12-11 MED ORDER — NEOMYCIN-POLYMYXIN-DEXAMETH 0.1 % OP SUSP: 1.0000 [drp] | Freq: Four times a day (QID) | OPHTHALMIC | 12 refills | Status: DC

## 2022-12-11 NOTE — Telephone Encounter (Signed)
Medication Refill - Medication: neomycin-polymyxin-dexamethasone (MAXITROL) 0.1 % ophthalmic suspension   Has the patient contacted their pharmacy? Yes.   [name] with [facility] is calling to request the medication refill.   Preferred Pharmacy (with phone number or street name):  [place], [place] Phone: [phone]  Fax: [phone]     Has the patient been seen for an appointment in the last year OR does the patient have an upcoming appointment? Yes.    Agent: Please be advised that RX refills may take up to 3 business days. We ask that you follow-up with your pharmacy.

## 2022-12-11 NOTE — Telephone Encounter (Signed)
Requested medications are due for refill today.  unsure  Requested medications are on the active medications list.  yes  Last refill. 10/10/2021 38mL 12 rf  Future visit scheduled.   yes  Notes to clinic.  Refill not delegated    Requested Prescriptions  Pending Prescriptions Disp Refills   neomycin-polymyxin-dexamethasone (MAXITROL) 0.1 % ophthalmic suspension 5 mL 12    Sig: Place 1 drop into the left eye 4 (four) times daily.     Not Delegated - Ophthalmology:  Corticosteroid & Antimicrobial Combinations Failed - 12/11/2022  4:37 PM      Failed - This refill cannot be delegated      Passed - Valid encounter within last 12 months    Recent Outpatient Visits           3 weeks ago AKI (acute kidney injury) ([place])   [place], Henrine Screws T, NP   1 month ago Sinus congestion   [place], [place] T, NP   4 months ago Type 2 diabetes mellitus with proteinuria ([place])   [place], [place] T, NP   6 months ago Type 2 diabetes mellitus with hyperglycemia, with long-term current use of insulin ([place])   [place], [place] T, NP   8 months ago Type 2 diabetes mellitus with hyperglycemia, with long-term current use of insulin ([place])   [place], Barbaraann Faster, NP       Future Appointments             In 1 week Cannady, Barbaraann Faster, NP [place], [place]   In 1 month [place], Barbaraann Faster, NP [place], [name]

## 2022-12-17 ENCOUNTER — Ambulatory Visit
Admission: RE | Admit: 2022-12-17 | Discharge: 2022-12-17 | Disposition: A | Discharge status: Home / Self Care | Payer: Medicare PPO | Source: Ambulatory Visit | Attending: Family Medicine | Admitting: Family Medicine

## 2022-12-17 ENCOUNTER — Other Ambulatory Visit: Payer: Self-pay | Admitting: Nurse Practitioner

## 2022-12-17 ENCOUNTER — Ambulatory Visit
Admission: RE | Admit: 2022-12-17 | Discharge: 2022-12-17 | Disposition: A | Discharge status: Home / Self Care | Payer: Medicare PPO | Attending: Family Medicine | Admitting: Family Medicine

## 2022-12-17 ENCOUNTER — Other Ambulatory Visit: Payer: Self-pay | Admitting: Family Medicine

## 2022-12-17 DIAGNOSIS — J189 Pneumonia, unspecified organism: Secondary | ICD-10-CM

## 2022-12-19 NOTE — Patient Instructions (Signed)

## 2022-12-21 ENCOUNTER — Ambulatory Visit (INDEPENDENT_AMBULATORY_CARE_PROVIDER_SITE_OTHER): Payer: Medicare PPO | Admitting: Nurse Practitioner

## 2022-12-21 ENCOUNTER — Encounter: Payer: Self-pay | Admitting: Nurse Practitioner

## 2022-12-21 VITALS — BP 124/79 | HR 66 | Temp 97.8°F | Ht 72.01 in | Wt 185.2 lb

## 2022-12-21 DIAGNOSIS — J189 Pneumonia, unspecified organism: Secondary | ICD-10-CM | POA: Diagnosis not present

## 2022-12-21 NOTE — Assessment & Plan Note (Signed)
Acute and improved, imaging 12/17/22 showed resolution and he is overall feeling better.  No further acute concerns.

## 2022-12-21 NOTE — Progress Notes (Signed)
Contacted via MyChart   Good news [name], your imaging shows improved pneumonia!!!

## 2022-12-21 NOTE — Progress Notes (Signed)
BP 124/79   Pulse 66   Temp 97.8 F (36.6 C) (Oral)   Ht 6' 0.01" (1.829 m)   Wt 185 lb 3.2 oz (84 kg)   SpO2 99%   BMI 25.11 kg/m    Subjective:    Patient ID: [name], male    DOB: 11/20/1950, 72 y.o.   MRN: [id number]  HPI: [name] is a 72 y.o. male  Chief Complaint  Patient presents with   Pneumonia    Patient here for follow up   PNEUMONIA History of admission to hospital on 11/11/22 for pneumonia which was treated.  Repeat imaging on 12/17/22 showed no further opacities. He overall reports feeling better.  No further acute concerns.   Fever: no Cough: no Shortness of breath: no Wheezing: no Chest pain: no Chest tightness: no Chest congestion: no Nasal congestion: no Runny nose: no Post nasal drip: no Sneezing: no Sore throat: no Swollen glands: no Sinus pressure: no Headache: no Face pain: no Toothache: no Ear pain: none Ear pressure: none Eyes red/itching:no Eye drainage/crusting: no  Vomiting: no Rash: no Fatigue: no Sick contacts: no Strep contacts: no  Context: better  Relevant past medical, surgical, family and social history reviewed and updated as indicated. Interim medical history since our last visit reviewed. Allergies and medications reviewed and updated.  Review of Systems  Constitutional:  Negative for activity change, appetite change, chills, diaphoresis, fatigue and fever.  HENT: Negative.    Respiratory:  Negative for cough, chest tightness, shortness of breath and wheezing.   Cardiovascular:  Negative for chest pain, palpitations and leg swelling.  Gastrointestinal: Negative.   Neurological: Negative.   Psychiatric/Behavioral: Negative.      Per HPI unless specifically indicated above     Objective:    BP 124/79   Pulse 66   Temp 97.8 F (36.6 C) (Oral)   Ht 6' 0.01" (1.829 m)   Wt 185 lb 3.2 oz (84 kg)   SpO2 99%   BMI 25.11 kg/m   Wt Readings from Last 3 Encounters:  12/21/22 185 lb 3.2 oz (84  kg)  11/20/22 187 lb 3.2 oz (84.9 kg)  11/11/22 185 lb (83.9 kg)    Physical Exam Vitals and nursing note reviewed.  Constitutional:      General: He is awake. He is not in acute distress.    Appearance: He is well-developed and well-groomed. He is not ill-appearing or toxic-appearing.  HENT:     Head: Normocephalic and atraumatic.     Right Ear: Hearing, tympanic membrane, ear canal and external ear normal. No drainage.     Left Ear: Hearing, tympanic membrane, ear canal and external ear normal. No drainage.     Nose:     Right Sinus: No maxillary sinus tenderness or frontal sinus tenderness.     Left Sinus: No maxillary sinus tenderness or frontal sinus tenderness.     Mouth/Throat:     Pharynx: Uvula midline. No pharyngeal swelling, oropharyngeal exudate or posterior oropharyngeal erythema.  Eyes:     General: Lids are normal.        Right eye: No discharge.        Left eye: No discharge.     Conjunctiva/sclera: Conjunctivae normal.     Pupils: Pupils are equal, round, and reactive to light.  Neck:     Thyroid: No thyromegaly.     Vascular: No carotid bruit or JVD.     Trachea: Trachea normal.  Cardiovascular:  Rate and Rhythm: Normal rate and regular rhythm.     Heart sounds: Normal heart sounds, S1 normal and S2 normal. No murmur heard.    No gallop.  Pulmonary:     Effort: Pulmonary effort is normal. No accessory muscle usage or respiratory distress.     Breath sounds: Normal breath sounds.  Abdominal:     General: Bowel sounds are normal. There is no distension.     Palpations: Abdomen is soft.     Tenderness: There is no abdominal tenderness.  Musculoskeletal:        General: Normal range of motion.     Cervical back: Normal range of motion and neck supple.     Right lower leg: No edema.     Left lower leg: No edema.  Lymphadenopathy:     Head:     Right side of head: No submental, submandibular, tonsillar, preauricular or posterior auricular adenopathy.      Left side of head: No submental, submandibular, tonsillar, preauricular or posterior auricular adenopathy.     Cervical: No cervical adenopathy.  Skin:    General: Skin is warm and dry.     Capillary Refill: Capillary refill takes less than 2 seconds.     Findings: No rash.  Neurological:     Mental Status: He is alert and oriented to person, place, and time.     Gait: Gait is intact.     Deep Tendon Reflexes: Reflexes are normal and symmetric.     Reflex Scores:      Brachioradialis reflexes are 2+ on the right side and 2+ on the left side.      Patellar reflexes are 2+ on the right side and 2+ on the left side. Psychiatric:        Attention and Perception: Attention normal.        Mood and Affect: Mood normal.        Speech: Speech normal.        Behavior: Behavior normal. Behavior is cooperative.        Thought Content: Thought content normal.        Cognition and Memory: Cognition normal.    Results for orders placed or performed in visit on 11/20/22  CBC with Differential/Platelet  Result Value Ref Range   WBC 4.7 3.4 - 10.8 x10E3/uL   RBC 4.32 4.14 - 5.80 x10E6/uL   Hemoglobin 12.9 (L) 13.0 - 17.7 g/dL   Hematocrit 75.4 49.2 - 51.0 %   MCV 90 79 - 97 fL   MCH 29.9 26.6 - 33.0 pg   MCHC 33.1 31.5 - 35.7 g/dL   RDW 01.0 07.1 - 21.9 %   Platelets 242 150 - 450 x10E3/uL   Neutrophils 63 Not Estab. %   Lymphs 25 Not Estab. %   Monocytes 6 Not Estab. %   Eos 6 Not Estab. %   Basos 0 Not Estab. %   Neutrophils Absolute 2.9 1.4 - 7.0 x10E3/uL   Lymphocytes Absolute 1.2 0.7 - 3.1 x10E3/uL   Monocytes Absolute 0.3 0.1 - 0.9 x10E3/uL   EOS (ABSOLUTE) 0.3 0.0 - 0.4 x10E3/uL   Basophils Absolute 0.0 0.0 - 0.2 x10E3/uL   Immature Granulocytes 0 Not Estab. %   Immature Grans (Abs) 0.0 0.0 - 0.1 x10E3/uL  Comprehensive metabolic panel  Result Value Ref Range   Glucose 152 (H) 70 - 99 mg/dL   BUN 12 8 - 27 mg/dL   Creatinine, Ser 7.58 (H) 0.76 - 1.27 mg/dL  eGFR 52 (L) >59  mL/min/1.73   BUN/Creatinine Ratio 8 (L) 10 - 24   Sodium 137 134 - 144 mmol/L   Potassium 5.1 3.5 - 5.2 mmol/L   Chloride 102 96 - 106 mmol/L   CO2 22 20 - 29 mmol/L   Calcium 9.1 8.6 - 10.2 mg/dL   Total Protein 6.0 6.0 - 8.5 g/dL   Albumin 4.2 3.8 - 4.8 g/dL   Globulin, Total 1.8 1.5 - 4.5 g/dL   Albumin/Globulin Ratio 2.3 (H) 1.2 - 2.2   Bilirubin Total 0.3 0.0 - 1.2 mg/dL   Alkaline Phosphatase 75 44 - 121 IU/L   AST 21 0 - 40 IU/L   ALT 17 0 - 44 IU/L      Assessment & Plan:   Problem List Items Addressed This Visit       Respiratory   CAP (community acquired pneumonia) - Primary    Acute and improved, imaging 12/17/22 showed resolution and he is overall feeling better.  No further acute concerns.        Follow up plan: Return for as scheduled May 17th.

## 2022-12-29 ENCOUNTER — Other Ambulatory Visit: Payer: Self-pay | Admitting: Nurse Practitioner

## 2022-12-30 NOTE — Telephone Encounter (Signed)
Requested medication (s) are due for refill today: yes  Requested medication (s) are on the active medication list: yes  Last refill:  12/11/22 #5 ml 12 refills  Future visit scheduled: yes in 1 month  Notes to clinic:  not delegated per protocol . Do you want to refill Rx?     Requested Prescriptions  Pending Prescriptions Disp Refills   neomycin-polymyxin b-dexamethasone (MAXITROL) 3.5-10000-0.1 SUSP [Pharmacy Med Name: NEOMYCIN/POLYMYXIN/DEXAMETHASONE 3.5-10000-0.1  Suspension] 5 mL 12    Sig: PLACE 1 DROP INTO THE LEFT EYE 4 (FOUR) TIMES DAILY.     Not Delegated - Ophthalmology:  Corticosteroid & Antimicrobial Combinations Failed - 12/29/2022  3:22 PM      Failed - This refill cannot be delegated      Passed - Valid encounter within last 12 months    Recent Outpatient Visits           1 week ago Community acquired pneumonia of right lower lobe of lung   [facility] [facility] [place], [place] T, NP   1 month ago AKI (acute kidney injury) (HCC)   [place] [facility] [place], Corrie Dandy T, NP   2 months ago Sinus congestion   [facility] [place], [place] T, NP   5 months ago Type 2 diabetes mellitus with proteinuria (HCC)   [facility] [place], [place] T, NP   7 months ago Type 2 diabetes mellitus with hyperglycemia, with long-term current use of insulin (HCC)   [place] [facility] [place], Dorie Rank, NP       Future Appointments             In 1 month Cannady, Dorie Rank, NP  [facility], [name]

## 2023-01-08 ENCOUNTER — Ambulatory Visit: Payer: Self-pay | Admitting: *Deleted

## 2023-01-08 NOTE — Patient Outreach (Signed)
  Care Coordination   Follow Up Visit Note   01/08/2023 Name: [name] MRN: [id number] DOB: [birth year]  [name] is a 72 y.o. year old male who sees Cannady, Corrie Dandy T, NP for primary care. I spoke with  [name] by phone today.  What matters to the patients health and wellness today?  Patient has recovered from pneumonia, still working to improve DM management.  Denies any urgent concerns, encouraged to contact this care manager with questions.     Goals Addressed             This Visit's Progress    COMPLETED: Effective management of DM   On track    Care Coordination Interventions: Provided education to patient about basic DM disease process Reviewed medications with patient and discussed importance of medication adherence Counseled on importance of regular laboratory monitoring as prescribed Discussed plans with patient for ongoing care management follow up and provided patient with direct contact information for care management team Provided patient with written educational materials related to hypo and hyperglycemia and importance of correct treatment Reviewed scheduled/upcoming provider appointments including: endocrine rescheduled for 5/2 Advised patient, providing education and rationale, to check cbg daily and record, calling provider for findings outside established parameters Reviewed current A1C of 10, goal less than 7, still working to improve          SDOH assessments and interventions completed:  No     Care Coordination Interventions:  Yes, provided   Interventions Today    Flowsheet Row Most Recent Value  Chronic Disease   Chronic disease during today's visit Diabetes  General Interventions   General Interventions Discussed/Reviewed General Interventions Reviewed, Labs, Annual Eye Exam, Annual Foot Exam, Doctor Visits  Labs Hgb A1c every 3 months  Doctor Visits Discussed/Reviewed Doctor Visits Reviewed, Specialist   [Endocrine on 5/2]  PCP/Specialist Visits Compliance with follow-up visit  Education Interventions   Education Provided Provided Education  Provided Verbal Education On Blood Sugar Monitoring, Labs, When to see the doctor  Labs Reviewed Hgb A1c        Follow up plan: No further intervention required. No longer eligible for [facility] services.  Encounter Outcome:  Pt. Visit Completed   Kemper Durie, RN, MSN, [facility] Care Management Care Management Coordinator [phone]

## 2023-01-21 DIAGNOSIS — E785 Hyperlipidemia, unspecified: Secondary | ICD-10-CM | POA: Diagnosis not present

## 2023-01-21 DIAGNOSIS — E1169 Type 2 diabetes mellitus with other specified complication: Secondary | ICD-10-CM | POA: Diagnosis not present

## 2023-01-21 DIAGNOSIS — E1159 Type 2 diabetes mellitus with other circulatory complications: Secondary | ICD-10-CM | POA: Diagnosis not present

## 2023-01-21 DIAGNOSIS — I152 Hypertension secondary to endocrine disorders: Secondary | ICD-10-CM | POA: Diagnosis not present

## 2023-01-21 DIAGNOSIS — Z794 Long term (current) use of insulin: Secondary | ICD-10-CM | POA: Diagnosis not present

## 2023-01-21 DIAGNOSIS — E1165 Type 2 diabetes mellitus with hyperglycemia: Secondary | ICD-10-CM | POA: Diagnosis not present

## 2023-01-21 LAB — HM DIABETES EYE EXAM

## 2023-01-29 ENCOUNTER — Ambulatory Visit: Payer: Medicare HMO | Admitting: Nurse Practitioner

## 2023-01-29 DIAGNOSIS — E1165 Type 2 diabetes mellitus with hyperglycemia: Secondary | ICD-10-CM

## 2023-01-29 DIAGNOSIS — E781 Pure hyperglyceridemia: Secondary | ICD-10-CM

## 2023-01-29 DIAGNOSIS — I152 Hypertension secondary to endocrine disorders: Secondary | ICD-10-CM

## 2023-01-29 DIAGNOSIS — E1129 Type 2 diabetes mellitus with other diabetic kidney complication: Secondary | ICD-10-CM

## 2023-01-29 DIAGNOSIS — I2510 Atherosclerotic heart disease of native coronary artery without angina pectoris: Secondary | ICD-10-CM

## 2023-01-29 DIAGNOSIS — F5101 Primary insomnia: Secondary | ICD-10-CM

## 2023-01-29 DIAGNOSIS — N183 Chronic kidney disease, stage 3 unspecified: Secondary | ICD-10-CM

## 2023-01-29 DIAGNOSIS — E1169 Type 2 diabetes mellitus with other specified complication: Secondary | ICD-10-CM

## 2023-02-03 DIAGNOSIS — N201 Calculus of ureter: Secondary | ICD-10-CM | POA: Diagnosis not present

## 2023-02-03 DIAGNOSIS — N179 Acute kidney failure, unspecified: Secondary | ICD-10-CM | POA: Diagnosis not present

## 2023-02-03 DIAGNOSIS — I10 Essential (primary) hypertension: Secondary | ICD-10-CM | POA: Diagnosis not present

## 2023-02-03 DIAGNOSIS — N132 Hydronephrosis with renal and ureteral calculous obstruction: Secondary | ICD-10-CM | POA: Diagnosis not present

## 2023-02-03 DIAGNOSIS — Z880 Allergy status to penicillin: Secondary | ICD-10-CM | POA: Diagnosis not present

## 2023-02-03 DIAGNOSIS — K5730 Diverticulosis of large intestine without perforation or abscess without bleeding: Secondary | ICD-10-CM | POA: Diagnosis not present

## 2023-02-03 DIAGNOSIS — E875 Hyperkalemia: Secondary | ICD-10-CM | POA: Diagnosis not present

## 2023-02-03 DIAGNOSIS — Z7984 Long term (current) use of oral hypoglycemic drugs: Secondary | ICD-10-CM | POA: Diagnosis not present

## 2023-02-03 DIAGNOSIS — E119 Type 2 diabetes mellitus without complications: Secondary | ICD-10-CM | POA: Diagnosis not present

## 2023-02-03 DIAGNOSIS — Z7982 Long term (current) use of aspirin: Secondary | ICD-10-CM | POA: Diagnosis not present

## 2023-02-03 DIAGNOSIS — Z7902 Long term (current) use of antithrombotics/antiplatelets: Secondary | ICD-10-CM | POA: Diagnosis not present

## 2023-02-05 ENCOUNTER — Ambulatory Visit (INDEPENDENT_AMBULATORY_CARE_PROVIDER_SITE_OTHER): Payer: Medicare PPO | Admitting: Physician Assistant

## 2023-02-05 ENCOUNTER — Encounter: Payer: Self-pay | Admitting: Physician Assistant

## 2023-02-05 VITALS — BP 123/70 | HR 54 | Temp 98.3°F | Wt 190.0 lb

## 2023-02-05 DIAGNOSIS — N200 Calculus of kidney: Secondary | ICD-10-CM | POA: Diagnosis not present

## 2023-02-05 DIAGNOSIS — N179 Acute kidney failure, unspecified: Secondary | ICD-10-CM

## 2023-02-05 DIAGNOSIS — E875 Hyperkalemia: Secondary | ICD-10-CM

## 2023-02-05 NOTE — Progress Notes (Signed)
Acute Office Visit   Patient: [name]   DOB: 1951/07/25   72 y.o. Male  MRN: [id number] Visit Date: 02/05/2023  Today's healthcare provider: Oswaldo Conroy Kadedra Vanaken, PA-C  Introduced myself to the patient as a Secondary school teacher and provided education on APPs in clinical practice.    Chief Complaint  Patient presents with   Nephrolithiasis    Pt states he has seen at the ER Wednesday evening for a kidney stone. States he was put on Flomax. States he passed the stone yesterday morning and is feeling better today.    Subjective    HPI HPI     Nephrolithiasis    Additional comments: Pt states he has seen at the ER Wednesday evening for a kidney stone. States he was put on Flomax. States he passed the stone yesterday morning and is feeling better today.       Last edited by Pablo Ledger, CMA on 02/05/2023 10:05 AM.       Patient was seen in the ED on Wed for kidney stone He reports he passed one stone yesterday morning His CT scan demonstrated one 0.5 cm stone at left ureterovesical junction and one 0.5 cm stone in left kidney that is nonobstructive He reports having a hx of kidney stones about 6 years ago but has not seen Urology since then   He reports he was instructed to stop taking his Lisinopril at the ED but he states he does not take this anymore and hasn't taken it for some time  He has been taking his Flomax since last night   Medications: Outpatient Medications Prior to Visit  Medication Sig   ACCU-CHEK GUIDE test strip 1 each by Other route 2 times daily at 12 noon and 4 pm.   amitriptyline (ELAVIL) 10 MG tablet Take 4 tablets (40 mg total) by mouth at bedtime.   aspirin EC 81 MG tablet Take 81 mg by mouth daily.    atenolol (TENORMIN) 50 MG tablet Take 1 tablet (50 mg total) by mouth daily.   clopidogrel (PLAVIX) 75 MG tablet Take 1 tablet (75 mg total) by mouth daily.   fenofibrate (TRICOR) 145 MG tablet Take 1 tablet (145 mg total) by mouth daily.   glipiZIDE  (GLUCOTROL) 10 MG tablet Take 10 mg by mouth daily before breakfast.   insulin degludec (TRESIBA FLEXTOUCH) 100 UNIT/ML FlexTouch Pen Inject 80 Units into the skin at bedtime.   metFORMIN (GLUCOPHAGE) 500 MG tablet TAKE 2 TABLETS TWICE DAILY WITH MEALS   neomycin-polymyxin b-dexamethasone (MAXITROL) 3.5-10000-0.1 SUSP PLACE 1 DROP INTO THE LEFT EYE 4 (FOUR) TIMES DAILY.   nitroGLYCERIN (NITROSTAT) 0.4 MG SL tablet Place 0.4 mg under the tongue every 5 (five) minutes as needed.   omega-3 acid ethyl esters (LOVAZA) 1 g capsule Take 2 capsules (2 g total) by mouth 2 (two) times daily.   pantoprazole (PROTONIX) 20 MG tablet TAKE 1 TABLET EVERY DAY (DISCONTINUE 40MG  DOSE)   ramipril (ALTACE) 1.25 MG capsule Take 1 capsule (1.25 mg total) by mouth daily.   rosuvastatin (CRESTOR) 40 MG tablet Take 1 tablet (40 mg total) by mouth daily.   tamsulosin (FLOMAX) 0.4 MG CAPS capsule Take 0.4 mg by mouth daily.   vitamin B-12 (CYANOCOBALAMIN) 1000 MCG tablet Take 1,000 mcg by mouth daily.   [DISCONTINUED] glipiZIDE (GLUCOTROL XL) 2.5 MG 24 hr tablet Take 10 mg by mouth daily with breakfast.   No facility-administered medications prior to visit.  Review of Systems  Genitourinary:  Negative for difficulty urinating, dysuria, flank pain and frequency.       Objective    BP 123/70   Pulse (!) 54   Temp 98.3 F (36.8 C) (Oral)   Wt 190 lb (86.2 kg)   SpO2 97%   BMI 25.76 kg/m    Physical Exam Vitals reviewed.  Constitutional:      Appearance: Normal appearance.  HENT:     Head: Normocephalic and atraumatic.  Eyes:     Extraocular Movements: Extraocular movements intact.     Conjunctiva/sclera: Conjunctivae normal.  Pulmonary:     Effort: Pulmonary effort is normal.  Musculoskeletal:     Cervical back: Normal range of motion.  Neurological:     General: No focal deficit present.     Mental Status: He is alert and oriented to person, place, and time.  Psychiatric:        Mood and  Affect: Mood normal.        Behavior: Behavior normal.        Thought Content: Thought content normal.        Judgment: Judgment normal.       No results found for any visits on 02/05/23.  Assessment & Plan      No follow-ups on file.      Problem List Items Addressed This Visit   None Visit Diagnoses     Left nephrolithiasis    -  Primary Acute, recurrent I have reviewed his ED visit note along with imaging and lab results from 02/03/23  Patient was previously seen at the emergency room on Wednesday evening for kidney stone. CT abdomen pelvis was positive for 5 mm stone at left ureterovesicular junction resulting in moderate left hydroureteronephrosis along with 5 mm nonobstructing left renal stone. No right hydronephrosis He reports he has been straining his urine and passed a stone yesterday which resulted in resolution of pain  He denies urinary hesitancy or enuresis, fevers, flank pain, hematuria today  He is still taking his Flomax  Will provide referral to Urology as there was a second 5 mm stone in left kidney that may still be present Reviewed ED and return precautions with patient- he voiced understanding and agreement Follow up as needed for persistent or progressing symptoms     Relevant Orders   Ambulatory referral to Urology   Acute hyperkalemia     Acute, new concern Suspect this is likely secondary to nephrolithiasis with obstruction  At ED visit on 02/03/23 his potassium was 5.2 without EKG changes Will recheck CMP today for monitoring  Recommend he stay well hydrated. Results to dictate further management Follow up as needed for persistent or progressing symptoms     Relevant Orders   Comp Met (CMET)   AKI (acute kidney injury) (HCC)     Acute, new concern Patient was seen in ED on 02/03/23 for left kidney stone and eGFR was found to be 41. Previous GFR from 11/20/22 was 52 and prior to that his last labs have not been above 60 going back to 04/17/22 Unsure  if this is acute vs worsening of CKD  Will recheck with CMP today for trending  Results to dictate further management Recommend follow up with PCP after he is recovered from most recent complaint to ensure return to baseline     Relevant Orders   CBC w/Diff   Comp Met (CMET)        No follow-ups on file.  I, Reana Chacko E Cylas Falzone, PA-C, have reviewed all documentation for this visit. The documentation on 02/05/23 for the exam, diagnosis, procedures, and orders are all accurate and complete.   Jacquelin Hawking, MHS, PA-C [facility]

## 2023-02-06 LAB — CBC WITH DIFFERENTIAL/PLATELET
Basophils Absolute: 0 10*3/uL (ref 0.0–0.2)
Basos: 0 %
EOS (ABSOLUTE): 0.2 10*3/uL (ref 0.0–0.4)
Eos: 5 %
Hematocrit: 41.9 % (ref 37.5–51.0)
Hemoglobin: 13.8 g/dL (ref 13.0–17.7)
Immature Grans (Abs): 0 10*3/uL (ref 0.0–0.1)
Immature Granulocytes: 0 %
Lymphocytes Absolute: 1.3 10*3/uL (ref 0.7–3.1)
Lymphs: 27 %
MCH: 29.1 pg (ref 26.6–33.0)
MCHC: 32.9 g/dL (ref 31.5–35.7)
MCV: 88 fL (ref 79–97)
Monocytes Absolute: 0.3 10*3/uL (ref 0.1–0.9)
Monocytes: 7 %
Neutrophils Absolute: 2.9 10*3/uL (ref 1.4–7.0)
Neutrophils: 61 %
Platelets: 186 10*3/uL (ref 150–450)
RBC: 4.74 x10E6/uL (ref 4.14–5.80)
RDW: 13.8 % (ref 11.6–15.4)
WBC: 4.8 10*3/uL (ref 3.4–10.8)

## 2023-02-06 LAB — COMPREHENSIVE METABOLIC PANEL
ALT: 11 IU/L (ref 0–44)
AST: 12 IU/L (ref 0–40)
Albumin/Globulin Ratio: 1.9 (ref 1.2–2.2)
Albumin: 4 g/dL (ref 3.8–4.8)
Alkaline Phosphatase: 60 IU/L (ref 44–121)
BUN/Creatinine Ratio: 13 (ref 10–24)
BUN: 22 mg/dL (ref 8–27)
Bilirubin Total: 0.3 mg/dL (ref 0.0–1.2)
CO2: 21 mmol/L (ref 20–29)
Calcium: 9.6 mg/dL (ref 8.6–10.2)
Chloride: 104 mmol/L (ref 96–106)
Creatinine, Ser: 1.72 mg/dL — ABNORMAL HIGH (ref 0.76–1.27)
Globulin, Total: 2.1 g/dL (ref 1.5–4.5)
Glucose: 161 mg/dL — ABNORMAL HIGH (ref 70–99)
Potassium: 5.2 mmol/L (ref 3.5–5.2)
Sodium: 138 mmol/L (ref 134–144)
Total Protein: 6.1 g/dL (ref 6.0–8.5)
eGFR: 41 mL/min/{1.73_m2} — ABNORMAL LOW (ref >59)

## 2023-02-09 ENCOUNTER — Encounter: Payer: Self-pay | Admitting: Nurse Practitioner

## 2023-02-09 NOTE — Progress Notes (Signed)
Your lab results are back.  Your blood count was in normal range.  Your potassium is still a little high so I recommend making sure you are staying well-hydrated with plenty of water and avoiding potassium in your daily food intake is much as possible.  Your kidney function is still a bit low but appears to be around its normal levels based on comparison to previous results.  I recommend rechecking your potassium at your next appointment with your PCP.

## 2023-02-18 ENCOUNTER — Ambulatory Visit: Payer: Self-pay

## 2023-02-18 MED ORDER — VALACYCLOVIR HCL 1 G PO TABS: 1000.0000 mg | ORAL_TABLET | Freq: Two times a day (BID) | ORAL | 0 refills | Status: AC

## 2023-02-18 NOTE — Telephone Encounter (Signed)
3rd attempt, Patient called, left VM to return the call to the office to discuss symptoms with a nurse.       Summary: Medication for fever blister    Patient states that he had a fever blister pop up yesterday and would like medication to treat it.

## 2023-02-18 NOTE — Telephone Encounter (Signed)
Patient states that he had a fever blister pop up yesterday and would like medication to treat it.   Left message to call back.

## 2023-02-18 NOTE — Addendum Note (Signed)
Addended by: Aura Dials T on: 02/18/2023 01:52 PM   Modules accepted: Orders

## 2023-02-18 NOTE — Telephone Encounter (Signed)
Patient called, left VM to return the call to the office to discuss symptoms with a nurse.   Summary: Medication for fever blister   Patient states that he had a fever blister pop up yesterday and would like medication to treat it.

## 2023-03-05 ENCOUNTER — Ambulatory Visit: Payer: Medicare PPO | Admitting: Nurse Practitioner

## 2023-03-05 DIAGNOSIS — I152 Hypertension secondary to endocrine disorders: Secondary | ICD-10-CM

## 2023-03-05 DIAGNOSIS — E1165 Type 2 diabetes mellitus with hyperglycemia: Secondary | ICD-10-CM

## 2023-03-05 DIAGNOSIS — E1169 Type 2 diabetes mellitus with other specified complication: Secondary | ICD-10-CM

## 2023-03-05 DIAGNOSIS — F5101 Primary insomnia: Secondary | ICD-10-CM

## 2023-03-05 DIAGNOSIS — E1122 Type 2 diabetes mellitus with diabetic chronic kidney disease: Secondary | ICD-10-CM

## 2023-03-05 DIAGNOSIS — E781 Pure hyperglyceridemia: Secondary | ICD-10-CM

## 2023-03-05 DIAGNOSIS — E1129 Type 2 diabetes mellitus with other diabetic kidney complication: Secondary | ICD-10-CM

## 2023-03-12 ENCOUNTER — Ambulatory Visit: Payer: Medicare PPO | Admitting: Urology

## 2023-03-28 NOTE — Patient Instructions (Signed)
Diabetes Mellitus Basics  Diabetes mellitus, or diabetes, is a long-term (chronic) disease. It occurs when the body does not properly use sugar (glucose) that is released from food after you eat. Diabetes mellitus may be caused by one or both of these problems: Your pancreas does not make enough of a hormone called insulin. Your body does not react in a normal way to the insulin that it makes. Insulin lets glucose enter cells in your body. This gives you energy. If you have diabetes, glucose cannot get into cells. This causes high blood glucose (hyperglycemia). How to treat and manage diabetes You may need to take insulin or other diabetes medicines daily to keep your glucose in balance. If you are prescribed insulin, you will learn how to give yourself insulin by injection. You may need to adjust the amount of insulin you take based on the foods that you eat. You will need to check your blood glucose levels using a glucose monitor as told by your health care provider. The readings can help determine if you have low or high blood glucose. Generally, you should have these blood glucose levels: Before meals (preprandial): 80-130 mg/dL (4.4-7.2 mmol/L). After meals (postprandial): below 180 mg/dL (10 mmol/L). Hemoglobin A1c (HbA1c) level: less than 7%. Your health care provider will set treatment goals for you. Keep all follow-up visits. This is important. Follow these instructions at home: Diabetes medicines Take your diabetes medicines every day as told by your health care provider. List your diabetes medicines here: Name of medicine: ______________________________ Amount (dose): _______________ Time (a.m./p.m.): _______________ Notes: ___________________________________ Name of medicine: ______________________________ Amount (dose): _______________ Time (a.m./p.m.): _______________ Notes: ___________________________________ Name of medicine: ______________________________ Amount (dose):  _______________ Time (a.m./p.m.): _______________ Notes: ___________________________________ Insulin If you use insulin, list the types of insulin you use here: Insulin type: ______________________________ Amount (dose): _______________ Time (a.m./p.m.): _______________Notes: ___________________________________ Insulin type: ______________________________ Amount (dose): _______________ Time (a.m./p.m.): _______________ Notes: ___________________________________ Insulin type: ______________________________ Amount (dose): _______________ Time (a.m./p.m.): _______________ Notes: ___________________________________ Insulin type: ______________________________ Amount (dose): _______________ Time (a.m./p.m.): _______________ Notes: ___________________________________ Insulin type: ______________________________ Amount (dose): _______________ Time (a.m./p.m.): _______________ Notes: ___________________________________ Managing blood glucose  Check your blood glucose levels using a glucose monitor as told by your health care provider. Write down the times that you check your glucose levels here: Time: _______________ Notes: ___________________________________ Time: _______________ Notes: ___________________________________ Time: _______________ Notes: ___________________________________ Time: _______________ Notes: ___________________________________ Time: _______________ Notes: ___________________________________ Time: _______________ Notes: ___________________________________  Low blood glucose Low blood glucose (hypoglycemia) is when glucose is at or below 70 mg/dL (3.9 mmol/L). Symptoms may include: Feeling: Hungry. Sweaty and clammy. Irritable or easily upset. Dizzy. Sleepy. Having: A fast heartbeat. A headache. A change in your vision. Numbness around the mouth, lips, or tongue. Having trouble with: Moving (coordination). Sleeping. Treating low blood glucose To treat low blood  glucose, eat or drink something containing sugar right away. If you can think clearly and swallow safely, follow the 15:15 rule: Take 15 grams of a fast-acting carb (carbohydrate), as told by your health care provider. Some fast-acting carbs are: Glucose tablets: take 3-4 tablets. Hard candy: eat 3-5 pieces. Fruit juice: drink 4 oz (120 mL). Regular (not diet) soda: drink 4-6 oz (120-180 mL). Honey or sugar: eat 1 Tbsp (15 mL). Check your blood glucose levels 15 minutes after you take the carb. If your glucose is still at or below 70 mg/dL (3.9 mmol/L), take 15 grams of a carb again. If your glucose does not go above 70 mg/dL (3.9 mmol/L) after   3 tries, get help right away. After your glucose goes back to normal, eat a meal or a snack within 1 hour. Treating very low blood glucose If your glucose is at or below 54 mg/dL (3 mmol/L), you have very low blood glucose (severe hypoglycemia). This is an emergency. Do not wait to see if the symptoms will go away. Get medical help right away. Call your local emergency services (911 in the [place] Do not drive yourself to the hospital. Questions to ask your health care provider Should I talk with a diabetes educator? What equipment will I need to care for myself at home? What diabetes medicines do I need? When should I take them? How often do I need to check my blood glucose levels? What number can I call if I have questions? When is my follow-up visit? Where can I find a support group for people with diabetes? Where to find more information American Diabetes Association: www.diabetes.org Association of Diabetes Care and Education Specialists: www.diabeteseducator.org Contact a health care provider if: Your blood glucose is at or above 240 mg/dL (13.3 mmol/L) for 2 days in a row. You have been sick or have had a fever for 2 days or more, and you are not getting better. You have any of these problems for more than 6 hours: You cannot eat or  drink. You feel nauseous. You vomit. You have diarrhea. Get help right away if: Your blood glucose is lower than 54 mg/dL (3 mmol/L). You get confused. You have trouble thinking clearly. You have trouble breathing. These symptoms may represent a serious problem that is an emergency. Do not wait to see if the symptoms will go away. Get medical help right away. Call your local emergency services (911 in the [place] Do not drive yourself to the hospital. Summary Diabetes mellitus is a chronic disease that occurs when the body does not properly use sugar (glucose) that is released from food after you eat. Take insulin and diabetes medicines as told. Check your blood glucose every day, as often as told. Keep all follow-up visits. This is important. This information is not intended to replace advice given to you by your health care provider. Make sure you discuss any questions you have with your health care provider. Document Revised: 01/02/2020 Document Reviewed: 01/02/2020 Elsevier Patient Education  2024 [employer] Inc.  

## 2023-04-02 ENCOUNTER — Ambulatory Visit (INDEPENDENT_AMBULATORY_CARE_PROVIDER_SITE_OTHER): Payer: Medicare PPO | Admitting: Nurse Practitioner

## 2023-04-02 ENCOUNTER — Encounter: Payer: Self-pay | Admitting: Nurse Practitioner

## 2023-04-02 ENCOUNTER — Ambulatory Visit
Admission: RE | Admit: 2023-04-02 | Discharge: 2023-04-02 | Disposition: A | Discharge status: Home / Self Care | Payer: Medicare PPO | Attending: Nurse Practitioner | Admitting: Nurse Practitioner

## 2023-04-02 ENCOUNTER — Ambulatory Visit
Admission: RE | Admit: 2023-04-02 | Discharge: 2023-04-02 | Disposition: A | Discharge status: Home / Self Care | Payer: Medicare PPO | Source: Ambulatory Visit | Attending: Nurse Practitioner | Admitting: Nurse Practitioner

## 2023-04-02 VITALS — BP 134/70 | HR 60 | Temp 98.2°F | Ht 72.01 in | Wt 182.2 lb

## 2023-04-02 DIAGNOSIS — E781 Pure hyperglyceridemia: Secondary | ICD-10-CM

## 2023-04-02 DIAGNOSIS — U071 COVID-19: Secondary | ICD-10-CM | POA: Diagnosis not present

## 2023-04-02 DIAGNOSIS — E1169 Type 2 diabetes mellitus with other specified complication: Secondary | ICD-10-CM

## 2023-04-02 DIAGNOSIS — E1159 Type 2 diabetes mellitus with other circulatory complications: Secondary | ICD-10-CM | POA: Diagnosis not present

## 2023-04-02 DIAGNOSIS — E1122 Type 2 diabetes mellitus with diabetic chronic kidney disease: Secondary | ICD-10-CM

## 2023-04-02 DIAGNOSIS — E1129 Type 2 diabetes mellitus with other diabetic kidney complication: Secondary | ICD-10-CM | POA: Diagnosis not present

## 2023-04-02 DIAGNOSIS — F5101 Primary insomnia: Secondary | ICD-10-CM | POA: Diagnosis not present

## 2023-04-02 DIAGNOSIS — I152 Hypertension secondary to endocrine disorders: Secondary | ICD-10-CM | POA: Diagnosis not present

## 2023-04-02 DIAGNOSIS — N183 Chronic kidney disease, stage 3 unspecified: Secondary | ICD-10-CM | POA: Diagnosis not present

## 2023-04-02 DIAGNOSIS — E1165 Type 2 diabetes mellitus with hyperglycemia: Secondary | ICD-10-CM

## 2023-04-02 DIAGNOSIS — E785 Hyperlipidemia, unspecified: Secondary | ICD-10-CM | POA: Diagnosis not present

## 2023-04-02 DIAGNOSIS — Z794 Long term (current) use of insulin: Secondary | ICD-10-CM

## 2023-04-02 DIAGNOSIS — R059 Cough, unspecified: Secondary | ICD-10-CM | POA: Diagnosis not present

## 2023-04-02 DIAGNOSIS — R809 Proteinuria, unspecified: Secondary | ICD-10-CM | POA: Diagnosis not present

## 2023-04-02 DIAGNOSIS — K219 Gastro-esophageal reflux disease without esophagitis: Secondary | ICD-10-CM

## 2023-04-02 LAB — MICROALBUMIN, URINE WAIVED
Creatinine, Urine Waived: 300 mg/dL (ref 10–300)
Microalb, Ur Waived: 150 mg/L — ABNORMAL HIGH (ref 0–19)

## 2023-04-02 LAB — BAYER DCA HB A1C WAIVED: HB A1C (BAYER DCA - WAIVED): 8.8 % — ABNORMAL HIGH (ref 4.8–5.6)

## 2023-04-02 MED ORDER — HYDROCOD POLI-CHLORPHE POLI ER 10-8 MG/5ML PO SUER: 5.0000 mL | Freq: Every evening | ORAL | 0 refills | Status: DC | PRN

## 2023-04-02 MED ORDER — AZITHROMYCIN 250 MG PO TABS: ORAL_TABLET | ORAL | 0 refills | Status: AC

## 2023-04-02 MED ORDER — ALBUTEROL SULFATE HFA 108 (90 BASE) MCG/ACT IN AERS: 2.0000 | INHALATION_SPRAY | Freq: Four times a day (QID) | RESPIRATORY_TRACT | 0 refills | Status: AC | PRN

## 2023-04-02 NOTE — Assessment & Plan Note (Signed)
Chronic, stable.  BP at goal on recheck today for age.  Will continue current medication regimen and cardiology collaboration -- he plans to schedule follow-up with them.  Recommend he monitor BP at home at least a few mornings a week and document for provider + focus on DASH diet.  Could consider addition of Amlodipine if elevation in SBP noted.  Urine ALB 150 (July 2024), continue Ramipril for kidney protection.  LABS: CMP.

## 2023-04-02 NOTE — Assessment & Plan Note (Addendum)
Chronic, ongoing CKD 3a to 3 b on checks.  Followed by endocrinology with A1c 8.8% today (trend down) and urine ALB 150 (July 2024) -- continue Ramipril for kidney protection.  Continue current medication regimen as prescribed by endocrinology, notes reviewed.  CCM collaboration continues -- may need help with cost of medications in future. Recommend he check BS at least twice a day, fasting and 2 hours after a meal.  Plan on 6 month follow-up as sees endocrinology.  Recent notes and changes reviewed. - Foot and eye exams up to date - Vaccinations up to date - ACE and Statin on board.

## 2023-04-02 NOTE — Addendum Note (Signed)
Addended by: Leward Quan A on: 04/02/2023 02:26 PM   Modules accepted: Orders

## 2023-04-02 NOTE — Assessment & Plan Note (Signed)
Lows on labs and taking supplement.  Recheck today and adjust as needed.  Recommend cut back on Protonix use.

## 2023-04-02 NOTE — Addendum Note (Signed)
Addended by: Leward Quan A on: 04/02/2023 12:28 PM   Modules accepted: Orders

## 2023-04-02 NOTE — Assessment & Plan Note (Addendum)
Chronic, ongoing.  Followed by endocrinology with A1c 8.8% today (trend down) and urine ALB 150 (July 2024) -- continue Ramipril for kidney protection.  Continue current medication regimen as prescribed by endocrinology, notes reviewed.  CCM collaboration continues -- may need help with cost of medications in future. Recommend he check BS at least twice a day, fasting and 2 hours after a meal.  Plan on 6 month follow-up as sees endocrinology.  Recent notes and changes reviewed. - Foot and eye exams up to date - Vaccinations up to date - ACE and Statin on board.

## 2023-04-02 NOTE — Progress Notes (Addendum)
BP 134/70 (BP Location: Left Arm, Patient Position: Sitting, Cuff Size: Normal)   Pulse 60   Temp 98.2 F (36.8 C) (Oral)   Ht 6' 0.01" (1.829 m)   Wt 182 lb 3.2 oz (82.6 kg)   SpO2 90%   BMI 24.71 kg/m    Subjective:    Patient ID: [name], male    DOB: [birth year], 72 y.o.   MRN: [id number]  HPI: [name] is a 72 y.o. male  Chief Complaint  Patient presents with   Cough   Covid positvie    On Wednesday last week 03/24/23.   DIABETES Followed by endocrinology, Dr. Gershon Crane, last 01/21/23 with A1c 10%, Glipizide was increased to 10 MG daily.  Currently using Tresiba, Metformin, Glipizide.  Was diagnosed >10 years ago.  Tried Jardiance in past, but was too costly.  History of low B12 levels, is taking supplement daily. Hypoglycemic episodes:no Polydipsia/polyuria: no Visual disturbance: no Chest pain: no Paresthesias: no Glucose Monitoring: yes             Accucheck frequency: Daily             Fasting glucose: 70 to 80 range -- this morning 125             Post prandial:             Evening:             Before meals: Taking Insulin?: yes             Long acting insulin: Tresiba 75 units             Short acting insulin: Blood Pressure Monitoring: not checking Retinal Examination: Up To Date -- [facility] Foot Exam: Up to Date Pneumovax: Up to Date Influenza: Up to Date Aspirin: yes   CHRONIC KIDNEY DISEASE Recent kidney labs remain 3a to 3b range. CKD status: stable   Medications renally dose: yes Previous renal evaluation: no Pneumovax:  Up to Date Influenza Vaccine:  Up to Date    HYPERTENSION / HYPERLIPIDEMIA Taking Ramipril 1.25 MG, Atenolol 50 MG, ASA, Fenofibrate, Crestor, Lovaza, and Plavix.  Saw Dr. Lady Gary 03/18/21, to continue dual anti-platelet therapy -- to visit with them yearly and has missed visits, plans to schedule follow-up.    Quit smoking in 1998, smoked about 2 PPD.  History of CABG in 1998, stent in 2006, catheterization  in 2011.  Last echo in 2019 and EF 55% . No NTG use recently.  Low Magnesium levels past labs, taking supplement. Satisfied with current treatment? yes Duration of hypertension: chronic BP monitoring frequency: not checking BP range:  BP medication side effects: no Duration of hyperlipidemia: chronic Cholesterol medication side effects: no Cholesterol supplements: none Medication compliance: good compliance Aspirin: yes Recent stressors: no Recurrent headaches: no Visual changes: no Palpitations: no Dyspnea: occasionally with vigorous activity Chest pain: no Lower extremity edema: no Dizzy/lightheaded: no    INSOMNIA Continues Amitriptyline 40 MG at bedtime.  Has tried Trazodone, Ambien, Melatonin in past. Has never had a sleep study, does not snore at night. Duration: chronic Satisfied with sleep quality: no Difficulty falling asleep: no Difficulty staying asleep: no Waking a few hours after sleep onset: yes Early morning awakenings: no Daytime hypersomnolence: no Wakes feeling refreshed: yes Good sleep hygiene: yes Apnea: no Snoring: no Depressed/anxious mood: no Recent stress: no Restless legs/nocturnal leg cramps: no Chronic pain/arthritis: no History of sleep study: yes in past, was negative Treatments attempted:  ambien, Trazodone, Melatonin   COVID POSITIVE Diagnosed over one week ago, tested negative on recheck this week.  No treatment at time of diagnosis, reports he stayed away from everyone. Fever: no Cough: yes Shortness of breath: yes little bit Wheezing: yes Chest pain: no Chest tightness: no Chest congestion: no Nasal congestion: no Runny nose: no Post nasal drip: no Sneezing: no Sore throat: no Swollen glands: no Sinus pressure: no Headache: no Face pain: no Toothache: no Ear pain: none Ear pressure: none Eyes red/itching:no Eye drainage/crusting: no  Vomiting: no Rash: no Fatigue: yes Sick contacts: no Strep contacts: no  Context:  fluctuating Recurrent sinusitis: no Relief with OTC cold/cough medications: no  Treatments attempted: nothing    Relevant past medical, surgical, family and social history reviewed and updated as indicated. Interim medical history since our last visit reviewed. Allergies and medications reviewed and updated.  Review of Systems  Constitutional:  Negative for activity change, appetite change, chills, diaphoresis, fatigue and fever.  HENT: Negative.    Respiratory:  Positive for cough, shortness of breath and wheezing. Negative for chest tightness.   Cardiovascular:  Negative for chest pain, palpitations and leg swelling.  Gastrointestinal: Negative.   Neurological: Negative.   Psychiatric/Behavioral: Negative.      Per HPI unless specifically indicated above     Objective:    BP 134/70 (BP Location: Left Arm, Patient Position: Sitting, Cuff Size: Normal)   Pulse 60   Temp 98.2 F (36.8 C) (Oral)   Ht 6' 0.01" (1.829 m)   Wt 182 lb 3.2 oz (82.6 kg)   SpO2 90%   BMI 24.71 kg/m   Wt Readings from Last 3 Encounters:  04/02/23 182 lb 3.2 oz (82.6 kg)  02/05/23 190 lb (86.2 kg)  12/21/22 185 lb 3.2 oz (84 kg)    Physical Exam Vitals and nursing note reviewed.  Constitutional:      General: He is awake. He is not in acute distress.    Appearance: He is well-developed and well-groomed. He is not ill-appearing or toxic-appearing.  HENT:     Head: Normocephalic and atraumatic.     Right Ear: Hearing, tympanic membrane, ear canal and external ear normal. No drainage.     Left Ear: Hearing, tympanic membrane, ear canal and external ear normal. No drainage.     Nose:     Right Sinus: No maxillary sinus tenderness or frontal sinus tenderness.     Left Sinus: No maxillary sinus tenderness or frontal sinus tenderness.     Mouth/Throat:     Pharynx: Uvula midline. No pharyngeal swelling, oropharyngeal exudate or posterior oropharyngeal erythema.  Eyes:     General: Lids are normal.         Right eye: No discharge.        Left eye: No discharge.     Conjunctiva/sclera: Conjunctivae normal.     Pupils: Pupils are equal, round, and reactive to light.  Neck:     Thyroid: No thyromegaly.     Vascular: No carotid bruit or JVD.     Trachea: Trachea normal.  Cardiovascular:     Rate and Rhythm: Normal rate and regular rhythm.     Heart sounds: Normal heart sounds, S1 normal and S2 normal. No murmur heard.    No gallop.  Pulmonary:     Effort: Pulmonary effort is normal. No accessory muscle usage or respiratory distress.     Breath sounds: Wheezing present. No decreased breath sounds or rhonchi.  Comments: Occasional expiratory wheeze noted throughout. Abdominal:     General: Bowel sounds are normal. There is no distension.     Palpations: Abdomen is soft.     Tenderness: There is no abdominal tenderness.  Musculoskeletal:        General: Normal range of motion.     Cervical back: Normal range of motion and neck supple.     Right lower leg: No edema.     Left lower leg: No edema.  Lymphadenopathy:     Head:     Right side of head: No submental, submandibular, tonsillar, preauricular or posterior auricular adenopathy.     Left side of head: No submental, submandibular, tonsillar, preauricular or posterior auricular adenopathy.     Cervical: No cervical adenopathy.  Skin:    General: Skin is warm and dry.     Capillary Refill: Capillary refill takes less than 2 seconds.     Findings: No rash.  Neurological:     Mental Status: He is alert and oriented to person, place, and time.     Gait: Gait is intact.     Deep Tendon Reflexes: Reflexes are normal and symmetric.     Reflex Scores:      Brachioradialis reflexes are 2+ on the right side and 2+ on the left side.      Patellar reflexes are 2+ on the right side and 2+ on the left side. Psychiatric:        Attention and Perception: Attention normal.        Mood and Affect: Mood normal.        Speech: Speech normal.         Behavior: Behavior normal. Behavior is cooperative.        Thought Content: Thought content normal.        Cognition and Memory: Cognition normal.     Results for orders placed or performed in visit on 04/02/23  Microalbumin, Urine Waived  Result Value Ref Range   Microalb, Ur Waived 150 (H) 0 - 19 mg/L   Creatinine, Urine Waived 300 10 - 300 mg/dL   Microalb/Creat Ratio 30-300 (H) <30 mg/g  Bayer DCA Hb A1c Waived (STAT)  Result Value Ref Range   HB A1C (BAYER DCA - WAIVED) 8.8 (H) 4.8 - 5.6 %      Assessment & Plan:   Problem List Items Addressed This Visit       Cardiovascular and Mediastinum   Hypertension associated with type 2 diabetes mellitus (HCC)    Chronic, stable.  BP at goal on recheck today for age.  Will continue current medication regimen and cardiology collaboration -- he plans to schedule follow-up with them.  Recommend he monitor BP at home at least a few mornings a week and document for provider + focus on DASH diet.  Could consider addition of Amlodipine if elevation in SBP noted.  Urine ALB 150 (July 2024), continue Ramipril for kidney protection.  LABS: CMP.        Relevant Orders   Comprehensive metabolic panel   CBC with Differential/Platelet   Lipid panel   Bayer DCA Hb A1c Waived (STAT) (Completed)   Magnesium     Endocrine   CKD stage 3 due to type 2 diabetes mellitus (HCC)    Chronic, ongoing CKD 3a to 3 b on checks.  Followed by endocrinology with A1c 8.8% today (trend down) and urine ALB 150 (July 2024) -- continue Ramipril for kidney protection.  Continue current medication  regimen as prescribed by endocrinology, notes reviewed.  CCM collaboration continues -- may need help with cost of medications in future. Recommend he check BS at least twice a day, fasting and 2 hours after a meal.  Plan on 6 month follow-up as sees endocrinology.  Recent notes and changes reviewed. - Foot and eye exams up to date - Vaccinations up to date - ACE and  Statin on board.      Relevant Orders   Microalbumin, Urine Waived (Completed)   Comprehensive metabolic panel   CBC with Differential/Platelet   Lipid panel   Bayer DCA Hb A1c Waived (STAT) (Completed)   Magnesium   Hyperlipidemia associated with type 2 diabetes mellitus (HCC)   Relevant Orders   Comprehensive metabolic panel   CBC with Differential/Platelet   Lipid panel   Bayer DCA Hb A1c Waived (STAT) (Completed)   Magnesium   Type 2 diabetes mellitus with hyperglycemia, with long-term current use of insulin (HCC)    Chronic, ongoing.  Followed by endocrinology with A1c 8.8% today (trend down) and urine ALB 150 (July 2024) -- continue Ramipril for kidney protection.  Continue current medication regimen as prescribed by endocrinology, notes reviewed.  CCM collaboration continues -- may need help with cost of medications in future. Recommend he check BS at least twice a day, fasting and 2 hours after a meal.  Plan on 6 month follow-up as sees endocrinology.  Recent notes and changes reviewed. - Foot and eye exams up to date - Vaccinations up to date - ACE and Statin on board.      Relevant Orders   Comprehensive metabolic panel   CBC with Differential/Platelet   Lipid panel   Bayer DCA Hb A1c Waived (STAT) (Completed)   Magnesium   Type 2 diabetes mellitus with proteinuria (HCC) - Primary    Refer to CKD plan of care.      Relevant Orders   Microalbumin, Urine Waived (Completed)   Comprehensive metabolic panel   CBC with Differential/Platelet   Lipid panel   Bayer DCA Hb A1c Waived (STAT) (Completed)   Magnesium     Other   COVID-19    Ongoing cough for over a week now, has tested negative since initial positive over a week ago.  History of pneumonia recently, will obtain chest imaging to further assess.  Start Azithromycin and Albuterol inhaler to use as needed.  Tussionex sent in for night time relief.  If pneumonia present will add on Doxycyline as is allergic to  Penicillin.  Recommend: - Increased rest - Increasing Fluids - Acetaminophen as needed for fever/pain.  - Mucinex.  - Humidifying the air.  Plan on return if pneumonia on imaging or worsening/ongoing.      Relevant Medications   azithromycin (ZITHROMAX) 250 MG tablet   Other Relevant Orders   DG Chest 2 View   Comprehensive metabolic panel   CBC with Differential/Platelet   Lipid panel   Bayer DCA Hb A1c Waived (STAT) (Completed)   Magnesium   Hypertriglyceridemia    Ongoing issue with statin, Fenofibrate, and Fish Oil on board, will recheck Lipid panel today and continue collaboration with cardiology.  Attempted to get Repatha recently but insurance denied this.       Relevant Orders   Comprehensive metabolic panel   CBC with Differential/Platelet   Lipid panel   Bayer DCA Hb A1c Waived (STAT) (Completed)   Magnesium   Hypomagnesemia    Lows on labs and taking supplement.  Recheck today  and adjust as needed.  Recommend cut back on Protonix use.      Relevant Orders   Comprehensive metabolic panel   CBC with Differential/Platelet   Lipid panel   Bayer DCA Hb A1c Waived (STAT) (Completed)   Magnesium   Insomnia    Chronic, ongoing.   Belsomra too costly.  Would avoid Ambien and benzo use due to age and BEERS.  Continue Amtriptyline 40 MG at night which has offered benefit to him. Continue to monitor and adjust regimen as needed.  Recommend sleep hygiene techniques.  Could consider therapy for CBT in future.        Relevant Orders   Comprehensive metabolic panel   CBC with Differential/Platelet   Lipid panel   Bayer DCA Hb A1c Waived (STAT) (Completed)   Magnesium     Follow up plan: Return in about 3 months (around 07/03/2023) for T2DM, HTN/HLD, CKD.

## 2023-04-02 NOTE — Assessment & Plan Note (Signed)
Ongoing cough for over a week now, has tested negative since initial positive over a week ago.  History of pneumonia recently, will obtain chest imaging to further assess.  Start Azithromycin and Albuterol inhaler to use as needed.  Tussionex sent in for night time relief.  If pneumonia present will add on Doxycyline as is allergic to Penicillin.  Recommend: - Increased rest - Increasing Fluids - Acetaminophen as needed for fever/pain.  - Mucinex.  - Humidifying the air.  Plan on return if pneumonia on imaging or worsening/ongoing.

## 2023-04-02 NOTE — Assessment & Plan Note (Signed)
Refer to CKD plan of care. 

## 2023-04-02 NOTE — Assessment & Plan Note (Signed)
Chronic, ongoing.   Belsomra too costly.  Would avoid Ambien and benzo use due to age and BEERS.  Continue Amtriptyline 40 MG at night which has offered benefit to him. Continue to monitor and adjust regimen as needed.  Recommend sleep hygiene techniques.  Could consider therapy for CBT in future.

## 2023-04-02 NOTE — Assessment & Plan Note (Signed)
Ongoing issue with statin, Fenofibrate, and Fish Oil on board, will recheck Lipid panel today and continue collaboration with cardiology.  Attempted to get Repatha recently but insurance denied this.

## 2023-04-03 LAB — COMPREHENSIVE METABOLIC PANEL
ALT: 14 IU/L (ref 0–44)
AST: 14 IU/L (ref 0–40)
Albumin: 4.1 g/dL (ref 3.8–4.8)
Alkaline Phosphatase: 81 IU/L (ref 44–121)
BUN/Creatinine Ratio: 18 (ref 10–24)
BUN: 25 mg/dL (ref 8–27)
Bilirubin Total: 0.2 mg/dL (ref 0.0–1.2)
CO2: 23 mmol/L (ref 20–29)
Calcium: 9.8 mg/dL (ref 8.6–10.2)
Chloride: 104 mmol/L (ref 96–106)
Creatinine, Ser: 1.4 mg/dL — ABNORMAL HIGH (ref 0.76–1.27)
Globulin, Total: 2.4 g/dL (ref 1.5–4.5)
Glucose: 124 mg/dL — ABNORMAL HIGH (ref 70–99)
Potassium: 4.6 mmol/L (ref 3.5–5.2)
Sodium: 142 mmol/L (ref 134–144)
Total Protein: 6.5 g/dL (ref 6.0–8.5)
eGFR: 53 mL/min/{1.73_m2} — ABNORMAL LOW (ref >59)

## 2023-04-03 LAB — CBC WITH DIFFERENTIAL/PLATELET
Basophils Absolute: 0 10*3/uL (ref 0.0–0.2)
Basos: 1 %
EOS (ABSOLUTE): 0.2 10*3/uL (ref 0.0–0.4)
Eos: 5 %
Hematocrit: 43.1 % (ref 37.5–51.0)
Hemoglobin: 14.4 g/dL (ref 13.0–17.7)
Immature Grans (Abs): 0 10*3/uL (ref 0.0–0.1)
Immature Granulocytes: 1 %
Lymphocytes Absolute: 1.4 10*3/uL (ref 0.7–3.1)
Lymphs: 37 %
MCH: 30.3 pg (ref 26.6–33.0)
MCHC: 33.4 g/dL (ref 31.5–35.7)
MCV: 91 fL (ref 79–97)
Monocytes Absolute: 0.3 10*3/uL (ref 0.1–0.9)
Monocytes: 8 %
Neutrophils Absolute: 1.9 10*3/uL (ref 1.4–7.0)
Neutrophils: 48 %
Platelets: 287 10*3/uL (ref 150–450)
RBC: 4.75 x10E6/uL (ref 4.14–5.80)
RDW: 14.1 % (ref 11.6–15.4)
WBC: 3.8 10*3/uL (ref 3.4–10.8)

## 2023-04-03 LAB — LIPID PANEL
Chol/HDL Ratio: 4.9 ratio (ref 0.0–5.0)
Cholesterol, Total: 133 mg/dL (ref 100–199)
HDL: 27 mg/dL — ABNORMAL LOW (ref >39)
LDL Chol Calc (NIH): 75 mg/dL (ref 0–99)
Triglycerides: 184 mg/dL — ABNORMAL HIGH (ref 0–149)
VLDL Cholesterol Cal: 31 mg/dL (ref 5–40)

## 2023-04-03 LAB — MAGNESIUM: Magnesium: 1.6 mg/dL (ref 1.6–2.3)

## 2023-04-03 NOTE — Progress Notes (Signed)
Contacted via MyChart   Good morning Neco, your labs have returned: - Cholesterol levels look good this check, the lowest I have seen your triglycerides -- they are still a bit elevated, but not as much as past checks.  Continue Rosuvastatin and Fenofibrate. - Kidney function, creatinine and eGFR, show ongoing chronic kidney disease stage 3a.  We will continue to monitor closely.  Ensure good water intake at home and avoid Ibuprofen products. - Magnesium and CBC (blood counts) are normal. - A1c is trending down this check from 10% to now 8.8% -- continue to follow with Dr. Gershon Crane.  Any questions? Keep being amazing!!  Thank you for allowing me to participate in your care.  I appreciate you. Kindest regards, Beauford Lando

## 2023-04-07 ENCOUNTER — Telehealth: Payer: Self-pay

## 2023-04-07 NOTE — Telephone Encounter (Signed)
LVM for patient to return call about recommendations for what cough syrup that he should use since the prescribed cough syrup is not covered

## 2023-04-11 NOTE — Progress Notes (Signed)
Contacted via MyChart   Pneumonia is all clear:)

## 2023-04-22 DIAGNOSIS — I2510 Atherosclerotic heart disease of native coronary artery without angina pectoris: Secondary | ICD-10-CM | POA: Diagnosis not present

## 2023-04-22 DIAGNOSIS — J690 Pneumonitis due to inhalation of food and vomit: Secondary | ICD-10-CM | POA: Diagnosis not present

## 2023-04-22 DIAGNOSIS — Z7982 Long term (current) use of aspirin: Secondary | ICD-10-CM | POA: Diagnosis not present

## 2023-04-22 DIAGNOSIS — Z7902 Long term (current) use of antithrombotics/antiplatelets: Secondary | ICD-10-CM | POA: Diagnosis not present

## 2023-04-22 DIAGNOSIS — I10 Essential (primary) hypertension: Secondary | ICD-10-CM | POA: Diagnosis not present

## 2023-04-22 DIAGNOSIS — R0789 Other chest pain: Secondary | ICD-10-CM | POA: Diagnosis not present

## 2023-04-22 DIAGNOSIS — R0781 Pleurodynia: Secondary | ICD-10-CM | POA: Diagnosis not present

## 2023-04-22 DIAGNOSIS — Z7984 Long term (current) use of oral hypoglycemic drugs: Secondary | ICD-10-CM | POA: Diagnosis not present

## 2023-04-22 DIAGNOSIS — E785 Hyperlipidemia, unspecified: Secondary | ICD-10-CM | POA: Diagnosis not present

## 2023-04-22 DIAGNOSIS — R079 Chest pain, unspecified: Secondary | ICD-10-CM | POA: Diagnosis not present

## 2023-04-22 DIAGNOSIS — R109 Unspecified abdominal pain: Secondary | ICD-10-CM | POA: Diagnosis not present

## 2023-04-22 DIAGNOSIS — E119 Type 2 diabetes mellitus without complications: Secondary | ICD-10-CM | POA: Diagnosis not present

## 2023-04-22 DIAGNOSIS — Z792 Long term (current) use of antibiotics: Secondary | ICD-10-CM | POA: Diagnosis not present

## 2023-05-13 ENCOUNTER — Other Ambulatory Visit: Payer: Self-pay | Admitting: Nurse Practitioner

## 2023-05-14 NOTE — Telephone Encounter (Signed)
Requested Prescriptions  Pending Prescriptions Disp Refills   atenolol (TENORMIN) 50 MG tablet [Pharmacy Med Name: ATENOLOL 50 MG Tablet] 90 tablet 1    Sig: TAKE 1 TABLET EVERY DAY     Cardiovascular: Beta Blockers 2 Failed - 05/13/2023  2:24 AM      Failed - Cr in normal range and within 360 days    Creatinine  Date Value Ref Range Status  09/20/2014 1.10 0.60 - 1.30 mg/dL Final   Creatinine, Ser  Date Value Ref Range Status  04/02/2023 1.40 (H) 0.76 - 1.27 mg/dL Final         Passed - Last BP in normal range    BP Readings from Last 1 Encounters:  04/02/23 134/70         Passed - Last Heart Rate in normal range    Pulse Readings from Last 1 Encounters:  04/02/23 60         Passed - Valid encounter within last 6 months    Recent Outpatient Visits           1 month ago Type 2 diabetes mellitus with proteinuria (HCC)   [place] [facility] [place], [place] T, NP   3 months ago Left nephrolithiasis   [place] [facility] Mecum, Oswaldo Conroy, PA-C   4 months ago Community acquired pneumonia of right lower lobe of lung   [place] [facility] [place], [place] T, NP   5 months ago AKI (acute kidney injury) (HCC)   [place] [facility] [place], Corrie Dandy T, NP   6 months ago Sinus congestion   [place] [facility] [place], [place] T, NP       Future Appointments             In 1 month [place], Aldine T, NP [place] [name]             clopidogrel (PLAVIX) 75 MG tablet [Pharmacy Med Name: CLOPIDOGREL 75 MG Tablet] 90 tablet 1    Sig: TAKE 1 TABLET EVERY DAY     Hematology: Antiplatelets - clopidogrel Failed - 05/13/2023  2:24 AM      Failed - Cr in normal range and within 360 days    Creatinine  Date Value Ref Range Status  09/20/2014 1.10 0.60 - 1.30 mg/dL Final   Creatinine, Ser  Date Value Ref Range Status  04/02/2023 1.40 (H) 0.76 - 1.27 mg/dL Final          Passed - HCT in normal range and within 180 days    Hematocrit  Date Value Ref Range Status  04/02/2023 43.1 37.5 - 51.0 % Final         Passed - HGB in normal range and within 180 days    Hemoglobin  Date Value Ref Range Status  04/02/2023 14.4 13.0 - 17.7 g/dL Final         Passed - PLT in normal range and within 180 days    Platelets  Date Value Ref Range Status  04/02/2023 287 150 - 450 x10E3/uL Final         Passed - Valid encounter within last 6 months    Recent Outpatient Visits           1 month ago Type 2 diabetes mellitus with proteinuria (HCC)   [place] [facility] [place], Corrie Dandy T, NP   3 months ago Left nephrolithiasis   [place] [facility]  Mecum, Erin E, PA-C   4 months ago Community acquired pneumonia of right lower lobe of lung   [place] [facility] [place], [place] T, NP   5 months ago AKI (acute kidney injury) (HCC)   [place] [facility] [place], Corrie Dandy T, NP   6 months ago Sinus congestion   [place] [facility] [place], Dorie Rank, NP       Future Appointments             In 1 month Cannady, Dorie Rank, NP  [name]

## 2023-06-08 DIAGNOSIS — E782 Mixed hyperlipidemia: Secondary | ICD-10-CM | POA: Diagnosis not present

## 2023-06-08 DIAGNOSIS — R0789 Other chest pain: Secondary | ICD-10-CM | POA: Diagnosis not present

## 2023-06-08 DIAGNOSIS — I10 Essential (primary) hypertension: Secondary | ICD-10-CM | POA: Diagnosis not present

## 2023-06-08 DIAGNOSIS — I2510 Atherosclerotic heart disease of native coronary artery without angina pectoris: Secondary | ICD-10-CM | POA: Diagnosis not present

## 2023-06-17 DIAGNOSIS — I2510 Atherosclerotic heart disease of native coronary artery without angina pectoris: Secondary | ICD-10-CM | POA: Diagnosis not present

## 2023-06-17 DIAGNOSIS — R0789 Other chest pain: Secondary | ICD-10-CM | POA: Diagnosis not present

## 2023-06-21 DIAGNOSIS — R9439 Abnormal result of other cardiovascular function study: Secondary | ICD-10-CM | POA: Diagnosis not present

## 2023-06-21 DIAGNOSIS — E782 Mixed hyperlipidemia: Secondary | ICD-10-CM | POA: Diagnosis not present

## 2023-06-21 DIAGNOSIS — I10 Essential (primary) hypertension: Secondary | ICD-10-CM | POA: Diagnosis not present

## 2023-06-21 DIAGNOSIS — R079 Chest pain, unspecified: Secondary | ICD-10-CM | POA: Diagnosis not present

## 2023-06-21 DIAGNOSIS — I2510 Atherosclerotic heart disease of native coronary artery without angina pectoris: Secondary | ICD-10-CM | POA: Diagnosis not present

## 2023-06-21 DIAGNOSIS — Z01818 Encounter for other preprocedural examination: Secondary | ICD-10-CM | POA: Diagnosis not present

## 2023-07-05 ENCOUNTER — Ambulatory Visit: Payer: Medicare PPO | Admitting: Nurse Practitioner

## 2023-07-10 ENCOUNTER — Other Ambulatory Visit: Payer: Self-pay | Admitting: Nurse Practitioner

## 2023-07-12 NOTE — Telephone Encounter (Signed)
Requested by interface surescripts. Future visit in 1 week.  Requested Prescriptions  Pending Prescriptions Disp Refills   metFORMIN (GLUCOPHAGE) 500 MG tablet [Pharmacy Med Name: metFORMIN HCl Oral Tablet 500 MG] 360 tablet 0    Sig: TAKE 2 TABLETS TWICE A DAY WITH MEALS     Endocrinology:  Diabetes - Biguanides Failed - 07/10/2023  2:24 AM      Failed - Cr in normal range and within 360 days    Creatinine  Date Value Ref Range Status  09/20/2014 1.10 0.60 - 1.30 mg/dL Final   Creatinine, Ser  Date Value Ref Range Status  04/02/2023 1.40 (H) 0.76 - 1.27 mg/dL Final         Failed - HBA1C is between 0 and 7.9 and within 180 days    Hemoglobin A1C  Date Value Ref Range Status  11/06/2019 7.2  Final   HB A1C (BAYER DCA - WAIVED)  Date Value Ref Range Status  04/02/2023 8.8 (H) 4.8 - 5.6 % Final    Comment:             Prediabetes: 5.7 - 6.4          Diabetes: >6.4          Glycemic control for adults with diabetes: <7.0          Failed - eGFR in normal range and within 360 days    EGFR (African American)  Date Value Ref Range Status  09/20/2014 >60 >39mL/min Final  05/29/2014 >60  Final   GFR calc Af Amer  Date Value Ref Range Status  09/03/2020 74 >59 mL/min/1.73 Final    Comment:    **In accordance with recommendations from the NKF-ASN Task force,**   [facility] is in the process of updating its eGFR calculation to the   2021 CKD-EPI creatinine equation that estimates kidney function   without a race variable.    EGFR (Non-African Amer.)  Date Value Ref Range Status  09/20/2014 >60 >64mL/min Final    Comment:    eGFR values <50mL/min/1.73 m2 may be an indication of chronic kidney disease (CKD). Calculated eGFR, using the MRDR Study equation, is useful in  patients with stable renal function. The eGFR calculation will not be reliable in acutely ill patients when serum creatinine is changing rapidly. It is not useful in patients on dialysis. The eGFR calculation  may not be applicable to patients at the low and high extremes of body sizes, pregnant women, and vegetarians.   05/29/2014 >60  Final    Comment:    eGFR values <17mL/min/1.73 m2 may be an indication of chronic kidney disease (CKD). Calculated eGFR is useful in patients with stable renal function. The eGFR calculation will not be reliable in acutely ill patients when serum creatinine is changing rapidly. It is not useful in  patients on dialysis. The eGFR calculation may not be applicable to patients at the low and high extremes of body sizes, pregnant women, and vegetarians.    GFR, Estimated  Date Value Ref Range Status  11/13/2022 53 (L) >60 mL/min Final    Comment:    (NOTE) Calculated using the CKD-EPI Creatinine Equation (2021)    eGFR  Date Value Ref Range Status  04/02/2023 53 (L) >59 mL/min/1.73 Final         Passed - B12 Level in normal range and within 720 days    Vitamin B-12  Date Value Ref Range Status  07/31/2022 434 232 - 1,245 pg/mL Final  Passed - Valid encounter within last 6 months    Recent Outpatient Visits           3 months ago Type 2 diabetes mellitus with proteinuria (HCC)   [place] [facility] [place], [place] T, NP   5 months ago Left nephrolithiasis   [place] [facility] Mecum, Oswaldo Conroy, PA-C   6 months ago Community acquired pneumonia of right lower lobe of lung   [place] [facility] [place], Lennon T, NP   7 months ago AKI (acute kidney injury) (HCC)   [place] [facility] [place], Corrie Dandy T, NP   8 months ago Sinus congestion   [place] [facility] [place], [place] T, NP       Future Appointments             In 1 week Cannady, Dorie Rank, NP  [name]            Passed - CBC within normal limits and completed in the last 12 months    WBC  Date Value Ref Range Status  04/02/2023 3.8 3.4 - 10.8 x10E3/uL Final   11/13/2022 4.1 4.0 - 10.5 K/uL Final   RBC  Date Value Ref Range Status  04/02/2023 4.75 4.14 - 5.80 x10E6/uL Final  11/13/2022 4.32 4.22 - 5.81 MIL/uL Final   Hemoglobin  Date Value Ref Range Status  04/02/2023 14.4 13.0 - 17.7 g/dL Final   Hematocrit  Date Value Ref Range Status  04/02/2023 43.1 37.5 - 51.0 % Final   MCHC  Date Value Ref Range Status  04/02/2023 33.4 31.5 - 35.7 g/dL Final  16/06/9603 54.0 30.0 - 36.0 g/dL Final   [facility]  Date Value Ref Range Status  04/02/2023 30.3 26.6 - 33.0 pg Final  11/13/2022 29.9 26.0 - 34.0 pg Final   MCV  Date Value Ref Range Status  04/02/2023 91 79 - 97 fL Final  08/16/2014 95 80 - 100 fL Final   No results found for: "PLTCOUNTKUC", "LABPLAT", "POCPLA" RDW  Date Value Ref Range Status  04/02/2023 14.1 11.6 - 15.4 % Final  08/16/2014 13.4 11.5 - 14.5 % Final          ramipril (ALTACE) 1.25 MG capsule [Pharmacy Med Name: Ramipril Oral Capsule 1.25 MG] 90 capsule 0    Sig: TAKE 1 CAPSULE EVERY DAY     Cardiovascular:  ACE Inhibitors Failed - 07/10/2023  2:24 AM      Failed - Cr in normal range and within 180 days    Creatinine  Date Value Ref Range Status  09/20/2014 1.10 0.60 - 1.30 mg/dL Final   Creatinine, Ser  Date Value Ref Range Status  04/02/2023 1.40 (H) 0.76 - 1.27 mg/dL Final         Passed - K in normal range and within 180 days    Potassium  Date Value Ref Range Status  04/02/2023 4.6 3.5 - 5.2 mmol/L Final  08/16/2014 4.9 3.5 - 5.1 mmol/L Final         Passed - Patient is not pregnant      Passed - Last BP in normal range    BP Readings from Last 1 Encounters:  04/02/23 134/70         Passed - Valid encounter within last 6 months    Recent Outpatient Visits           3 months ago Type 2 diabetes mellitus with proteinuria (  HCC)   [place] [facility] [place], [place] T, NP   5 months ago Left nephrolithiasis   [place] [facility] Mecum, Oswaldo Conroy, PA-C   6  months ago Community acquired pneumonia of right lower lobe of lung   [place] [facility] [place], [place] T, NP   7 months ago AKI (acute kidney injury) (HCC)   [place] [facility] [place], Corrie Dandy T, NP   8 months ago Sinus congestion   [place] [facility] [place], [place] T, NP       Future Appointments             In 1 week Cannady, Dorie Rank, NP [place] [name]             fenofibrate (TRICOR) 145 MG tablet [Pharmacy Med Name: Fenofibrate Oral Tablet 145 MG] 90 tablet 0    Sig: TAKE 1 TABLET EVERY DAY     Cardiovascular:  Antilipid - Fibric Acid Derivatives Failed - 07/10/2023  2:24 AM      Failed - Cr in normal range and within 360 days    Creatinine  Date Value Ref Range Status  09/20/2014 1.10 0.60 - 1.30 mg/dL Final   Creatinine, Ser  Date Value Ref Range Status  04/02/2023 1.40 (H) 0.76 - 1.27 mg/dL Final         Failed - Lipid Panel in normal range within the last 12 months    Cholesterol, Total  Date Value Ref Range Status  04/02/2023 133 100 - 199 mg/dL Final   LDL Chol Calc (NIH)  Date Value Ref Range Status  04/02/2023 75 0 - 99 mg/dL Final   HDL  Date Value Ref Range Status  04/02/2023 27 (L) >39 mg/dL Final   Triglycerides  Date Value Ref Range Status  04/02/2023 184 (H) 0 - 149 mg/dL Final         Passed - ALT in normal range and within 360 days    ALT  Date Value Ref Range Status  04/02/2023 14 0 - 44 IU/L Final         Passed - AST in normal range and within 360 days    AST  Date Value Ref Range Status  04/02/2023 14 0 - 40 IU/L Final         Passed - HGB in normal range and within 360 days    Hemoglobin  Date Value Ref Range Status  04/02/2023 14.4 13.0 - 17.7 g/dL Final         Passed - HCT in normal range and within 360 days    Hematocrit  Date Value Ref Range Status  04/02/2023 43.1 37.5 - 51.0 % Final         Passed - PLT in normal range and  within 360 days    Platelets  Date Value Ref Range Status  04/02/2023 287 150 - 450 x10E3/uL Final         Passed - WBC in normal range and within 360 days    WBC  Date Value Ref Range Status  04/02/2023 3.8 3.4 - 10.8 x10E3/uL Final  11/13/2022 4.1 4.0 - 10.5 K/uL Final         Passed - eGFR is 30 or above and within 360 days    EGFR (African American)  Date Value Ref Range Status  09/20/2014 >60 >62mL/min Final  05/29/2014 >60  Final   GFR calc Af Denyse Dago  Date Value Ref Range Status  09/03/2020 74 >59 mL/min/1.73 Final    Comment:    **In accordance with recommendations from the NKF-ASN Task force,**   [facility] is in the process of updating its eGFR calculation to the   2021 CKD-EPI creatinine equation that estimates kidney function   without a race variable.    EGFR (Non-African Amer.)  Date Value Ref Range Status  09/20/2014 >60 >41mL/min Final    Comment:    eGFR values <5mL/min/1.73 m2 may be an indication of chronic kidney disease (CKD). Calculated eGFR, using the MRDR Study equation, is useful in  patients with stable renal function. The eGFR calculation will not be reliable in acutely ill patients when serum creatinine is changing rapidly. It is not useful in patients on dialysis. The eGFR calculation may not be applicable to patients at the low and high extremes of body sizes, pregnant women, and vegetarians.   05/29/2014 >60  Final    Comment:    eGFR values <70mL/min/1.73 m2 may be an indication of chronic kidney disease (CKD). Calculated eGFR is useful in patients with stable renal function. The eGFR calculation will not be reliable in acutely ill patients when serum creatinine is changing rapidly. It is not useful in  patients on dialysis. The eGFR calculation may not be applicable to patients at the low and high extremes of body sizes, pregnant women, and vegetarians.    GFR, Estimated  Date Value Ref Range Status  11/13/2022 53 (L) >60 mL/min  Final    Comment:    (NOTE) Calculated using the CKD-EPI Creatinine Equation (2021)    eGFR  Date Value Ref Range Status  04/02/2023 53 (L) >59 mL/min/1.73 Final         Passed - Valid encounter within last 12 months    Recent Outpatient Visits           3 months ago Type 2 diabetes mellitus with proteinuria (HCC)   [place] [facility] [place], Corrie Dandy T, NP   5 months ago Left nephrolithiasis   [place] [facility] Mecum, Oswaldo Conroy, PA-C   6 months ago Community acquired pneumonia of right lower lobe of lung   [place] [facility] [place], Busby T, NP   7 months ago AKI (acute kidney injury) (HCC)   [place] [facility] [place], Corrie Dandy T, NP   8 months ago Sinus congestion   [place] [facility] [place], Dorie Rank, NP       Future Appointments             In 1 week Cannady, Dorie Rank, NP [place] [name]

## 2023-07-15 ENCOUNTER — Observation Stay
Admission: RE | Admit: 2023-07-15 | Discharge: 2023-07-16 | Disposition: A | Discharge status: Home / Self Care | Payer: Medicare PPO | Attending: Cardiology | Admitting: Cardiology

## 2023-07-15 ENCOUNTER — Other Ambulatory Visit: Payer: Self-pay

## 2023-07-15 ENCOUNTER — Encounter
Admission: RE | Disposition: A | Discharge status: Home / Self Care | Payer: Self-pay | Source: Home / Self Care | Attending: Cardiology

## 2023-07-15 ENCOUNTER — Encounter: Payer: Self-pay | Admitting: Cardiology

## 2023-07-15 DIAGNOSIS — Z951 Presence of aortocoronary bypass graft: Secondary | ICD-10-CM | POA: Insufficient documentation

## 2023-07-15 DIAGNOSIS — Z7982 Long term (current) use of aspirin: Secondary | ICD-10-CM | POA: Diagnosis not present

## 2023-07-15 DIAGNOSIS — I10 Essential (primary) hypertension: Secondary | ICD-10-CM | POA: Insufficient documentation

## 2023-07-15 DIAGNOSIS — Z87891 Personal history of nicotine dependence: Secondary | ICD-10-CM | POA: Insufficient documentation

## 2023-07-15 DIAGNOSIS — E119 Type 2 diabetes mellitus without complications: Secondary | ICD-10-CM | POA: Diagnosis not present

## 2023-07-15 DIAGNOSIS — Z7984 Long term (current) use of oral hypoglycemic drugs: Secondary | ICD-10-CM | POA: Diagnosis not present

## 2023-07-15 DIAGNOSIS — Z794 Long term (current) use of insulin: Secondary | ICD-10-CM | POA: Insufficient documentation

## 2023-07-15 DIAGNOSIS — R9439 Abnormal result of other cardiovascular function study: Secondary | ICD-10-CM

## 2023-07-15 DIAGNOSIS — R001 Bradycardia, unspecified: Secondary | ICD-10-CM | POA: Diagnosis not present

## 2023-07-15 DIAGNOSIS — I2510 Atherosclerotic heart disease of native coronary artery without angina pectoris: Principal | ICD-10-CM | POA: Diagnosis present

## 2023-07-15 DIAGNOSIS — I25790 Atherosclerosis of other coronary artery bypass graft(s) with unstable angina pectoris: Secondary | ICD-10-CM | POA: Diagnosis not present

## 2023-07-15 DIAGNOSIS — Z79899 Other long term (current) drug therapy: Secondary | ICD-10-CM | POA: Diagnosis not present

## 2023-07-15 HISTORY — PX: LEFT HEART CATH AND CORS/GRAFTS ANGIOGRAPHY: CATH118250

## 2023-07-15 HISTORY — PX: CORONARY STENT INTERVENTION: CATH118234

## 2023-07-15 LAB — GLUCOSE, CAPILLARY: Glucose-Capillary: 133 mg/dL — ABNORMAL HIGH (ref 70–99)

## 2023-07-15 LAB — POCT ACTIVATED CLOTTING TIME: Activated Clotting Time: 331 s

## 2023-07-15 SURGERY — LEFT HEART CATH AND CORS/GRAFTS ANGIOGRAPHY
Anesthesia: Moderate Sedation

## 2023-07-15 MED ORDER — MIDAZOLAM HCL 2 MG/2ML IJ SOLN
INTRAMUSCULAR | Status: DC | PRN
  Administered 2023-07-15 (×2): 1 mg via INTRAVENOUS

## 2023-07-15 MED ORDER — LIDOCAINE HCL 1 % IJ SOLN
INTRAMUSCULAR | Status: AC
  Filled 2023-07-15: qty 20

## 2023-07-15 MED ORDER — CLOPIDOGREL BISULFATE 75 MG PO TABS
75.0000 mg | ORAL_TABLET | Freq: Every day | ORAL | Status: DC
  Administered 2023-07-16: 75 mg via ORAL
  Filled 2023-07-15: qty 1

## 2023-07-15 MED ORDER — ASPIRIN 81 MG PO CHEW: 81.0000 mg | CHEWABLE_TABLET | ORAL | Status: DC

## 2023-07-15 MED ORDER — CLOPIDOGREL BISULFATE 75 MG PO TABS
ORAL_TABLET | ORAL | Status: DC | PRN
  Administered 2023-07-15: 225 mg via ORAL

## 2023-07-15 MED ORDER — MIDAZOLAM HCL 2 MG/2ML IJ SOLN
INTRAMUSCULAR | Status: AC
  Filled 2023-07-15: qty 2

## 2023-07-15 MED ORDER — FENOFIBRATE 160 MG PO TABS
160.0000 mg | ORAL_TABLET | Freq: Every day | ORAL | Status: DC
  Filled 2023-07-15: qty 1

## 2023-07-15 MED ORDER — ASPIRIN 81 MG PO CHEW
81.0000 mg | CHEWABLE_TABLET | Freq: Every day | ORAL | Status: DC
  Administered 2023-07-16: 81 mg via ORAL
  Filled 2023-07-15: qty 1

## 2023-07-15 MED ORDER — NITROGLYCERIN 1 MG/10 ML FOR IR/CATH LAB
INTRA_ARTERIAL | Status: AC
  Filled 2023-07-15: qty 10

## 2023-07-15 MED ORDER — FENOFIBRATE 160 MG PO TABS
160.0000 mg | ORAL_TABLET | Freq: Every day | ORAL | Status: DC
  Administered 2023-07-16: 160 mg via ORAL
  Filled 2023-07-15: qty 1

## 2023-07-15 MED ORDER — PANTOPRAZOLE SODIUM 40 MG PO TBEC
40.0000 mg | DELAYED_RELEASE_TABLET | Freq: Every day | ORAL | Status: DC
  Administered 2023-07-16: 40 mg via ORAL
  Filled 2023-07-15: qty 1

## 2023-07-15 MED ORDER — BIVALIRUDIN TRIFLUOROACETATE 250 MG IV SOLR
INTRAVENOUS | Status: AC
  Filled 2023-07-15: qty 250

## 2023-07-15 MED ORDER — GLIPIZIDE ER 10 MG PO TB24
10.0000 mg | ORAL_TABLET | Freq: Every day | ORAL | Status: DC
  Administered 2023-07-16: 10 mg via ORAL
  Filled 2023-07-15: qty 1

## 2023-07-15 MED ORDER — LABETALOL HCL 5 MG/ML IV SOLN: 10.0000 mg | INTRAVENOUS | Status: AC | PRN

## 2023-07-15 MED ORDER — HEPARIN (PORCINE) IN NACL 2000-0.9 UNIT/L-% IV SOLN
INTRAVENOUS | Status: DC | PRN
  Administered 2023-07-15: 1000 mL

## 2023-07-15 MED ORDER — SODIUM CHLORIDE 0.9% FLUSH
3.0000 mL | Freq: Two times a day (BID) | INTRAVENOUS | Status: DC
Start: 2023-07-15 — End: 2023-07-15

## 2023-07-15 MED ORDER — SODIUM CHLORIDE 0.9 % WEIGHT BASED INFUSION: 1.0000 mL/kg/h | INTRAVENOUS | Status: DC

## 2023-07-15 MED ORDER — BIVALIRUDIN BOLUS VIA INFUSION - CUPID
INTRAVENOUS | Status: DC | PRN
  Administered 2023-07-15: 63.975 mg via INTRAVENOUS

## 2023-07-15 MED ORDER — NITROGLYCERIN 1 MG/10 ML FOR IR/CATH LAB
INTRA_ARTERIAL | Status: DC | PRN
  Administered 2023-07-15 (×2): 200 ug

## 2023-07-15 MED ORDER — CLOPIDOGREL BISULFATE 75 MG PO TABS
ORAL_TABLET | ORAL | Status: AC
Start: 2023-07-15 — End: ?
  Filled 2023-07-15: qty 3

## 2023-07-15 MED ORDER — HEPARIN (PORCINE) IN NACL 1000-0.9 UT/500ML-% IV SOLN
INTRAVENOUS | Status: AC
Start: 2023-07-15 — End: ?
  Filled 2023-07-15: qty 1000

## 2023-07-15 MED ORDER — ACETAMINOPHEN 325 MG PO TABS
650.0000 mg | ORAL_TABLET | ORAL | Status: DC | PRN
Start: 2023-07-15 — End: 2023-07-16
  Administered 2023-07-15: 650 mg via ORAL
  Filled 2023-07-15: qty 2

## 2023-07-15 MED ORDER — LIDOCAINE HCL (PF) 1 % IJ SOLN
INTRAMUSCULAR | Status: DC | PRN
  Administered 2023-07-15: 5 mL

## 2023-07-15 MED ORDER — SODIUM CHLORIDE 0.9 % IV SOLN
INTRAVENOUS | Status: AC | PRN
  Administered 2023-07-15: 1.75 mg/kg/h via INTRAVENOUS

## 2023-07-15 MED ORDER — VERAPAMIL HCL 2.5 MG/ML IV SOLN
INTRAVENOUS | Status: AC
Start: 2023-07-15 — End: ?
  Filled 2023-07-15: qty 2

## 2023-07-15 MED ORDER — ASPIRIN 81 MG PO CHEW
CHEWABLE_TABLET | ORAL | Status: DC | PRN
  Administered 2023-07-15: 243 mg via ORAL

## 2023-07-15 MED ORDER — SODIUM CHLORIDE 0.9% FLUSH: 3.0000 mL | INTRAVENOUS | Status: DC | PRN

## 2023-07-15 MED ORDER — FENTANYL CITRATE (PF) 100 MCG/2ML IJ SOLN
INTRAMUSCULAR | Status: AC
  Filled 2023-07-15: qty 2

## 2023-07-15 MED ORDER — EMPAGLIFLOZIN 25 MG PO TABS
25.0000 mg | ORAL_TABLET | Freq: Every day | ORAL | Status: DC
  Administered 2023-07-16: 25 mg via ORAL
  Filled 2023-07-15: qty 1

## 2023-07-15 MED ORDER — SODIUM CHLORIDE 0.9 % IV SOLN: 250.0000 mL | INTRAVENOUS | Status: DC | PRN

## 2023-07-15 MED ORDER — HYDRALAZINE HCL 20 MG/ML IJ SOLN: 10.0000 mg | INTRAMUSCULAR | Status: AC | PRN

## 2023-07-15 MED ORDER — ONDANSETRON HCL 4 MG/2ML IJ SOLN: 4.0000 mg | Freq: Four times a day (QID) | INTRAMUSCULAR | Status: DC | PRN

## 2023-07-15 MED ORDER — HEPARIN SODIUM (PORCINE) 1000 UNIT/ML IJ SOLN
INTRAMUSCULAR | Status: AC
  Filled 2023-07-15: qty 10

## 2023-07-15 MED ORDER — RAMIPRIL 1.25 MG PO CAPS
1.2500 mg | ORAL_CAPSULE | Freq: Every day | ORAL | Status: DC
  Filled 2023-07-15: qty 1

## 2023-07-15 MED ORDER — ZOLPIDEM TARTRATE 5 MG PO TABS
5.0000 mg | ORAL_TABLET | Freq: Once | ORAL | Status: AC
  Administered 2023-07-15: 5 mg via ORAL
  Filled 2023-07-15: qty 1

## 2023-07-15 MED ORDER — ASPIRIN 81 MG PO CHEW
CHEWABLE_TABLET | ORAL | Status: AC
  Filled 2023-07-15: qty 3

## 2023-07-15 MED ORDER — ATENOLOL 50 MG PO TABS
50.0000 mg | ORAL_TABLET | Freq: Every day | ORAL | Status: DC
  Administered 2023-07-16: 50 mg via ORAL
  Filled 2023-07-15: qty 1

## 2023-07-15 MED ORDER — IOHEXOL 300 MG/ML  SOLN
INTRAMUSCULAR | Status: DC | PRN
  Administered 2023-07-15: 175 mL

## 2023-07-15 MED ORDER — INFLUENZA VAC A&B SURF ANT ADJ 0.5 ML IM SUSY
0.5000 mL | PREFILLED_SYRINGE | INTRAMUSCULAR | Status: DC
  Filled 2023-07-15 (×2): qty 0.5

## 2023-07-15 MED ORDER — ROSUVASTATIN CALCIUM 20 MG PO TABS
40.0000 mg | ORAL_TABLET | Freq: Every day | ORAL | Status: DC
  Administered 2023-07-16: 40 mg via ORAL
  Filled 2023-07-15: qty 4
  Filled 2023-07-15: qty 2

## 2023-07-15 MED ORDER — SODIUM CHLORIDE 0.9 % WEIGHT BASED INFUSION
3.0000 mL/kg/h | INTRAVENOUS | Status: AC
  Administered 2023-07-15: 3 mL/kg/h via INTRAVENOUS

## 2023-07-15 MED ORDER — ATENOLOL 50 MG PO TABS
50.0000 mg | ORAL_TABLET | Freq: Every day | ORAL | Status: DC
Start: 2023-07-15 — End: 2023-07-15
  Filled 2023-07-15: qty 1

## 2023-07-15 MED ORDER — FENTANYL CITRATE (PF) 100 MCG/2ML IJ SOLN
INTRAMUSCULAR | Status: DC | PRN
  Administered 2023-07-15 (×2): 25 ug via INTRAVENOUS

## 2023-07-15 MED ORDER — SODIUM CHLORIDE 0.9 % WEIGHT BASED INFUSION
1.0000 mL/kg/h | INTRAVENOUS | Status: AC
Start: 2023-07-15 — End: 2023-07-15
  Administered 2023-07-15: 1 mL/kg/h via INTRAVENOUS

## 2023-07-15 MED ORDER — INSULIN GLARGINE-YFGN 100 UNIT/ML ~~LOC~~ SOLN
80.0000 [IU] | Freq: Every day | SUBCUTANEOUS | Status: DC
  Administered 2023-07-15: 80 [IU] via SUBCUTANEOUS
  Filled 2023-07-15: qty 0.8

## 2023-07-15 SURGICAL SUPPLY — 22 items
BALLN TREK RX 2.25X20 (BALLOONS) ×2
BALLN ~~LOC~~ EUPHORA RX 2.75X20 (BALLOONS) ×2
BALLOON TREK RX 2.25X20 (BALLOONS) ×1 IMPLANT
BALLOON ~~LOC~~ EUPHORA RX 2.75X20 (BALLOONS) ×1 IMPLANT
CATH INFINITI 5FR MULTPACK ANG (CATHETERS) ×1 IMPLANT
CATH VISTA GUIDE 6FR JR4 (CATHETERS) ×1 IMPLANT
DEVICE CLOSURE MYNXGRIP 6/7F (Vascular Products) ×1 IMPLANT
DRAPE BRACHIAL (DRAPES) IMPLANT
KIT ENCORE 26 ADVANTAGE (KITS) ×1 IMPLANT
KIT SYRINGE INJ CVI SPIKEX1 (MISCELLANEOUS) ×1 IMPLANT
NDL PERC 18GX7CM (NEEDLE) IMPLANT
NEEDLE PERC 18GX7CM (NEEDLE) ×2 IMPLANT
PACK CARDIAC CATH (CUSTOM PROCEDURE TRAY) ×2 IMPLANT
PROTECTION STATION PRESSURIZED (MISCELLANEOUS) ×2
SET ATX-X65L (MISCELLANEOUS) ×2 IMPLANT
SHEATH AVANTI 5FR X 11CM (SHEATH) ×1 IMPLANT
SHEATH AVANTI 6FR X 11CM (SHEATH) ×1 IMPLANT
STATION PROTECTION PRESSURIZED (MISCELLANEOUS) ×1 IMPLANT
STENT ONYX FRONTIER 2.5X34 (Permanent Stent) ×1 IMPLANT
TUBING CIL FLEX 10 FLL-RA (TUBING) ×1 IMPLANT
WIRE ASAHI PROWATER 180CM (WIRE) ×1 IMPLANT
WIRE GUIDERIGHT .035X150 (WIRE) ×1 IMPLANT

## 2023-07-15 NOTE — Plan of Care (Signed)
  Problem: Education: Goal: Understanding of CV disease, CV risk reduction, and recovery process will improve Outcome: Progressing   Problem: Activity: Goal: Ability to return to baseline activity level will improve Outcome: Progressing   Problem: Health Behavior/Discharge Planning: Goal: Ability to safely manage health-related needs after discharge will improve Outcome: Progressing   Problem: Education: Goal: Knowledge of General Education information will improve Description: Including pain rating scale, medication(s)/side effects and non-pharmacologic comfort measures Outcome: Progressing   Problem: Health Behavior/Discharge Planning: Goal: Ability to manage health-related needs will improve Outcome: Progressing   Problem: Nutrition: Goal: Adequate nutrition will be maintained Outcome: Progressing   Problem: Coping: Goal: Level of anxiety will decrease Outcome: Progressing

## 2023-07-16 ENCOUNTER — Encounter: Payer: Self-pay | Admitting: Cardiology

## 2023-07-16 DIAGNOSIS — Z951 Presence of aortocoronary bypass graft: Secondary | ICD-10-CM | POA: Diagnosis not present

## 2023-07-16 DIAGNOSIS — I2510 Atherosclerotic heart disease of native coronary artery without angina pectoris: Secondary | ICD-10-CM | POA: Diagnosis not present

## 2023-07-16 DIAGNOSIS — E119 Type 2 diabetes mellitus without complications: Secondary | ICD-10-CM | POA: Diagnosis not present

## 2023-07-16 DIAGNOSIS — Z7984 Long term (current) use of oral hypoglycemic drugs: Secondary | ICD-10-CM | POA: Diagnosis not present

## 2023-07-16 DIAGNOSIS — Z794 Long term (current) use of insulin: Secondary | ICD-10-CM | POA: Diagnosis not present

## 2023-07-16 DIAGNOSIS — I10 Essential (primary) hypertension: Secondary | ICD-10-CM | POA: Diagnosis not present

## 2023-07-16 DIAGNOSIS — Z79899 Other long term (current) drug therapy: Secondary | ICD-10-CM | POA: Diagnosis not present

## 2023-07-16 DIAGNOSIS — Z87891 Personal history of nicotine dependence: Secondary | ICD-10-CM | POA: Diagnosis not present

## 2023-07-16 DIAGNOSIS — Z7982 Long term (current) use of aspirin: Secondary | ICD-10-CM | POA: Diagnosis not present

## 2023-07-16 LAB — CBC
HCT: 40.6 % (ref 39.0–52.0)
Hemoglobin: 14.2 g/dL (ref 13.0–17.0)
MCH: 30.4 pg (ref 26.0–34.0)
MCHC: 35 g/dL (ref 30.0–36.0)
MCV: 86.9 fL (ref 80.0–100.0)
Platelets: 180 10*3/uL (ref 150–400)
RBC: 4.67 MIL/uL (ref 4.22–5.81)
RDW: 12.6 % (ref 11.5–15.5)
WBC: 5 10*3/uL (ref 4.0–10.5)
nRBC: 0 % (ref 0.0–0.2)

## 2023-07-16 LAB — BASIC METABOLIC PANEL
Anion gap: 9 (ref 5–15)
BUN: 25 mg/dL — ABNORMAL HIGH (ref 8–23)
CO2: 22 mmol/L (ref 22–32)
Calcium: 8.6 mg/dL — ABNORMAL LOW (ref 8.9–10.3)
Chloride: 103 mmol/L (ref 98–111)
Creatinine, Ser: 1.43 mg/dL — ABNORMAL HIGH (ref 0.61–1.24)
GFR, Estimated: 51 mL/min — ABNORMAL LOW (ref >60)
Glucose, Bld: 133 mg/dL — ABNORMAL HIGH (ref 70–99)
Potassium: 3.8 mmol/L (ref 3.5–5.1)
Sodium: 134 mmol/L — ABNORMAL LOW (ref 135–145)

## 2023-07-16 NOTE — Discharge Summary (Signed)
Discharge Summary      Patient ID: [name] MRN: [id number] DOB/AGE: March 16, 1951 72 y.o.  Admit date: 07/15/2023 Discharge date: 07/16/2023  Primary Discharge Diagnosis CAD with stent placement   Significant Diagnostic Studies: Heart catheterization  Hospital Course: 73 year old male with mildly reduced LVEF underwent elective heart cath yesterday with PCI of mid RCA.  He is doing well today without any complaints.  No chest pain or shortness of breath.  Plan for outpatient follow-up with consideration of staged PCI of left circumflex stenosis.  Stable to be discharged today with outpatient follow-up.  Continue dual antiplatelet therapy and rest of his medication.  Cardiology follow-up as outpatient.   Discharge Exam: Blood pressure (!) 159/87, pulse (!) 58, temperature (!) 97.5 F (36.4 C), resp. rate 16, height 6' (1.829 m), weight 85.3 kg, SpO2 98%.    General: well nourished, in no acute distress.  HEENT:  Normocephalic and atraumatic. Neck:   No JVD.  Lungs: Normal respiratory effort.  Clear to ascultation bilaterally. Heart: HRRR . Normal S1 and S2 without gallops or murmurs.  Groin site with no bleeding or hematoma  Labs:   Lab Results  Component Value Date   WBC 5.0 07/16/2023   HGB 14.2 07/16/2023   HCT 40.6 07/16/2023   MCV 86.9 07/16/2023   PLT 180 07/16/2023    Recent Labs  Lab 07/16/23 0221  NA 134*  K 3.8  CL 103  CO2 22  BUN 25*  CREATININE 1.43*  CALCIUM 8.6*  GLUCOSE 133*       FOLLOW UP PLANS AND APPOINTMENTS Discharge Instructions     AMB Referral to Cardiac Rehabilitation - Phase II   Complete by: As directed    Diagnosis: Coronary Stents   After initial evaluation and assessments completed: Virtual Based Care may be provided alone or in conjunction with Phase 2 Cardiac Rehab based on patient barriers.: Yes   Intensive Cardiac Rehabilitation (ICR) [facility] location only OR Traditional Cardiac Rehabilitation (TCR) *If criteria for  ICR are not met will enroll in TCR [facility] only): Yes      Allergies as of 07/16/2023       Reactions   Trulicity [dulaglutide] Nausea Only   Penicillins Rash   Childhood. Did not require medical care. Childhood. Did not require medical care. Childhood. Did not require medical care.        Medication List     STOP taking these medications    chlorpheniramine-HYDROcodone 10-8 MG/5ML Commonly known as: TUSSIONEX   oxyCODONE 5 MG immediate release tablet Commonly known as: Oxy IR/ROXICODONE       TAKE these medications    Accu-Chek Guide test strip Generic drug: glucose blood 1 each by Other route 2 times daily at 12 noon and 4 pm.   albuterol 108 (90 Base) MCG/ACT inhaler Commonly known as: VENTOLIN HFA Inhale 2 puffs into the lungs every 6 (six) hours as needed.   amitriptyline 10 MG tablet Commonly known as: ELAVIL Take 4 tablets (40 mg total) by mouth at bedtime.   aspirin EC 81 MG tablet Take 81 mg by mouth daily.   atenolol 50 MG tablet Commonly known as: TENORMIN TAKE 1 TABLET EVERY DAY   clopidogrel 75 MG tablet Commonly known as: PLAVIX TAKE 1 TABLET EVERY DAY   cyanocobalamin 1000 MCG tablet Commonly known as: VITAMIN B12 Take 1,000 mcg by mouth daily.   fenofibrate 145 MG tablet Commonly known as: TRICOR TAKE 1 TABLET EVERY DAY   glipiZIDE 10  MG 24 hr tablet Commonly known as: GLUCOTROL XL Take 10 mg by mouth daily.   metFORMIN 500 MG tablet Commonly known as: GLUCOPHAGE TAKE 2 TABLETS TWICE A DAY WITH MEALS   neomycin-polymyxin b-dexamethasone 3.5-10000-0.1 Susp Commonly known as: MAXITROL PLACE 1 DROP INTO THE LEFT EYE 4 (FOUR) TIMES DAILY.   nitroGLYCERIN 0.4 MG SL tablet Commonly known as: NITROSTAT Place 0.4 mg under the tongue every 5 (five) minutes as needed.   omega-3 acid ethyl esters 1 g capsule Commonly known as: LOVAZA Take 2 capsules (2 g total) by mouth 2 (two) times daily.   pantoprazole 20 MG tablet Commonly  known as: PROTONIX TAKE 1 TABLET EVERY DAY (DISCONTINUE 40MG  DOSE)   ramipril 1.25 MG capsule Commonly known as: ALTACE TAKE 1 CAPSULE EVERY DAY   rosuvastatin 40 MG tablet Commonly known as: CRESTOR Take 1 tablet (40 mg total) by mouth daily.   Evaristo Bury FlexTouch 100 UNIT/ML FlexTouch Pen Generic drug: insulin degludec Inject 80 Units into the skin at bedtime.        Follow-up Information     Custovic, Rozell Searing, DO. Go in 1 week(s).   Specialty: Cardiology Contact information: [address] [place] [address] [phone]                 PLEASE BRING ALL MEDICATIONS WITH YOU TO FOLLOW UP APPOINTMENTS  Signed:  Windell [place] 07/16/2023, 11:52 AM [facility] Cardiology

## 2023-07-18 ENCOUNTER — Emergency Department
Admission: EM | Admit: 2023-07-18 | Discharge: 2023-07-18 | Discharge status: Against Medical Advice | Payer: Medicare PPO | Attending: Emergency Medicine | Admitting: Emergency Medicine

## 2023-07-18 ENCOUNTER — Emergency Department: Payer: Medicare PPO

## 2023-07-18 ENCOUNTER — Other Ambulatory Visit: Payer: Self-pay

## 2023-07-18 DIAGNOSIS — Z794 Long term (current) use of insulin: Secondary | ICD-10-CM | POA: Diagnosis not present

## 2023-07-18 DIAGNOSIS — E785 Hyperlipidemia, unspecified: Secondary | ICD-10-CM | POA: Diagnosis not present

## 2023-07-18 DIAGNOSIS — Z5321 Procedure and treatment not carried out due to patient leaving prior to being seen by health care provider: Secondary | ICD-10-CM | POA: Insufficient documentation

## 2023-07-18 DIAGNOSIS — I517 Cardiomegaly: Secondary | ICD-10-CM | POA: Diagnosis not present

## 2023-07-18 DIAGNOSIS — I214 Non-ST elevation (NSTEMI) myocardial infarction: Secondary | ICD-10-CM | POA: Diagnosis not present

## 2023-07-18 DIAGNOSIS — E875 Hyperkalemia: Secondary | ICD-10-CM | POA: Diagnosis not present

## 2023-07-18 DIAGNOSIS — I701 Atherosclerosis of renal artery: Secondary | ICD-10-CM | POA: Diagnosis not present

## 2023-07-18 DIAGNOSIS — E1165 Type 2 diabetes mellitus with hyperglycemia: Secondary | ICD-10-CM | POA: Diagnosis not present

## 2023-07-18 DIAGNOSIS — R0789 Other chest pain: Secondary | ICD-10-CM | POA: Diagnosis not present

## 2023-07-18 DIAGNOSIS — I21A9 Other myocardial infarction type: Secondary | ICD-10-CM | POA: Diagnosis not present

## 2023-07-18 DIAGNOSIS — R079 Chest pain, unspecified: Secondary | ICD-10-CM | POA: Insufficient documentation

## 2023-07-18 DIAGNOSIS — Z951 Presence of aortocoronary bypass graft: Secondary | ICD-10-CM | POA: Insufficient documentation

## 2023-07-18 DIAGNOSIS — I700 Atherosclerosis of aorta: Secondary | ICD-10-CM | POA: Diagnosis not present

## 2023-07-18 DIAGNOSIS — N189 Chronic kidney disease, unspecified: Secondary | ICD-10-CM | POA: Diagnosis not present

## 2023-07-18 DIAGNOSIS — T82867A Thrombosis of cardiac prosthetic devices, implants and grafts, initial encounter: Secondary | ICD-10-CM | POA: Diagnosis not present

## 2023-07-18 DIAGNOSIS — N179 Acute kidney failure, unspecified: Secondary | ICD-10-CM | POA: Diagnosis not present

## 2023-07-18 DIAGNOSIS — R944 Abnormal results of kidney function studies: Secondary | ICD-10-CM | POA: Diagnosis not present

## 2023-07-18 DIAGNOSIS — I10 Essential (primary) hypertension: Secondary | ICD-10-CM | POA: Diagnosis not present

## 2023-07-18 DIAGNOSIS — I2510 Atherosclerotic heart disease of native coronary artery without angina pectoris: Secondary | ICD-10-CM | POA: Diagnosis not present

## 2023-07-18 DIAGNOSIS — T82857A Stenosis of cardiac prosthetic devices, implants and grafts, initial encounter: Secondary | ICD-10-CM | POA: Diagnosis not present

## 2023-07-18 DIAGNOSIS — I129 Hypertensive chronic kidney disease with stage 1 through stage 4 chronic kidney disease, or unspecified chronic kidney disease: Secondary | ICD-10-CM | POA: Diagnosis not present

## 2023-07-18 DIAGNOSIS — E1122 Type 2 diabetes mellitus with diabetic chronic kidney disease: Secondary | ICD-10-CM | POA: Diagnosis not present

## 2023-07-18 DIAGNOSIS — I252 Old myocardial infarction: Secondary | ICD-10-CM | POA: Insufficient documentation

## 2023-07-18 LAB — CBC
HCT: 51.8 % (ref 39.0–52.0)
Hemoglobin: 18 g/dL — ABNORMAL HIGH (ref 13.0–17.0)
MCH: 31.2 pg (ref 26.0–34.0)
MCHC: 34.7 g/dL (ref 30.0–36.0)
MCV: 89.8 fL (ref 80.0–100.0)
Platelets: 263 10*3/uL (ref 150–400)
RBC: 5.77 MIL/uL (ref 4.22–5.81)
RDW: 12.8 % (ref 11.5–15.5)
WBC: 6.2 10*3/uL (ref 4.0–10.5)
nRBC: 0 % (ref 0.0–0.2)

## 2023-07-18 LAB — BASIC METABOLIC PANEL
Anion gap: 9 (ref 5–15)
BUN: 26 mg/dL — ABNORMAL HIGH (ref 8–23)
CO2: 24 mmol/L (ref 22–32)
Calcium: 10.2 mg/dL (ref 8.9–10.3)
Chloride: 107 mmol/L (ref 98–111)
Creatinine, Ser: 1.88 mg/dL — ABNORMAL HIGH (ref 0.61–1.24)
GFR, Estimated: 37 mL/min — ABNORMAL LOW (ref >60)
Glucose, Bld: 224 mg/dL — ABNORMAL HIGH (ref 70–99)
Potassium: 4.8 mmol/L (ref 3.5–5.1)
Sodium: 140 mmol/L (ref 135–145)

## 2023-07-18 LAB — TROPONIN I (HIGH SENSITIVITY): Troponin I (High Sensitivity): 19 ng/L — ABNORMAL HIGH (ref <18)

## 2023-07-18 LAB — LIPOPROTEIN A (LPA): Lipoprotein (a): 16.3 nmol/L (ref <75.0)

## 2023-07-18 NOTE — Patient Instructions (Signed)
Be Involved in Caring For Your Health:  Taking Medications When medications are taken as directed, they can greatly improve your health. But if they are not taken as prescribed, they may not work. In some cases, not taking them correctly can be harmful. To help ensure your treatment remains effective and safe, understand your medications and how to take them. Bring your medications to each visit for review by your provider.  Your lab results, notes, and after visit summary will be available on My Chart. We strongly encourage you to use this feature. If lab results are abnormal the clinic will contact you with the appropriate steps. If the clinic does not contact you assume the results are satisfactory. You can always view your results on My Chart. If you have questions regarding your health or results, please contact the clinic during office hours. You can also ask questions on My Chart.  We at [facility] are grateful that you chose [place] to provide your care. We strive to provide evidence-based and compassionate care and are always looking for feedback. If you get a survey from the clinic please complete this so we can hear your opinions.  Diabetes Mellitus and Exercise Regular exercise is important for your health, especially if you have diabetes mellitus. Exercise is not just about losing weight. It can also help you increase muscle strength and bone density and reduce body fat and stress. This can help your level of endurance and make you more fit and flexible. Why should I exercise if I have diabetes? Exercise has many benefits for people with diabetes. It can: Help lower and control your blood sugar (glucose). Help your body respond better and become more sensitive to the hormone insulin. Reduce how much insulin your body needs. Lower your risk for heart disease by: Lowering how much "bad" cholesterol and triglycerides you have in your body. Increasing how much "good" cholesterol  you have in your body. Lowering your blood pressure. Lowering your blood glucose levels. What is my activity plan? Your health care provider or an expert trained in diabetes care (certified diabetes educator) can help you make an activity plan. This plan can help you find the type of exercise that works for you. It may also tell you how often to exercise and for how long. Be sure to: Get at least 150 minutes of medium-intensity or high-intensity exercise each week. This may involve brisk walking, biking, or water aerobics. Do stretching and strengthening exercises at least 2 times a week. This may involve yoga or weight lifting. Spread out your activity over at least 3 days of the week. Get some form of physical activity each day. Do not go more than 2 days in a row without some kind of activity. Avoid being inactive for more than 30 minutes at a time. Take frequent breaks to walk or stretch. Choose activities that you enjoy. Set goals that you know you can accomplish. Start slowly and increase the intensity of your exercise over time. How do I manage my diabetes during exercise?  Monitor your blood glucose Check your blood glucose before and after you exercise. If your blood glucose is 240 mg/dL (40.9 mmol/L) or higher before you exercise, check your urine for ketones. These are chemicals created by the liver. If you have ketones in your urine, do not exercise until your blood glucose returns to normal. If your blood glucose is 100 mg/dL (5.6 mmol/L) or lower, eat a snack that has 15-20 grams of carbohydrate in  it. Check your blood glucose 15 minutes after the snack to make sure that your level is above 100 mg/dL (5.6 mmol/L) before you start to exercise. Your risk for low blood glucose (hypoglycemia) goes up during and after exercise. Know the symptoms of this condition and how to treat it. Follow these instructions at home: Keep a carbohydrate snack on hand for use before, during, and after  exercise. This can help prevent or treat hypoglycemia. Avoid injecting insulin into parts of your body that are going to be used during exercise. This may include: Your arms, when you are going to play tennis. Your legs, when you are about to go jogging. Keep track of your exercise habits. This can help you and your health care provider watch and adjust your activity plan. Write down: What you eat before and after you exercise. Blood glucose levels before and after you exercise. The type and amount of exercise you do. Talk to your health care provider before you start a new activity. They may need to: Make sure that the activity is safe for you. Adjust your insulin, other medicines, and food that you eat. Drink water while you exercise. This can stop you from losing too much water (dehydration). It can also prevent problems caused by having a lot of heat in your body (heat stroke). Where to find more information American Diabetes Association: diabetes.org Association of Diabetes Care & Education Specialists: diabeteseducator.org This information is not intended to replace advice given to you by your health care provider. Make sure you discuss any questions you have with your health care provider. Document Revised: 02/18/2022 Document Reviewed: 02/18/2022 Elsevier Patient Education  2024 [employer].

## 2023-07-18 NOTE — ED Notes (Signed)
Family brought wheelchair back and slammed it into the doorway of the ED entrance. Family stormed off.

## 2023-07-18 NOTE — ED Notes (Signed)
Pt seen being wheeled out of ED lobby at this time by male visitor/family/SO. First nurse notified.

## 2023-07-18 NOTE — ED Notes (Signed)
Family member states pt is still hurting and wants pain medicine. Family informed that we can't give any pain medicine. Informed family pt would hopefully get room soon but we are just waiting. Family stormed off and was cussing.

## 2023-07-18 NOTE — ED Triage Notes (Signed)
Pt states coming in with chest pain that started at noon and radiates down his left arm and to his jaw. Pt states he had a stent placed this past week, but has 2 more known blockages. Pt reports taking 81mg  ASA today and 1 nitroglycerin without relief.

## 2023-07-22 ENCOUNTER — Other Ambulatory Visit: Payer: Self-pay | Admitting: Nurse Practitioner

## 2023-07-22 ENCOUNTER — Telehealth: Payer: Self-pay

## 2023-07-22 NOTE — Telephone Encounter (Signed)
Requested Prescriptions  Pending Prescriptions Disp Refills   omega-3 acid ethyl esters (LOVAZA) 1 g capsule [Pharmacy Med Name: Omega-3-acid Ethyl Esters Oral Capsule 1 GM] 360 capsule 0    Sig: TAKE 2 CAPSULES (2 G TOTAL) BY MOUTH 2 (TWO) TIMES DAILY.     Endocrinology:  Nutritional Agents - omega-3 acid ethyl esters Failed - 07/22/2023  2:23 AM      Failed - Lipid Panel in normal range within the last 12 months    Cholesterol, Total  Date Value Ref Range Status  04/02/2023 133 100 - 199 mg/dL Final   LDL Chol Calc (NIH)  Date Value Ref Range Status  04/02/2023 75 0 - 99 mg/dL Final   HDL  Date Value Ref Range Status  04/02/2023 27 (L) >39 mg/dL Final   Triglycerides  Date Value Ref Range Status  04/02/2023 184 (H) 0 - 149 mg/dL Final         Passed - Valid encounter within last 12 months    Recent Outpatient Visits           3 months ago Type 2 diabetes mellitus with proteinuria (HCC)   [place] [facility] [place], [place] T, NP   5 months ago Left nephrolithiasis   [place] [facility] Mec[place], Oswaldo Conroy, PA-C   7 months ago Community acquired pneumonia of right lower lobe of lung   [place] [facility] [place], [place] T, NP   8 months ago AKI (acute kidney injury) (HCC)   [place] [facility] [place], Corrie Dandy T, NP   8 months ago Sinus congestion   [place] [facility] [place], Dorie Rank, NP       Future Appointments             [place] [employer], Dorie Rank, NP [place] [name]             amitriptyline (ELAVIL) 10 MG tablet [Pharmacy Med Name: Amitriptyline HCl Oral Tablet 10 MG] 360 tablet 0    Sig: TAKE 4 TABLETS AT BEDTIME     Psychiatry:  Antidepressants - Heterocyclics (TCAs) Passed - 07/22/2023  2:23 AM      Passed - Valid encounter within last 6 months    Recent Outpatient Visits           3 months ago Type 2 diabetes mellitus with proteinuria (HCC)    [place] [facility] [place], [place] T, NP   5 months ago Left nephrolithiasis   [place] [facility] Mecum, Oswaldo Conroy, PA-C   7 months ago Community acquired pneumonia of right lower lobe of lung   [place] [facility] [place], [place] T, NP   8 months ago AKI (acute kidney injury) (HCC)   [place] [facility] [place], Corrie Dandy T, NP   8 months ago Sinus congestion   [place] [facility] [place], Dorie Rank, NP       Future Appointments             [place], Dorie Rank, NP  [name]             rosuvastatin (CRESTOR) 40 MG tablet [Pharmacy Med Name: Rosuvastatin Calcium Oral Tablet 40 MG] 90 tablet 0    Sig: TAKE 1 TABLET EVERY DAY     Cardiovascular:  Antilipid - Statins 2 Failed - 07/22/2023  2:23 AM      Failed -  Cr in normal range and within 360 days    Creatinine  Date Value Ref Range Status  09/20/2014 1.10 0.60 - 1.30 mg/dL Final   Creatinine, Ser  Date Value Ref Range Status  07/18/2023 1.88 (H) 0.61 - 1.24 mg/dL Final         Failed - Lipid Panel in normal range within the last 12 months    Cholesterol, Total  Date Value Ref Range Status  04/02/2023 133 100 - 199 mg/dL Final   LDL Chol Calc (NIH)  Date Value Ref Range Status  04/02/2023 75 0 - 99 mg/dL Final   HDL  Date Value Ref Range Status  04/02/2023 27 (L) >39 mg/dL Final   Triglycerides  Date Value Ref Range Status  04/02/2023 184 (H) 0 - 149 mg/dL Final         Passed - Patient is not pregnant      Passed - Valid encounter within last 12 months    Recent Outpatient Visits           3 months ago Type 2 diabetes mellitus with proteinuria (HCC)   [place] [facility] [place], [place] T, NP   5 months ago Left nephrolithiasis   [place] [facility] Mecum, Oswaldo Conroy, PA-C   7 months ago Community acquired pneumonia of right lower lobe of lung   [facility] [place], [place] T, NP   8 months ago AKI (acute kidney injury) (HCC)   [place] [facility] [place], Corrie Dandy T, NP   8 months ago Sinus congestion   [place] [facility] [place], Dorie Rank, NP       Future Appointments             [place], Dorie Rank, NP [place] [name]

## 2023-07-22 NOTE — Transitions of Care (Post Inpatient/ED Visit) (Signed)
   07/22/2023  Name: Aran Menning MRN: [id number] DOB: 03/29/1951  Today's TOC FU Call Status: Today's TOC FU Call Status:: Unsuccessful Call (1st Attempt) Unsuccessful Call (1st Attempt) Date: 07/22/23  Attempted to reach the patient regarding the most recent Inpatient/ED visit.  Follow Up Plan: Additional outreach attempts will be made to reach the patient to complete the Transitions of Care (Post Inpatient/ED visit) call.   Susa Loffler , BSN, RN Care Management Coordinator [place]   [facility] [email or web address] Direct Dial: [phone]

## 2023-07-23 ENCOUNTER — Encounter: Payer: Self-pay | Admitting: Nurse Practitioner

## 2023-07-23 ENCOUNTER — Telehealth: Payer: Self-pay

## 2023-07-23 ENCOUNTER — Ambulatory Visit (INDEPENDENT_AMBULATORY_CARE_PROVIDER_SITE_OTHER): Payer: Medicare PPO | Admitting: Nurse Practitioner

## 2023-07-23 VITALS — BP 102/59 | HR 59 | Temp 97.9°F | Ht 72.0 in | Wt 180.8 lb

## 2023-07-23 DIAGNOSIS — I252 Old myocardial infarction: Secondary | ICD-10-CM | POA: Diagnosis not present

## 2023-07-23 DIAGNOSIS — E785 Hyperlipidemia, unspecified: Secondary | ICD-10-CM | POA: Diagnosis not present

## 2023-07-23 DIAGNOSIS — E1129 Type 2 diabetes mellitus with other diabetic kidney complication: Secondary | ICD-10-CM | POA: Diagnosis not present

## 2023-07-23 DIAGNOSIS — E1169 Type 2 diabetes mellitus with other specified complication: Secondary | ICD-10-CM | POA: Diagnosis not present

## 2023-07-23 DIAGNOSIS — E1165 Type 2 diabetes mellitus with hyperglycemia: Secondary | ICD-10-CM

## 2023-07-23 DIAGNOSIS — I152 Hypertension secondary to endocrine disorders: Secondary | ICD-10-CM

## 2023-07-23 DIAGNOSIS — E781 Pure hyperglyceridemia: Secondary | ICD-10-CM | POA: Diagnosis not present

## 2023-07-23 DIAGNOSIS — E1159 Type 2 diabetes mellitus with other circulatory complications: Secondary | ICD-10-CM | POA: Diagnosis not present

## 2023-07-23 DIAGNOSIS — N183 Chronic kidney disease, stage 3 unspecified: Secondary | ICD-10-CM | POA: Diagnosis not present

## 2023-07-23 DIAGNOSIS — I25118 Atherosclerotic heart disease of native coronary artery with other forms of angina pectoris: Secondary | ICD-10-CM

## 2023-07-23 DIAGNOSIS — K219 Gastro-esophageal reflux disease without esophagitis: Secondary | ICD-10-CM | POA: Diagnosis not present

## 2023-07-23 DIAGNOSIS — R809 Proteinuria, unspecified: Secondary | ICD-10-CM | POA: Diagnosis not present

## 2023-07-23 DIAGNOSIS — Z0000 Encounter for general adult medical examination without abnormal findings: Secondary | ICD-10-CM

## 2023-07-23 DIAGNOSIS — Z23 Encounter for immunization: Secondary | ICD-10-CM

## 2023-07-23 DIAGNOSIS — N400 Enlarged prostate without lower urinary tract symptoms: Secondary | ICD-10-CM

## 2023-07-23 DIAGNOSIS — Z794 Long term (current) use of insulin: Secondary | ICD-10-CM

## 2023-07-23 DIAGNOSIS — E1122 Type 2 diabetes mellitus with diabetic chronic kidney disease: Secondary | ICD-10-CM | POA: Diagnosis not present

## 2023-07-23 DIAGNOSIS — F5101 Primary insomnia: Secondary | ICD-10-CM

## 2023-07-23 LAB — BAYER DCA HB A1C WAIVED: HB A1C (BAYER DCA - WAIVED): 9.3 % — ABNORMAL HIGH (ref 4.8–5.6)

## 2023-07-23 MED ORDER — FREESTYLE LIBRE 3 READER DEVI: 0 refills | Status: DC

## 2023-07-23 MED ORDER — FREESTYLE LIBRE 3 SENSOR MISC: 12 refills | Status: AC

## 2023-07-23 NOTE — Assessment & Plan Note (Signed)
Refer to CKD plan of care. 

## 2023-07-23 NOTE — Assessment & Plan Note (Signed)
Chronic, stable at present.  Continue current medication regimen and collaboration with cardiology.

## 2023-07-23 NOTE — Assessment & Plan Note (Signed)
Chronic, ongoing.   Belsomra too costly.  Would avoid Ambien and benzo use due to age and BEERS.  Continue Amtriptyline 40 MG at night which has offered benefit to him. Continue to monitor and adjust regimen as needed.  Recommend sleep hygiene techniques.  Could consider therapy for CBT in future.

## 2023-07-23 NOTE — Assessment & Plan Note (Addendum)
Acute and improved at this time. Continue current medication regimen as ordered by cardiology.  He is schedule to follow-up with [facility] cardiology and plans to continue with them.  To stay on ASA indefinitely and Prasugel for 12 months (start 07/18/23).

## 2023-07-23 NOTE — Assessment & Plan Note (Signed)
Chronic, ongoing CKD 3a to 3 b on checks.  Followed by endocrinology with A1c 9.3% today (trend down) and urine ALB 150 (July 2024) -- continue Ramipril for kidney protection.  Continue current medication regimen as prescribed by endocrinology, he plans to reschedule with them and suspect they may adjust - his sugars have been lower since returning home, notes reviewed.  PharmD referral in to assist with monitor and perhaps and SGLT2. Recommend he check BS at least twice a day, fasting and 2 hours after a meal.   - Foot and eye exams up to date - Vaccinations up to date - ACE and Statin on board.

## 2023-07-23 NOTE — Transitions of Care (Post Inpatient/ED Visit) (Signed)
   07/23/2023  Name: [name] MRN: [id number] DOB: September 29, 1950  Today's TOC FU Call Status: Today's TOC FU Call Status:: Unsuccessful Call (2nd Attempt) Unsuccessful Call (1st Attempt) Date: 07/22/23 Unsuccessful Call (2nd Attempt) Date: 07/23/23  Attempted to reach the patient regarding the most recent Inpatient/ED visit.  Follow Up Plan: Additional outreach attempts will be made to reach the patient to complete the Transitions of Care (Post Inpatient/ED visit) call.   Susa Loffler , BSN, RN Care Management Coordinator [place]   [facility] [email or web address] Direct Dial: [phone]

## 2023-07-23 NOTE — Assessment & Plan Note (Signed)
Chronic, ongoing.  Continue Atorvastatin, Lovaza, and Fenofibrate, tolerating at this time.  Adjust dose as needed.  Lipid panel today.  Insurance would not cover Repatha.

## 2023-07-23 NOTE — Assessment & Plan Note (Addendum)
Ongoing issue with statin, Fenofibrate, and Fish Oil on board, will recheck Lipid panel today and continue collaboration with cardiology.  Attempted to get Repatha in past but insurance denied this.

## 2023-07-23 NOTE — Progress Notes (Signed)
BP (!) 102/59   Pulse (!) 59   Temp 97.9 F (36.6 C) (Oral)   Ht 6' (1.829 m)   Wt 180 lb 12.8 oz (82 kg)   SpO2 98%   BMI 24.52 kg/m    Subjective:    Patient ID: [name], male    DOB: 1951-01-14, 72 y.o.   MRN: [id number]  HPI: [name] is a 72 y.o. male  Chief Complaint  Patient presents with   Chronic Kidney Disease   Diabetes   Hyperlipidemia   Hypertension   Hospitalization Follow-up   Transition of Care Hospital Follow up.  Had a left heart cath with stents by Dr. Cassie Freer on 07/15/23.  Was doing well until 07/18/23 when he started having chest pain.  Went to ER [facility] and noted to have an NSTEMI.  They took stent out and removed blockage, then placed new stent -- he was noted to have "100% thrombosis of proximal RCA stent".  He is to continue ASA indefinitely and they stopped Plavix and placed on Prasugrel which he is to continue for 12 months.  They stopped Rosuvastatin and placed on Atorvastatin 80 MG.  They changed to [place] to 65 units, he was taking 75 units.  Ramipril changed to 2.5 MG.  Follows with endocrinology with last visit on 01/21/23, missed recent visit due to illness and is going to reschedule.  Last A1c in July was 8.8%.    "Hospital Course:  Mr. Valade is a 72 y.o. male with CAD s/p CABG, T2DM, CKD, hyperlipidemia, hypertension, who presented with chest pain after having an RCA stent placed 3 days prior, found to have stent thrombosis on LHC, now post PCI. S/p sheath removal 11/4, plan to monitor sheath site for c/f hematoma, and trend troponins to peak. Started on DAPT with ASA+Effient, was previously on Plavix.  NSTE-ACS 2/2 stent thrombosis  CAD  History of CABG Patient presented to [facility] ED on 07/18/23 with worsening chest pain after having a mid RCA stent placement 3 days ago on 10/31 at [facility] with plan for follow-up staged PCI of the left circumflex. Initial troponin 318, rose to 1546. Concern for NSTEMI  with EKG with dynamic changes. CTA without evidence of dissection. He was given aspirin 325 mg and started on a heparin and nitroglycerin gtt, and transferred to [facility] for LHC. LHC found 100% thrombosis of proximal RCA stent, which was aspirated and dilated with good result, 100% chronically occluded mid LAD, 50% prox LCx lesion and a complex 80% bifurcation mid LCx stenosis. Due to renal insufficiency, known occluded SVG-OM or the LIMA to LAD which was patent 4 days ago was not injected. Sheath removed this AM after ACT <140. Transitioned from Plavix to Effient, and obtaining Plavix genotyping. TTE 11/4 noted LVEF 55 to 60%, with dyskinesis involving apical anterior/septal segments. Midly reduced RV systolic function. S/p aspirin 325 mg x1, continue aspirin 81 daily indefinitely. S/p prasugrel 60 mg, continue prasugrel 10 mg daily for at least 12 months Continue atorvastatin 80 mg daily in place of home rosuvastatin 40 mg daily. Cardiac rehab referral on discharge, he will follow up with cardiology, Dr Hiram Gash group.   Follow up To-Dos: [ ]  Plavix genotyping pending [ ]  Consider transitioning prasugrel to [facility] Jan 2025 d/t price  Hypertension During admission, ACEi was held due to elevated Cr. He was switched from home Atenolol 50 mg to Coreg 12.5 mg BID. Amlodipine was initiated 5mg  BID. Blood pressure remained above  goal, hydralazine 25 mg TID was added, blood pressure improved goal systolic <130. Plan to restart ramipril at increased dose at home, continue atenolol and amlodipine. Follow up primary care for medication management.  Hyperlipidemia Direct LDL > goal 60 during admission, home atorvastatin 80mg  and fenofibrate 145 mg daily - Continue home Lovaza 2 g twice daily  C/f femoral sheath hematoma Noted to have oozing from sheath site post catheterization lasting approximately 40 minutes. Now resolved.  Elevated Cr  CKD Cr 1.83 on admission, baseline Cr ~1.29 on 04/22/23. Cr improved  to 1.41 at present. Held home ACEi, downtrended appropriately. Will follow up with PCP 11/8  Hyperkalemia K elevated at 5.6 after LHC. Lokelma x1 without repeat elevated K.  T2DM Glucose elevated throughout admission, suspect likely d/t acute stress state. With PO improvement, gradually increased long-acting regimen. Last on Lantus 60U nightly and correctional Lispro. Restart home Tresiba 65 units nightly, metformin 1000 mg twice daily, home glipizide 10 mg daily. A1c 8.5. Follow up with primary care provider for further outpatient adjustment. [ ]  Recommend initiation of SGLT2 dependent on cost after Jan 2025.   GERD Continue home pantoprazole 20 mg daily  Insomnia Continue home amitriptyline 40 mg nightly"  Hospital/Facility: [facility] D/C Physician: Dr. Jon Billings D/C Date: 07/21/23  Records Requested: 07/23/23 Records Received: 07/23/23 Records Reviewed: 07/23/23  Diagnoses on Discharge: NSTEMI  Date of interactive Contact within 48 hours of discharge:  Contact was through: phone  Date of 7 day or 14 day face-to-face visit:    within 7 days  Outpatient Encounter Medications as of 07/23/2023  Medication Sig   ACCU-CHEK GUIDE test strip 1 each by Other route 2 times daily at 12 noon and 4 pm.   albuterol (VENTOLIN HFA) 108 (90 Base) MCG/ACT inhaler Inhale 2 puffs into the lungs every 6 (six) hours as needed.   amitriptyline (ELAVIL) 10 MG tablet TAKE 4 TABLETS AT BEDTIME   amLODipine (NORVASC) 5 MG tablet Take 1 tablet by mouth daily.   aspirin EC 81 MG tablet Take 81 mg by mouth daily.    atenolol (TENORMIN) 50 MG tablet TAKE 1 TABLET EVERY DAY   atorvastatin (LIPITOR) 80 MG tablet Take 80 mg by mouth daily.   Continuous Glucose Receiver (FREESTYLE LIBRE 3 READER) DEVI Is on insulin to check blood sugar regularly with Freestyle. E11.65, Z79.4   Continuous Glucose Sensor (FREESTYLE LIBRE 3 SENSOR) MISC To check sugars regularly is on insulin Dx code E11.65, Z79.4   ezetimibe (ZETIA) 10 MG  tablet Take 10 mg by mouth daily.   fenofibrate (TRICOR) 145 MG tablet TAKE 1 TABLET EVERY DAY   glipiZIDE (GLUCOTROL XL) 10 MG 24 hr tablet Take 10 mg by mouth daily.   insulin degludec (TRESIBA FLEXTOUCH) 100 UNIT/ML FlexTouch Pen Inject 75 Units into the skin at bedtime.   metFORMIN (GLUCOPHAGE) 500 MG tablet TAKE 2 TABLETS TWICE A DAY WITH MEALS   neomycin-polymyxin b-dexamethasone (MAXITROL) 3.5-10000-0.1 SUSP PLACE 1 DROP INTO THE LEFT EYE 4 (FOUR) TIMES DAILY.   nitroGLYCERIN (NITROSTAT) 0.4 MG SL tablet Place 0.4 mg under the tongue every 5 (five) minutes as needed.   omega-3 acid ethyl esters (LOVAZA) 1 g capsule TAKE 2 CAPSULES (2 G TOTAL) BY MOUTH 2 (TWO) TIMES DAILY.   pantoprazole (PROTONIX) 20 MG tablet TAKE 1 TABLET EVERY DAY (DISCONTINUE 40MG  DOSE)   prasugrel (EFFIENT) 10 MG TABS tablet Take 1 tablet by mouth daily.   ramipril (ALTACE) 2.5 MG capsule Take 2.5 mg by mouth daily.  vitamin B-12 (CYANOCOBALAMIN) 1000 MCG tablet Take 1,000 mcg by mouth daily.   [DISCONTINUED] rosuvastatin (CRESTOR) 40 MG tablet TAKE 1 TABLET EVERY DAY   [DISCONTINUED] amitriptyline (ELAVIL) 10 MG tablet Take 4 tablets (40 mg total) by mouth at bedtime. (Patient not taking: Reported on 07/15/2023)   [DISCONTINUED] clopidogrel (PLAVIX) 75 MG tablet TAKE 1 TABLET EVERY DAY (Patient not taking: Reported on 07/23/2023)   [DISCONTINUED] omega-3 acid ethyl esters (LOVAZA) 1 g capsule Take 2 capsules (2 g total) by mouth 2 (two) times daily.   [DISCONTINUED] ramipril (ALTACE) 1.25 MG capsule TAKE 1 CAPSULE EVERY DAY   [DISCONTINUED] rosuvastatin (CRESTOR) 40 MG tablet Take 1 tablet (40 mg total) by mouth daily.   No facility-administered encounter medications on file as of 07/23/2023.    Diagnostic Tests Reviewed/Disposition: Reviewed all recent labs in chart  Consults: Cardiology  Discharge Instructions: Follow-up with cardiology  Disease/illness Education: Reviewed with patient and discussed recent  admission  Home Health/Community Services Discussions/Referrals: none  Establishment or re-establishment of referral orders for community resources: none  Discussion with other health care providers: Reviewed all recent notes in chart  Assessment and Support of treatment regimen adherence: Reviewed with patient   Appointments Coordinated with: Reviewed with patient   Education for self-management, independent living, and ADLs: Reviewed with patient   DIABETES Follows with endocrinology with last visit on 01/21/23, missed recent visit due to illness and is going to reschedule.  Last A1c in July was 8.8%.  Was diagnosed >10 years ago.  Tried Jardiance in past, but was too costly, plus tried Trulicity and this kept him nauseous. Currently taking Tresiba, Glipizide, and Metformin.   History of low B12 levels, is taking supplement daily. Hypoglycemic episodes:no Polydipsia/polyuria: no Visual disturbance: no Chest pain: no Paresthesias: no Glucose Monitoring: yes             Accucheck frequency: Daily             Fasting glucose: 100 this morning             Post prandial:             Evening:             Before meals: Taking Insulin?: yes             Long acting insulin: Tresiba 65 units             Short acting insulin: Blood Pressure Monitoring: not checking Retinal Examination: Up To Date --  Eye Foot Exam: Up to Date Pneumovax: Up to Date Influenza: Up to Date Aspirin: yes    GERD Continues Protonix, magnesium levels monitored with this due to lows. GERD control status: stable Satisfied with current treatment? yes Heartburn frequency: occasional Medication side effects: no  Medication compliance: stable Previous GERD medications: Antacid use frequency:  occasional use Dysphagia: no Odynophagia:  no Hematemesis: no Blood in stool: no EGD: no   CHRONIC KIDNEY DISEASE Recent kidney labs stable. CKD status: stable   Medications renally dose: yes Previous  renal evaluation: no Pneumovax:  Up to Date Influenza Vaccine:  Up to Date    HYPERTENSION / HYPERLIPIDEMIA Taking Ramipril 1.25 MG, Atenolol 50 MG, ASA, Fenofibrate, Atorvastatin, Zetia, Lovaza, and Plavix.  Last saw cardiology on 06/08/23 with left heart cath on 07/15/23.  Quit smoking in 1998, smoked about 2 PPD.  History of CABG in 1998, stent in 2006, catheterization in 2011. Attempted to get Repatha covered, but insurance denied.   Last  echo in 2019 and EF 55% .  Has not had to use NTG recently. Satisfied with current treatment? yes Duration of hypertension: chronic BP monitoring frequency: not checking BP range:  BP medication side effects: no Duration of hyperlipidemia: chronic Cholesterol medication side effects: no Cholesterol supplements: none Medication compliance: good compliance Aspirin: yes Recent stressors: no Recurrent headaches: no Visual changes: no Palpitations: no Dyspnea: no Chest pain: no Lower extremity edema: no Dizzy/lightheaded: no    Relevant past medical, surgical, family and social history reviewed and updated as indicated. Interim medical history since our last visit reviewed. Allergies and medications reviewed and updated.  Review of Systems  Constitutional:  Negative for activity change, diaphoresis, fatigue and fever.  Respiratory:  Negative for cough, chest tightness, shortness of breath and wheezing.   Cardiovascular:  Negative for chest pain, palpitations and leg swelling.  Gastrointestinal: Negative.   Endocrine: Negative for polydipsia, polyphagia and polyuria.  Neurological:  Positive for numbness (numbness in hand where IV was placed (left side)). Negative for dizziness, syncope, facial asymmetry, speech difficulty, weakness and headaches.  Psychiatric/Behavioral: Negative.     Per HPI unless specifically indicated above     Objective:    BP (!) 102/59   Pulse (!) 59   Temp 97.9 F (36.6 C) (Oral)   Ht 6' (1.829 m)   Wt 180 lb  12.8 oz (82 kg)   SpO2 98%   BMI 24.52 kg/m   Wt Readings from Last 3 Encounters:  07/23/23 180 lb 12.8 oz (82 kg)  07/15/23 188 lb (85.3 kg)  04/02/23 182 lb 3.2 oz (82.6 kg)    Physical Exam Vitals and nursing note reviewed.  Constitutional:      General: He is awake. He is not in acute distress.    Appearance: Normal appearance. He is well-developed and well-groomed. He is not ill-appearing or toxic-appearing.  HENT:     Head: Normocephalic.     Right Ear: Hearing and external ear normal.     Left Ear: Hearing and external ear normal.  Eyes:     General: Lids are normal.     Extraocular Movements: Extraocular movements intact.     Conjunctiva/sclera: Conjunctivae normal.  Neck:     Thyroid: No thyromegaly.     Vascular: No carotid bruit.  Cardiovascular:     Rate and Rhythm: Normal rate and regular rhythm.     Heart sounds: Normal heart sounds. No murmur heard.    No gallop.  Pulmonary:     Effort: Pulmonary effort is normal. No accessory muscle usage or respiratory distress.     Breath sounds: Normal breath sounds. No decreased breath sounds, wheezing or rhonchi.  Abdominal:     General: Bowel sounds are normal. There is no distension.     Palpations: Abdomen is soft.     Tenderness: There is no abdominal tenderness.  Musculoskeletal:     Cervical back: Full passive range of motion without pain.     Right lower leg: No edema.     Left lower leg: No edema.  Lymphadenopathy:     Cervical: No cervical adenopathy.  Skin:    General: Skin is warm.     Capillary Refill: Capillary refill takes less than 2 seconds.     Comments: Scattered bruises to upper extremities  Neurological:     Mental Status: He is alert and oriented to person, place, and time.     Cranial Nerves: Cranial nerves 2-12 are intact.  Gait: Gait is intact.     Deep Tendon Reflexes: Reflexes are normal and symmetric.     Reflex Scores:      Brachioradialis reflexes are 2+ on the right side and 2+  on the left side.      Patellar reflexes are 2+ on the right side and 2+ on the left side. Psychiatric:        Attention and Perception: Attention normal.        Mood and Affect: Mood normal.        Speech: Speech normal.        Behavior: Behavior normal. Behavior is cooperative.        Thought Content: Thought content normal.    Diabetic Foot Exam - Simple   Simple Foot Form Visual Inspection No deformities, no ulcerations, no other skin breakdown bilaterally: Yes Sensation Testing Intact to touch and monofilament testing bilaterally: Yes Pulse Check Posterior Tibialis and Dorsalis pulse intact bilaterally: Yes Comments     Results for orders placed or performed during the hospital encounter of 07/15/23  Glucose, capillary  Result Value Ref Range   Glucose-Capillary 133 (H) 70 - 99 mg/dL  Basic metabolic panel  Result Value Ref Range   Sodium 134 (L) 135 - 145 mmol/L   Potassium 3.8 3.5 - 5.1 mmol/L   Chloride 103 98 - 111 mmol/L   CO2 22 22 - 32 mmol/L   Glucose, Bld 133 (H) 70 - 99 mg/dL   BUN 25 (H) 8 - 23 mg/dL   Creatinine, Ser 1.61 (H) 0.61 - 1.24 mg/dL   Calcium 8.6 (L) 8.9 - 10.3 mg/dL   GFR, Estimated 51 (L) >60 mL/min   Anion gap 9 5 - 15  Lipoprotein A (LPA)  Result Value Ref Range   Lipoprotein (a) 16.3 <75.0 nmol/L  CBC  Result Value Ref Range   WBC 5.0 4.0 - 10.5 K/uL   RBC 4.67 4.22 - 5.81 MIL/uL   Hemoglobin 14.2 13.0 - 17.0 g/dL   HCT 09.6 04.5 - 40.9 %   MCV 86.9 80.0 - 100.0 fL   MCH 30.4 26.0 - 34.0 pg   MCHC 35.0 30.0 - 36.0 g/dL   RDW 81.1 91.4 - 78.2 %   Platelets 180 150 - 400 K/uL   nRBC 0.0 0.0 - 0.2 %  POCT Activated clotting time  Result Value Ref Range   Activated Clotting Time 331 seconds      Assessment & Plan:   Problem List Items Addressed This Visit       Cardiovascular and Mediastinum   CAD (coronary artery disease)    Chronic, stable at present.  Continue current medication regimen and collaboration with  cardiology.      Relevant Medications   amLODipine (NORVASC) 5 MG tablet   atorvastatin (LIPITOR) 80 MG tablet   ezetimibe (ZETIA) 10 MG tablet   ramipril (ALTACE) 2.5 MG capsule   Hypertension associated with type 2 diabetes mellitus (HCC)    Chronic, stable.  BP at goal on check today for age.  Will continue current medication regimen and cardiology collaboration -- he plans to schedule follow-up with them.  Recommend he monitor BP at home at least a few mornings a week and document for provider + focus on DASH diet.  Could consider addition of Amlodipine if elevation in SBP noted.  Urine ALB 150 (July 2024), continue Ramipril for kidney protection.  LABS: up to date.        Relevant Medications  amLODipine (NORVASC) 5 MG tablet   atorvastatin (LIPITOR) 80 MG tablet   ezetimibe (ZETIA) 10 MG tablet   ramipril (ALTACE) 2.5 MG capsule   Other Relevant Orders   Bayer DCA Hb A1c Waived   CBC with Differential/Platelet   Comprehensive metabolic panel   TSH   AMB Referral VBCI Care Management     Digestive   GERD (gastroesophageal reflux disease)    Chronic, ongoing, stable with daily PPI.  Would benefit from trial reduction in future, discussed with patient as magnesium continues to be low.  At this time maintain and monitor.  Mag level today. Risks of PPI use were discussed with patient including bone loss, C. Diff diarrhea, pneumonia, infections, CKD, electrolyte abnormalities.  Verbalizes understanding and chooses to continue the medication.       Relevant Orders   Magnesium     Endocrine   CKD stage 3 due to type 2 diabetes mellitus (HCC)    Chronic, ongoing CKD 3a to 3 b on checks.  Followed by endocrinology with A1c 9.3% today (trend down) and urine ALB 150 (July 2024) -- continue Ramipril for kidney protection.  Continue current medication regimen as prescribed by endocrinology, he plans to reschedule with them and suspect they may adjust - his sugars have been lower since  returning home, notes reviewed.  PharmD referral in to assist with monitor and perhaps and SGLT2. Recommend he check BS at least twice a day, fasting and 2 hours after a meal.   - Foot and eye exams up to date - Vaccinations up to date - ACE and Statin on board.      Relevant Medications   atorvastatin (LIPITOR) 80 MG tablet   ramipril (ALTACE) 2.5 MG capsule   Other Relevant Orders   Bayer DCA Hb A1c Waived   CBC with Differential/Platelet   Comprehensive metabolic panel   Hyperlipidemia associated with type 2 diabetes mellitus (HCC)    Chronic, ongoing.  Continue Atorvastatin, Lovaza, and Fenofibrate, tolerating at this time.  Adjust dose as needed.  Lipid panel today.  Insurance would not cover Repatha.      Relevant Medications   amLODipine (NORVASC) 5 MG tablet   atorvastatin (LIPITOR) 80 MG tablet   ezetimibe (ZETIA) 10 MG tablet   ramipril (ALTACE) 2.5 MG capsule   Other Relevant Orders   Bayer DCA Hb A1c Waived   Lipid Panel w/o Chol/HDL Ratio   AMB Referral VBCI Care Management   Type 2 diabetes mellitus with hyperglycemia, with long-term current use of insulin (HCC)    Chronic, ongoing.  Followed by endocrinology and will call to schedule as missed recent visit.  A1c 9.3% today (trend up after hospitalization) and urine ALB 150 (July 2024) -- continue Ramipril for kidney protection.  Continue current medication regimen as prescribed by endocrinology as suspect they may change but he has had some lower readings since returning home, notes reviewed.  CCM collaboration continues -- may need help with cost of medications in future. Recommend he check BS at least twice a day, fasting and 2 hours after a meal.   - Foot and eye exams up to date - Vaccinations up to date - ACE and Statin on board.      Relevant Medications   atorvastatin (LIPITOR) 80 MG tablet   ramipril (ALTACE) 2.5 MG capsule   Other Relevant Orders   Bayer DCA Hb A1c Waived   AMB Referral VBCI Care  Management   Type 2 diabetes mellitus with proteinuria (  HCC) - Primary    Refer to CKD plan of care.      Relevant Medications   atorvastatin (LIPITOR) 80 MG tablet   ramipril (ALTACE) 2.5 MG capsule   Other Relevant Orders   Bayer DCA Hb A1c Waived   AMB Referral VBCI Care Management     Other   History of non-ST elevation myocardial infarction (NSTEMI)    Acute and improved at this time. Continue current medication regimen as ordered by cardiology.  He is schedule to follow-up with [facility] cardiology and plans to continue with them.  To stay on ASA indefinitely and Prasugel for 12 months (start 07/18/23).      Hypertriglyceridemia    Ongoing issue with statin, Fenofibrate, and Fish Oil on board, will recheck Lipid panel today and continue collaboration with cardiology.  Attempted to get Repatha in past but insurance denied this.       Relevant Medications   amLODipine (NORVASC) 5 MG tablet   atorvastatin (LIPITOR) 80 MG tablet   ezetimibe (ZETIA) 10 MG tablet   ramipril (ALTACE) 2.5 MG capsule   Other Relevant Orders   Comprehensive metabolic panel   Lipid Panel w/o Chol/HDL Ratio   Insomnia    Chronic, ongoing.   Belsomra too costly.  Would avoid Ambien and benzo use due to age and BEERS.  Continue Amtriptyline 40 MG at night which has offered benefit to him. Continue to monitor and adjust regimen as needed.  Recommend sleep hygiene techniques.  Could consider therapy for CBT in future.          Follow up plan: Return in about 3 months (around 10/23/2023) for T2DM, HTN/HLD, CKD.

## 2023-07-23 NOTE — Assessment & Plan Note (Signed)
Chronic, ongoing, stable with daily PPI.  Would benefit from trial reduction in future, discussed with patient as magnesium continues to be low.  At this time maintain and monitor.  Mag level today. Risks of PPI use were discussed with patient including bone loss, C. Diff diarrhea, pneumonia, infections, CKD, electrolyte abnormalities.  Verbalizes understanding and chooses to continue the medication.

## 2023-07-23 NOTE — Assessment & Plan Note (Signed)
Chronic, ongoing.  Followed by endocrinology and will call to schedule as missed recent visit.  A1c 9.3% today (trend up after hospitalization) and urine ALB 150 (July 2024) -- continue Ramipril for kidney protection.  Continue current medication regimen as prescribed by endocrinology as suspect they may change but he has had some lower readings since returning home, notes reviewed.  CCM collaboration continues -- may need help with cost of medications in future. Recommend he check BS at least twice a day, fasting and 2 hours after a meal.   - Foot and eye exams up to date - Vaccinations up to date - ACE and Statin on board.

## 2023-07-23 NOTE — Assessment & Plan Note (Signed)
Chronic, stable.  BP at goal on check today for age.  Will continue current medication regimen and cardiology collaboration -- he plans to schedule follow-up with them.  Recommend he monitor BP at home at least a few mornings a week and document for provider + focus on DASH diet.  Could consider addition of Amlodipine if elevation in SBP noted.  Urine ALB 150 (July 2024), continue Ramipril for kidney protection.  LABS: up to date.

## 2023-07-24 LAB — COMPREHENSIVE METABOLIC PANEL
ALT: 18 [IU]/L (ref 0–44)
AST: 22 [IU]/L (ref 0–40)
Albumin: 4.2 g/dL (ref 3.8–4.8)
Alkaline Phosphatase: 68 [IU]/L (ref 44–121)
BUN/Creatinine Ratio: 15 (ref 10–24)
BUN: 27 mg/dL (ref 8–27)
Bilirubin Total: 0.6 mg/dL (ref 0.0–1.2)
CO2: 20 mmol/L (ref 20–29)
Calcium: 9.4 mg/dL (ref 8.6–10.2)
Chloride: 101 mmol/L (ref 96–106)
Creatinine, Ser: 1.86 mg/dL — ABNORMAL HIGH (ref 0.76–1.27)
Globulin, Total: 2.2 g/dL (ref 1.5–4.5)
Glucose: 163 mg/dL — ABNORMAL HIGH (ref 70–99)
Potassium: 5 mmol/L (ref 3.5–5.2)
Sodium: 137 mmol/L (ref 134–144)
Total Protein: 6.4 g/dL (ref 6.0–8.5)
eGFR: 38 mL/min/{1.73_m2} — ABNORMAL LOW (ref >59)

## 2023-07-24 LAB — CBC WITH DIFFERENTIAL/PLATELET
Basophils Absolute: 0 10*3/uL (ref 0.0–0.2)
Basos: 0 %
EOS (ABSOLUTE): 0.2 10*3/uL (ref 0.0–0.4)
Eos: 4 %
Hematocrit: 45.1 % (ref 37.5–51.0)
Hemoglobin: 14.8 g/dL (ref 13.0–17.7)
Immature Grans (Abs): 0 10*3/uL (ref 0.0–0.1)
Immature Granulocytes: 0 %
Lymphocytes Absolute: 1.6 10*3/uL (ref 0.7–3.1)
Lymphs: 31 %
MCH: 30.3 pg (ref 26.6–33.0)
MCHC: 32.8 g/dL (ref 31.5–35.7)
MCV: 92 fL (ref 79–97)
Monocytes Absolute: 0.4 10*3/uL (ref 0.1–0.9)
Monocytes: 8 %
Neutrophils Absolute: 3 10*3/uL (ref 1.4–7.0)
Neutrophils: 57 %
Platelets: 243 10*3/uL (ref 150–450)
RBC: 4.89 x10E6/uL (ref 4.14–5.80)
RDW: 13.1 % (ref 11.6–15.4)
WBC: 5.2 10*3/uL (ref 3.4–10.8)

## 2023-07-24 LAB — LIPID PANEL W/O CHOL/HDL RATIO
Cholesterol, Total: 146 mg/dL (ref 100–199)
HDL: 28 mg/dL — ABNORMAL LOW (ref >39)
LDL Chol Calc (NIH): 60 mg/dL (ref 0–99)
Triglycerides: 372 mg/dL — ABNORMAL HIGH (ref 0–149)
VLDL Cholesterol Cal: 58 mg/dL — ABNORMAL HIGH (ref 5–40)

## 2023-07-24 LAB — TSH: TSH: 1.15 u[IU]/mL (ref 0.450–4.500)

## 2023-07-24 LAB — MAGNESIUM: Magnesium: 1.6 mg/dL (ref 1.6–2.3)

## 2023-07-25 ENCOUNTER — Other Ambulatory Visit: Payer: Self-pay | Admitting: Nurse Practitioner

## 2023-07-25 DIAGNOSIS — E1122 Type 2 diabetes mellitus with diabetic chronic kidney disease: Secondary | ICD-10-CM

## 2023-07-25 NOTE — Progress Notes (Signed)
Contacted via MyChart     Good morning [name], your labs have returned: - Kidney function, creatinine and eGFR, continues to be on low side.  A little lower this check.  I would like to recheck this in 4 weeks outpatient and see if remaining lower to determine if we need to get you into kidney doctor. For now ensure good water intake at home.  No ibuprofen. - Lipid panel shows LDL at goal, but triglycerides remain a little elevated we will watch this and if does not improve we will work on getting Repatha again.  - Remainder of labs are stable.  Any questions? Keep being stellar!!  Thank you for allowing me to participate in your care.  I appreciate you. Kindest regards, Imanol Bihl

## 2023-07-25 NOTE — Progress Notes (Signed)
Needs 4 week lab only visit please

## 2023-07-26 ENCOUNTER — Telehealth: Payer: Self-pay

## 2023-07-26 NOTE — Progress Notes (Signed)
   Care Guide Note  07/26/2023 Name: [name] MRN: [id number] DOB: [birth year]  Referred by: Marjie Skiff, NP Reason for referral : Care Coordination (Outreach to schedule with Pharm d )   [name] is a 72 y.o. year old male who is a primary care patient of Cannady, Dorie Rank, NP. [name] was referred to the pharmacist for assistance related to DM.    Successful contact was made with the patient to discuss pharmacy services including being ready for the pharmacist to call at least 5 minutes before the scheduled appointment time, to have medication bottles and any blood sugar or blood pressure readings ready for review. The patient agreed to meet with the pharmacist via with the pharmacist via telephone visit on (date/time).  08/03/2023  Penne Lash, RMA Care Guide [facility]  [place], [place] [address] Direct Dial: [phone] [email or web address]    SIG

## 2023-07-26 NOTE — Progress Notes (Signed)
Attempted to reach patient, LVM to call office back to schedule his AWV.  Will attempt to try again later.

## 2023-07-26 NOTE — Transitions of Care (Post Inpatient/ED Visit) (Signed)
   07/26/2023  Name: Alvarez Paccione MRN: [id number] DOB: 1950-09-30  Today's TOC FU Call Status: Today's TOC FU Call Status:: Unsuccessful Call (3rd Attempt) Unsuccessful Call (1st Attempt) Date: 07/22/23 Unsuccessful Call (2nd Attempt) Date: 07/23/23 Unsuccessful Call (3rd Attempt) Date: 07/26/23  Attempted to reach the patient regarding the most recent Inpatient/ED visit.  Follow Up Plan: No further outreach attempts will be made at this time. We have been unable to contact the patient.  Susa Loffler , BSN, RN Care Management Coordinator [facility] [email or web address] Direct Dial: [phone]

## 2023-08-03 ENCOUNTER — Other Ambulatory Visit: Payer: Self-pay

## 2023-08-03 NOTE — Progress Notes (Signed)
08/03/2023 Name: [name] MRN: [id number] DOB: [birth year]  Chief Complaint  Patient presents with   Medication Management   [name] is a 72 y.o. year old male who presented for a telephone visit.   They were referred to the pharmacist by their PCP for assistance in managing medication access.    Subjective:  Care Team: Primary Care Provider: Marjie Skiff, NP ; Next Scheduled Visit: 10/22/23  Medication Access/Adherence  Current Pharmacy:  [id number] [place], [place] - [address] AT [facility] [address] [place] [address] [place] [address] Phone: [phone] Fax: [phone]  [facility] - [place], [place] - [address] [place] [address] Phone: [phone] Fax: [phone]  -Patient reports affordability concerns with their medications: Yes  -Patient reports access/transportation concerns to their pharmacy: No  -Patient reports adherence concerns with their medications:  No    Diabetes: Current medications: glipizide 10mg  daily, Tresiba 60 units at bedtime, metformin 1000mg  BID -Medications tried in the past: Trulicity caused nause -Patient checks FBG regularly and endorses this is ranging 90's-135 -He does not endorse any occurrences of hypoglycemia -Followed by Dr. Gershon Crane with endocrinology and states provider BG goal for patient is 150 -A1c 11/8 was 9.3% -Patient previously prescribed Jardiance but could not afford this medication -Prescribed CGM previously but insurance would not cover  Hyperlipidemia  -Current medications:  atorvastatin 80mg  daily, ezetimibe 10mg  daily -Patient recently had NSTEMI earlier this month, and atorvastatin replaced rosuvastatin -PCP note states Repatha prescribed previously but patient was not able to get (likely due to insurance coverage) -LDL currently at goal of <70  Objective: Lab Results  Component Value Date   HGBA1C 9.3  (H) 07/23/2023   Lab Results  Component Value Date   CREATININE 1.86 (H) 07/23/2023   BUN 27 07/23/2023   NA 137 07/23/2023   K 5.0 07/23/2023   CL 101 07/23/2023   CO2 20 07/23/2023   Lab Results  Component Value Date   CHOL 146 07/23/2023   HDL 28 (L) 07/23/2023   LDLCALC 60 07/23/2023   TRIG 372 (H) 07/23/2023   CHOLHDL 4.9 04/02/2023   Medications Reviewed Today     Reviewed by Lenna Gilford, RPH (Pharmacist) on 08/03/23 at 1447  Med List Status: <None>   Medication Order Taking? Sig Documenting Provider Last Dose Status Informant  ACCU-CHEK GUIDE test strip [id number] Yes 1 each by Other route 2 times daily at 12 noon and 4 pm. [provider] Taking Active Self  albuterol (VENTOLIN HFA) 108 (90 Base) MCG/ACT inhaler [id number] Yes Inhale 2 puffs into the lungs every 6 (six) hours as needed. Aura Dials T, NP Taking Active Self  amitriptyline (ELAVIL) 10 MG tablet [id number] Yes TAKE 4 TABLETS AT BEDTIME Cannady, Jolene T, NP Taking Active   amLODipine (NORVASC) 5 MG tablet [id number] Yes Take 1 tablet by mouth daily. [provider] Taking Active   aspirin EC 81 MG tablet [id number] Yes Take 81 mg by mouth daily.  [provider] Taking Active Self           Med Note Genoveva Ill Jun 27, 2018  4:52 PM)    atenolol (TENORMIN) 50 MG tablet [id number] Yes TAKE 1 TABLET EVERY DAY Cannady, Jolene T, NP Taking Active Self  atorvastatin (LIPITOR) 80 MG tablet [id number] Yes Take 80 mg by mouth daily. [provider]  Taking Active   Continuous Glucose Receiver (FREESTYLE LIBRE 3 READER) DEVI [id number] No Is on insulin to check blood sugar regularly with Freestyle. E11.65, Z79.4  Patient not taking: Reported on 08/03/2023   Aura Dials T, NP Not Taking Active   Continuous Glucose Sensor (FREESTYLE LIBRE 3 SENSOR) [place] [id number] No To check sugars regularly is on insulin Dx code E11.65, Z79.4  Patient not taking: Reported on  08/03/2023   Aura Dials T, NP Not Taking Active   ezetimibe (ZETIA) 10 MG tablet [id number] Yes Take 10 mg by mouth daily. [provider] Taking Active   fenofibrate (TRICOR) 145 MG tablet [id number] Yes TAKE 1 TABLET EVERY DAY Cannady, Jolene T, NP Taking Active Self  glipiZIDE (GLUCOTROL XL) 10 MG 24 hr tablet [id number] Yes Take 10 mg by mouth daily. [provider] Taking Active Self  insulin degludec (TRESIBA FLEXTOUCH) 100 UNIT/ML FlexTouch Pen [id number] Yes Inject 60 Units into the skin at bedtime. [provider] Taking Active Self           Med Note Lenna Gilford   Tue Aug 03, 2023  2:38 PM) 60 units currently  metFORMIN (GLUCOPHAGE) 500 MG tablet [id number] Yes TAKE 2 TABLETS TWICE A DAY WITH MEALS Cannady, Jolene T, NP Taking Active Self  neomycin-polymyxin b-dexamethasone (MAXITROL) 3.5-10000-0.1 SUSP [id number] No PLACE 1 DROP INTO THE LEFT EYE 4 (FOUR) TIMES DAILY. Aura Dials T, NP Unknown Active Self  nitroGLYCERIN (NITROSTAT) 0.4 MG SL tablet [id number] Yes Place 0.4 mg under the tongue every 5 (five) minutes as needed. [provider] Taking Active Self  omega-3 acid ethyl esters (LOVAZA) 1 g capsule [id number] Yes TAKE 2 CAPSULES (2 G TOTAL) BY MOUTH 2 (TWO) TIMES DAILY. Aura Dials T, NP Taking Active   pantoprazole (PROTONIX) 20 MG tablet [id number] Yes TAKE 1 TABLET EVERY DAY (DISCONTINUE 40MG  DOSE) Cannady, Jolene T, NP Taking Active Self  prasugrel (EFFIENT) 10 MG TABS tablet [id number] Yes Take 1 tablet by mouth daily. [provider] Taking Active   ramipril (ALTACE) 2.5 MG capsule [id number] Yes Take 2.5 mg by mouth daily. [provider] Taking Active   vitamin B-12 (CYANOCOBALAMIN) 1000 MCG tablet [id number] No Take 1,000 mcg by mouth daily. [provider] Unknown Active Self           Assessment/Plan:   Diabetes: - Currently uncontrolled - PCP sent new order for [facility] 3 reader and sensors to  patient's pharmacy.  Patient contacted, and reader is going through for $6.09 and sensors fir $35.41, which patient states is affordable.  Pharmacy is filling and will mail to him. - A1c not at goal but recent FBG reflects better control; patient even had to decrease Tresiba from 75 units nightly to 60. - I do recommend SGLT2 therapy, and will evaluate patient eligibility for Jardiance or Farxiga PAP - We could also try Rybelsus to see if he tolerates this better than Trulicity tried in the past- may also qualify for PAP - Consulting PCP and will discuss further with patient at 12/4 follow-up visit  Hyperlipidemia  -Currently controlled with LDL <70 -I recommend continuation of current regimen at this time with follow-up lipid panel in early February since statin recently changed.  Can look into Repatha/Praluent or bempedoic acid therapy at that time if needed  Follow Up Plan: 12/4 in person to educate on Libre 3 and further discuss diabetes management  Lenna Gilford, PharmD, DPLA

## 2023-08-05 DIAGNOSIS — Z951 Presence of aortocoronary bypass graft: Secondary | ICD-10-CM | POA: Diagnosis not present

## 2023-08-05 DIAGNOSIS — I214 Non-ST elevation (NSTEMI) myocardial infarction: Secondary | ICD-10-CM | POA: Diagnosis not present

## 2023-08-10 DIAGNOSIS — Z951 Presence of aortocoronary bypass graft: Secondary | ICD-10-CM | POA: Diagnosis not present

## 2023-08-10 DIAGNOSIS — I214 Non-ST elevation (NSTEMI) myocardial infarction: Secondary | ICD-10-CM | POA: Diagnosis not present

## 2023-08-18 ENCOUNTER — Other Ambulatory Visit: Payer: Self-pay

## 2023-08-19 MED ORDER — MAXITROL 3.5-10000-0.1 OP OINT: 1.0000 | TOPICAL_OINTMENT | Freq: Four times a day (QID) | OPHTHALMIC | 1 refills | Status: DC

## 2023-08-19 MED ORDER — EZETIMIBE 10 MG PO TABS: 10.0000 mg | ORAL_TABLET | Freq: Every day | ORAL | 3 refills | Status: DC

## 2023-08-19 NOTE — Progress Notes (Signed)
   08/19/2023  Patient ID: [name], male   DOB: Dec 26, 1950, 72 y.o.   MRN: [id number]  S/O Patient presenting to [facility] for [facility] 3 education and evaluation of PAP eligibility for SGLT2 therapy  Diabetes -Current medications:  glipizide xl 10mg  daily, metformin 1000mg  BID, Tresiba 60 units daily -Patient is using Freestyle Libre3 for CGM, and education was provided around application of sensors, charging of reader, alerts, monitoring and goal fasting and post-prandial BG -Per [employer] reader, average BG the past 14 days is 160 -A1c 11/8 was 9.4% -PCP would like to initiate SGLT2 therapy for DM in the setting of recent MI and also for renal protection; Jardiance was not affordable for him in the past  Medication Management -Patient prescribed ezetimibe 10mg  daily after recent MI, and prescription is almost gone with no refills remaining- he is requesting refills -He was also prescribed prasugrel 10mg  daily and is needing a refill on this.  Refills remain at [facility] outpatient pharmacy, but patient would like these to come from 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  Drops were most recently prescribed, but he states ointment is more effective   A/P  Diabetes -Recommend addition of Farxiga 10mg  daily to current regimen.  Patient would qualify for AZ&Me PAP.  If PCP is in agreement, I will coordinate with medication assistance team to initiate the application process -Recommended that patient bring Libre3 reader into visits with PCP and endocrinologist  Medication Management -Pending refills for ezetimibe 10mg  and generic Maxitrol ointment for PCP to sign if in agreement -I contacted [facility] to request they transfer remaining Prasugrel refills from [facility], but the patient has to make this request.  I will inform him of this.  Follow-up:  Will monitor progress of Farxiga PAP to keep patient/provider informed and follow-up  appropriately  Lenna Gilford, PharmD, DPLA

## 2023-08-19 NOTE — Progress Notes (Signed)
   08/19/2023  Patient ID: [name], male   DOB: [birth year], 72 y.o.   MRN: [id number]  PCP is okay with Farxiga 10mg  but wanted to verify endocrinologist, Dr. Gershon Crane, is as well.  I was able to speak with his CMA to leave a message and my direct phone number.  She states I should expect a response by EOD tomorrow.  Lenna Gilford, PharmD, DPLA

## 2023-08-20 NOTE — Progress Notes (Signed)
   08/20/2023  Patient ID: [name], male   DOB: 09/12/1951, 72 y.o.   MRN: [id number]  Dr. Elberta Fortis office returned my phone call, and the provider is okay with initiating Farxiga 10mg  daily for PAP purposes since patient was previously prescribed Jardiance, which was not affordable.  I will coordinate with medication assistance team and providers to initiate AZ&Me PAP process.  Patient is aware of this, and that prescriptions were sent for his ezetimibe 10mg  and Maxitrol eye ointment.  He has also contacted [facility] to initiate prescription transfer process for his prasugrel.  Lenna Gilford, PharmD, DPLA

## 2023-08-23 ENCOUNTER — Other Ambulatory Visit: Payer: Self-pay

## 2023-08-23 ENCOUNTER — Other Ambulatory Visit: Payer: Medicare PPO

## 2023-08-23 DIAGNOSIS — E1122 Type 2 diabetes mellitus with diabetic chronic kidney disease: Secondary | ICD-10-CM | POA: Diagnosis not present

## 2023-08-23 DIAGNOSIS — N183 Chronic kidney disease, stage 3 unspecified: Secondary | ICD-10-CM | POA: Diagnosis not present

## 2023-08-23 MED ORDER — PRASUGREL HCL 10 MG PO TABS: 10.0000 mg | ORAL_TABLET | Freq: Every day | ORAL | 4 refills | Status: DC

## 2023-08-24 LAB — BASIC METABOLIC PANEL
BUN/Creatinine Ratio: 13 (ref 10–24)
BUN: 22 mg/dL (ref 8–27)
CO2: 23 mmol/L (ref 20–29)
Calcium: 9.1 mg/dL (ref 8.6–10.2)
Chloride: 108 mmol/L — ABNORMAL HIGH (ref 96–106)
Creatinine, Ser: 1.73 mg/dL — ABNORMAL HIGH (ref 0.76–1.27)
Glucose: 86 mg/dL (ref 70–99)
Potassium: 4.4 mmol/L (ref 3.5–5.2)
Sodium: 144 mmol/L (ref 134–144)
eGFR: 41 mL/min/{1.73_m2} — ABNORMAL LOW (ref >59)

## 2023-08-24 NOTE — Progress Notes (Signed)
Contacted via MyChart   Good morning [name], your labs have returned and kidney function is trending back up some.  Is in chronic kidney disease Stage 3b.  If enters into 4 and stays there then I do recommend seeing kidney doctor.  Any questions?

## 2023-09-23 ENCOUNTER — Other Ambulatory Visit: Payer: Self-pay | Admitting: Nurse Practitioner

## 2023-09-27 NOTE — Telephone Encounter (Signed)
 Requested Prescriptions  Pending Prescriptions Disp Refills   ramipril  (ALTACE ) 1.25 MG capsule [Pharmacy Med Name: Ramipril  Oral Capsule 1.25 MG] 90 capsule     Sig: TAKE 1 CAPSULE EVERY DAY     Cardiovascular:  ACE Inhibitors Failed - 09/27/2023 10:48 AM      Failed - Cr in normal range and within 180 days    Creatinine  Date Value Ref Range Status  09/20/2014 1.10 0.60 - 1.30 mg/dL Final   Creatinine, Ser  Date Value Ref Range Status  08/23/2023 1.73 (H) 0.76 - 1.27 mg/dL Final         Passed - K in normal range and within 180 days    Potassium  Date Value Ref Range Status  08/23/2023 4.4 3.5 - 5.2 mmol/L Final  08/16/2014 4.9 3.5 - 5.1 mmol/L Final         Passed - Patient is not pregnant      Passed - Last BP in normal range    BP Readings from Last 1 Encounters:  07/23/23 (!) 102/59         Passed - Valid encounter within last 6 months    Recent Outpatient Visits           2 months ago Type 2 diabetes mellitus with proteinuria (HCC)   [place] [facility] [place], [place] T, NP   5 months ago Type 2 diabetes mellitus with proteinuria (HCC)   [place] [facility] [place], [place] T, NP   7 months ago Left nephrolithiasis   [place] [facility] Mecum, Rocky BRAVO, PA-C   9 months ago Community acquired pneumonia of right lower lobe of lung   [place] [facility] [place], [place] T, NP   10 months ago AKI (acute kidney injury) (HCC)   [place] [facility] [place], Melanie T, NP       Future Appointments             In 3 weeks Cannady, Jolene T, NP [place] [facility], [name]             metFORMIN  (GLUCOPHAGE ) 500 MG tablet [Pharmacy Med Name: metFORMIN  HCl Oral Tablet 500 MG] 360 tablet     Sig: TAKE 2 TABLETS TWICE DAILY WITH MEALS     Endocrinology:  Diabetes - Biguanides Failed - 09/27/2023 10:48 AM      Failed - Cr in normal range and within 360 days    Creatinine   Date Value Ref Range Status  09/20/2014 1.10 0.60 - 1.30 mg/dL Final   Creatinine, Ser  Date Value Ref Range Status  08/23/2023 1.73 (H) 0.76 - 1.27 mg/dL Final         Failed - HBA1C is between 0 and 7.9 and within 180 days    Hemoglobin A1C  Date Value Ref Range Status  11/06/2019 7.2  Final   HB A1C (BAYER DCA - WAIVED)  Date Value Ref Range Status  07/23/2023 9.3 (H) 4.8 - 5.6 % Final    Comment:             Prediabetes: 5.7 - 6.4          Diabetes: >6.4          Glycemic control for adults with diabetes: <7.0          Failed - eGFR in normal range and within 360 days    EGFR (African American)  Date Value Ref Range Status  09/20/2014 >60 >82mL/min Final  05/29/2014 >60  Final   GFR calc Af Amer  Date Value Ref Range Status  09/03/2020 72 >59 mL/min/1.73 Final    Comment:    **In accordance with recommendations from the NKF-ASN Task force,**   [facility] is in the process of updating its eGFR calculation to the   2021 CKD-EPI creatinine equation that estimates kidney function   without a race variable.    EGFR (Non-African Amer.)  Date Value Ref Range Status  09/20/2014 >60 >13mL/min Final    Comment:    eGFR values <36mL/min/1.73 m2 may be an indication of chronic kidney disease (CKD). Calculated eGFR, using the MRDR Study equation, is useful in  patients with stable renal function. The eGFR calculation will not be reliable in acutely ill patients when serum creatinine is changing rapidly. It is not useful in patients on dialysis. The eGFR calculation may not be applicable to patients at the low and high extremes of body sizes, pregnant women, and vegetarians.   05/29/2014 >60  Final    Comment:    eGFR values <71mL/min/1.73 m2 may be an indication of chronic kidney disease (CKD). Calculated eGFR is useful in patients with stable renal function. The eGFR calculation will not be reliable in acutely ill patients when serum creatinine is changing rapidly.  It is not useful in  patients on dialysis. The eGFR calculation may not be applicable to patients at the low and high extremes of body sizes, pregnant women, and vegetarians.    GFR, Estimated  Date Value Ref Range Status  07/18/2023 37 (L) >60 mL/min Final    Comment:    (NOTE) Calculated using the CKD-EPI Creatinine Equation (2021)    eGFR  Date Value Ref Range Status  08/23/2023 41 (L) >59 mL/min/1.73 Final         Passed - B12 Level in normal range and within 720 days    Vitamin B-12  Date Value Ref Range Status  07/31/2022 434 232 - 1,245 pg/mL Final         Passed - Valid encounter within last 6 months    Recent Outpatient Visits           2 months ago Type 2 diabetes mellitus with proteinuria (HCC)   [place] [facility] [place], [place] T, NP   5 months ago Type 2 diabetes mellitus with proteinuria (HCC)   [place] [facility] [place], Melanie T, NP   7 months ago Left nephrolithiasis   [place] [facility] Mecum, Rocky BRAVO, PA-C   9 months ago Community acquired pneumonia of right lower lobe of lung   [place] [facility] [place], [place] T, NP   10 months ago AKI (acute kidney injury) (HCC)   [place] [facility] [place], Melanie T, NP       Future Appointments             In 3 weeks Cannady, Jolene T, NP [place] [name]            Passed - CBC within normal limits and completed in the last 12 months    WBC  Date Value Ref Range Status  07/23/2023 5.2 3.4 - 10.8 x10E3/uL Final  07/18/2023 6.2 4.0 - 10.5 K/uL Final   RBC  Date Value Ref Range Status  07/23/2023 4.89 4.14 - 5.80 x10E6/uL Final  07/18/2023 5.77 4.22 - 5.81 MIL/uL Final   Hemoglobin  Date Value  Ref Range Status  07/23/2023 14.8 13.0 - 17.7 g/dL Final   Hematocrit  Date Value Ref Range Status  07/23/2023 45.1 37.5 - 51.0 % Final   MCHC  Date Value Ref Range Status  07/23/2023  32.8 31.5 - 35.7 g/dL Final  88/96/7975 65.2 30.0 - 36.0 g/dL Final   [facility]  Date Value Ref Range Status  07/23/2023 30.3 26.6 - 33.0 pg Final  07/18/2023 31.2 26.0 - 34.0 pg Final   MCV  Date Value Ref Range Status  07/23/2023 92 79 - 97 fL Final  08/16/2014 95 80 - 100 fL Final   No results found for: PLTCOUNTKUC, LABPLAT, POCPLA RDW  Date Value Ref Range Status  07/23/2023 13.1 11.6 - 15.4 % Final  08/16/2014 13.4 11.5 - 14.5 % Final          pantoprazole  (PROTONIX ) 20 MG tablet [Pharmacy Med Name: Pantoprazole  Sodium Oral Tablet Delayed Release 20 MG] 90 tablet 01    Sig: TAKE 1 TABLET EVERY DAY (DISCONTINUE 40MG  DOSE)     Gastroenterology: Proton Pump Inhibitors Passed - 09/27/2023 10:48 AM      Passed - Valid encounter within last 12 months    Recent Outpatient Visits           2 months ago Type 2 diabetes mellitus with proteinuria (HCC)   [place] [facility] [place], [place] T, NP   5 months ago Type 2 diabetes mellitus with proteinuria (HCC)   [place] [facility] [place], Melanie T, NP   7 months ago Left nephrolithiasis   [place] [facility] Mecum, Rocky BRAVO, PA-C   9 months ago Community acquired pneumonia of right lower lobe of lung   [place] [facility] [place], [place] T, NP   10 months ago AKI (acute kidney injury) (HCC)   [place] [facility] [place], Melanie DASEN, NP       Future Appointments             In 3 weeks Cannady, Jolene T, NP [place] [facility], [name]

## 2023-09-27 NOTE — Telephone Encounter (Signed)
 Requested medication (s) are due for refill today: yes  Requested medication (s) are on the active medication list: yes  Last refill:  07/12/23 #360  Future visit scheduled: yes  Notes to clinic:  lab work outside normal range   Requested Prescriptions  Pending Prescriptions Disp Refills   metFORMIN  (GLUCOPHAGE ) 500 MG tablet [Pharmacy Med Name: metFORMIN  HCl Oral Tablet 500 MG] 360 tablet     Sig: TAKE 2 TABLETS TWICE DAILY WITH MEALS     Endocrinology:  Diabetes - Biguanides Failed - 09/27/2023 10:51 AM      Failed - Cr in normal range and within 360 days    Creatinine  Date Value Ref Range Status  09/20/2014 1.10 0.60 - 1.30 mg/dL Final   Creatinine, Ser  Date Value Ref Range Status  08/23/2023 1.73 (H) 0.76 - 1.27 mg/dL Final         Failed - HBA1C is between 0 and 7.9 and within 180 days    Hemoglobin A1C  Date Value Ref Range Status  11/06/2019 7.2  Final   HB A1C (BAYER DCA - WAIVED)  Date Value Ref Range Status  07/23/2023 9.3 (H) 4.8 - 5.6 % Final    Comment:             Prediabetes: 5.7 - 6.4          Diabetes: >6.4          Glycemic control for adults with diabetes: <7.0          Failed - eGFR in normal range and within 360 days    EGFR (African American)  Date Value Ref Range Status  09/20/2014 >60 >74mL/min Final  05/29/2014 >60  Final   GFR calc Af Amer  Date Value Ref Range Status  09/03/2020 74 >59 mL/min/1.73 Final    Comment:    **In accordance with recommendations from the NKF-ASN Task force,**   [facility] is in the process of updating its eGFR calculation to the   2021 CKD-EPI creatinine equation that estimates kidney function   without a race variable.    EGFR (Non-African Amer.)  Date Value Ref Range Status  09/20/2014 >60 >106mL/min Final    Comment:    eGFR values <66mL/min/1.73 m2 may be an indication of chronic kidney disease (CKD). Calculated eGFR, using the MRDR Study equation, is useful in  patients with stable renal  function. The eGFR calculation will not be reliable in acutely ill patients when serum creatinine is changing rapidly. It is not useful in patients on dialysis. The eGFR calculation may not be applicable to patients at the low and high extremes of body sizes, pregnant women, and vegetarians.   05/29/2014 >60  Final    Comment:    eGFR values <15mL/min/1.73 m2 may be an indication of chronic kidney disease (CKD). Calculated eGFR is useful in patients with stable renal function. The eGFR calculation will not be reliable in acutely ill patients when serum creatinine is changing rapidly. It is not useful in  patients on dialysis. The eGFR calculation may not be applicable to patients at the low and high extremes of body sizes, pregnant women, and vegetarians.    GFR, Estimated  Date Value Ref Range Status  07/18/2023 37 (L) >60 mL/min Final    Comment:    (NOTE) Calculated using the CKD-EPI Creatinine Equation (2021)    eGFR  Date Value Ref Range Status  08/23/2023 41 (L) >59 mL/min/1.73 Final  Passed - B12 Level in normal range and within 720 days    Vitamin B-12  Date Value Ref Range Status  07/31/2022 434 232 - 1,245 pg/mL Final         Passed - Valid encounter within last 6 months    Recent Outpatient Visits           2 months ago Type 2 diabetes mellitus with proteinuria (HCC)   [place] [facility] [place], [place] T, NP   5 months ago Type 2 diabetes mellitus with proteinuria (HCC)   [place] [facility] [place], [place] T, NP   7 months ago Left nephrolithiasis   [place] [facility] Mecum, Rocky BRAVO, PA-C   9 months ago Community acquired pneumonia of right lower lobe of lung   [place] [facility] [place], North T, NP   10 months ago AKI (acute kidney injury) (HCC)   [place] [facility] [place], Melanie T, NP       Future Appointments             In 3 weeks Cannady,  Jolene T, NP [place] [facility], [name]            Passed - CBC within normal limits and completed in the last 12 months    WBC  Date Value Ref Range Status  07/23/2023 5.2 3.4 - 10.8 x10E3/uL Final  07/18/2023 6.2 4.0 - 10.5 K/uL Final   RBC  Date Value Ref Range Status  07/23/2023 4.89 4.14 - 5.80 x10E6/uL Final  07/18/2023 5.77 4.22 - 5.81 MIL/uL Final   Hemoglobin  Date Value Ref Range Status  07/23/2023 14.8 13.0 - 17.7 g/dL Final   Hematocrit  Date Value Ref Range Status  07/23/2023 45.1 37.5 - 51.0 % Final   MCHC  Date Value Ref Range Status  07/23/2023 32.8 31.5 - 35.7 g/dL Final  88/96/7975 65.2 30.0 - 36.0 g/dL Final   [facility]  Date Value Ref Range Status  07/23/2023 30.3 26.6 - 33.0 pg Final  07/18/2023 31.2 26.0 - 34.0 pg Final   MCV  Date Value Ref Range Status  07/23/2023 92 79 - 97 fL Final  08/16/2014 95 80 - 100 fL Final   No results found for: PLTCOUNTKUC, LABPLAT, POCPLA RDW  Date Value Ref Range Status  07/23/2023 13.1 11.6 - 15.4 % Final  08/16/2014 13.4 11.5 - 14.5 % Final         Signed Prescriptions Disp Refills   pantoprazole  (PROTONIX ) 20 MG tablet 90 tablet 01    Sig: TAKE 1 TABLET EVERY DAY (DISCONTINUE 40MG  DOSE)     Gastroenterology: Proton Pump Inhibitors Passed - 09/27/2023 10:51 AM      Passed - Valid encounter within last 12 months    Recent Outpatient Visits           2 months ago Type 2 diabetes mellitus with proteinuria (HCC)   [place] [facility] [place], [place] T, NP   5 months ago Type 2 diabetes mellitus with proteinuria (HCC)   [place] [facility] [place], Melanie T, NP   7 months ago Left nephrolithiasis   [place] [facility] Mecum, Rocky BRAVO, PA-C   9 months ago Community acquired pneumonia of right lower lobe of lung   [place] [facility] [place], [place] T, NP   10 months ago AKI (acute kidney injury) (HCC)   [place]  [facility] [place] Melanie DASEN, NP       Future Appointments             In 3 weeks Cannady, Jolene T, NP [place] [facility], [name]            Refused Prescriptions Disp Refills   ramipril  (ALTACE ) 1.25 MG capsule [Pharmacy Med Name: Ramipril  Oral Capsule 1.25 MG] 90 capsule     Sig: TAKE 1 CAPSULE EVERY DAY     Cardiovascular:  ACE Inhibitors Failed - 09/27/2023 10:51 AM      Failed - Cr in normal range and within 180 days    Creatinine  Date Value Ref Range Status  09/20/2014 1.10 0.60 - 1.30 mg/dL Final   Creatinine, Ser  Date Value Ref Range Status  08/23/2023 1.73 (H) 0.76 - 1.27 mg/dL Final         Passed - K in normal range and within 180 days    Potassium  Date Value Ref Range Status  08/23/2023 4.4 3.5 - 5.2 mmol/L Final  08/16/2014 4.9 3.5 - 5.1 mmol/L Final         Passed - Patient is not pregnant      Passed - Last BP in normal range    BP Readings from Last 1 Encounters:  07/23/23 (!) 102/59         Passed - Valid encounter within last 6 months    Recent Outpatient Visits           2 months ago Type 2 diabetes mellitus with proteinuria (HCC)   [place] [facility] [place], Nedrow T, NP   5 months ago Type 2 diabetes mellitus with proteinuria (HCC)   [place] [facility] [place], Melanie T, NP   7 months ago Left nephrolithiasis   [place] [facility] Mecum, Rocky BRAVO, PA-C   9 months ago Community acquired pneumonia of right lower lobe of lung   [place] [facility] [place], Oakville T, NP   10 months ago AKI (acute kidney injury) (HCC)   [place] [facility] [place], Melanie DASEN, NP       Future Appointments             In 3 weeks Cannady, Melanie DASEN, NP [place] [facility], [name]

## 2023-10-01 ENCOUNTER — Telehealth: Payer: Self-pay

## 2023-10-01 NOTE — Telephone Encounter (Signed)
PAP: PAP application for Farxiga, AstraZeneca (AZ&Me) has been mailed to pt's home address on file. Will fax provider portion of application to provider's office when pt's portion is received.  Please note I have faxed provider portion of application to Dr. Nena Polio office for review at [facility].

## 2023-10-03 ENCOUNTER — Other Ambulatory Visit: Payer: Self-pay | Admitting: Nurse Practitioner

## 2023-10-04 NOTE — Telephone Encounter (Signed)
Requested medication (s) are due for refill today:   Requested medication (s) are on the active medication list: No  Last refill:  08/03/23  Future visit scheduled: Yes  Notes to clinic:  Medication not on med list.    Requested Prescriptions  Pending Prescriptions Disp Refills   clopidogrel (PLAVIX) 75 MG tablet [Pharmacy Med Name: Clopidogrel Bisulfate Oral Tablet 75 MG] 90 tablet 3    Sig: TAKE 1 TABLET EVERY DAY     Hematology: Antiplatelets - clopidogrel Failed - 10/04/2023  2:06 PM      Failed - Cr in normal range and within 360 days    Creatinine  Date Value Ref Range Status  09/20/2014 1.10 0.60 - 1.30 mg/dL Final   Creatinine, Ser  Date Value Ref Range Status  08/23/2023 1.73 (H) 0.76 - 1.27 mg/dL Final         Passed - HCT in normal range and within 180 days    Hematocrit  Date Value Ref Range Status  07/23/2023 45.1 37.5 - 51.0 % Final         Passed - HGB in normal range and within 180 days    Hemoglobin  Date Value Ref Range Status  07/23/2023 14.8 13.0 - 17.7 g/dL Final         Passed - PLT in normal range and within 180 days    Platelets  Date Value Ref Range Status  07/23/2023 243 150 - 450 x10E3/uL Final         Passed - Valid encounter within last 6 months    Recent Outpatient Visits           2 months ago Type 2 diabetes mellitus with proteinuria (HCC)   [place] [facility] [place], [place] T, NP   6 months ago Type 2 diabetes mellitus with proteinuria (HCC)   [place] [facility] [place], Corrie Dandy T, NP   8 months ago Left nephrolithiasis   [place] [facility] Mecum, Oswaldo Conroy, PA-C   9 months ago Community acquired pneumonia of right lower lobe of lung   [place] [facility] [place], Hoyt T, NP   10 months ago AKI (acute kidney injury) (HCC)   [place] [facility] [place], Dorie Rank, NP       Future Appointments             In 2 weeks Cannady, Corrie Dandy T,  NP [place] [facility], [name]            Signed Prescriptions Disp Refills   amitriptyline (ELAVIL) 10 MG tablet 360 tablet 0    Sig: TAKE 4 TABLETS AT BEDTIME     Psychiatry:  Antidepressants - Heterocyclics (TCAs) Passed - 10/04/2023  2:06 PM      Passed - Valid encounter within last 6 months    Recent Outpatient Visits           2 months ago Type 2 diabetes mellitus with proteinuria (HCC)   [place] [facility] [place], B[place] T, NP   6 months ago Type 2 diabetes mellitus with proteinuria (HCC)   [place] [facility] [place], [place] T, NP   8 months ago Left nephrolithiasis   [place] [facility] Mecum, Oswaldo Conroy, PA-C   9 months ago Community acquired pneumonia of right lower lobe of lung   [place] [facility] [place], Brandon T, NP   10 months ago AKI (acute kidney  injury) (HCC)   [facility] [place], Corrie Dandy T, NP       Future Appointments             In 2 weeks Cannady, Dorie Rank, NP [place] [facility], [name]             atenolol (TENORMIN) 50 MG tablet 90 tablet 0    Sig: TAKE 1 TABLET EVERY DAY     Cardiovascular: Beta Blockers 2 Failed - 10/04/2023  2:06 PM      Failed - Cr in normal range and within 360 days    Creatinine  Date Value Ref Range Status  09/20/2014 1.10 0.60 - 1.30 mg/dL Final   Creatinine, Ser  Date Value Ref Range Status  08/23/2023 1.73 (H) 0.76 - 1.27 mg/dL Final         Passed - Last BP in normal range    BP Readings from Last 1 Encounters:  07/23/23 (!) 102/59         Passed - Last Heart Rate in normal range    Pulse Readings from Last 1 Encounters:  07/23/23 (!) 59         Passed - Valid encounter within last 6 months    Recent Outpatient Visits           2 months ago Type 2 diabetes mellitus with proteinuria (HCC)   [place] [facility] [place], [place] T, NP   6 months ago Type 2  diabetes mellitus with proteinuria (HCC)   [place] [facility] [place], Corrie Dandy T, NP   8 months ago Left nephrolithiasis   [place] [facility] Mecum, Oswaldo Conroy, PA-C   9 months ago Community acquired pneumonia of right lower lobe of lung   [place] [facility] [place], Keota T, NP   10 months ago AKI (acute kidney injury) (HCC)   [place] [facility] [place], Corrie Dandy T, NP       Future Appointments             In 2 weeks Cannady, Dorie Rank, NP [place] [facility], [name]             omega-3 acid ethyl esters (LOVAZA) 1 g capsule 360 capsule 0    Sig: TAKE 2 CAPSULES TWICE DAILY     Endocrinology:  Nutritional Agents - omega-3 acid ethyl esters Failed - 10/04/2023  2:06 PM      Failed - Lipid Panel in normal range within the last 12 months    Cholesterol, Total  Date Value Ref Range Status  07/23/2023 146 100 - 199 mg/dL Final   LDL Chol Calc (NIH)  Date Value Ref Range Status  07/23/2023 60 0 - 99 mg/dL Final   HDL  Date Value Ref Range Status  07/23/2023 28 (L) >39 mg/dL Final   Triglycerides  Date Value Ref Range Status  07/23/2023 372 (H) 0 - 149 mg/dL Final         Passed - Valid encounter within last 12 months    Recent Outpatient Visits           2 months ago Type 2 diabetes mellitus with proteinuria (HCC)   [place] [facility] [place], [place] T, NP   6 months ago Type 2 diabetes mellitus with proteinuria (HCC)   [place] [facility] [employer], Corrie Dandy T, NP   8 months ago Left nephrolithiasis   [place]  [facility] Mecum, Oswaldo Conroy, PA-C   9 months ago Community acquired pneumonia of right lower lobe of lung   [place] [facility] [place], Canfield T, NP   10 months ago AKI (acute kidney injury) (HCC)   [place] [facility] [place], Corrie Dandy T, NP       Future Appointments             In 2 weeks  Cannady, Dorie Rank, NP [place] [facility], [name]             atorvastatin (LIPITOR) 80 MG tablet 90 tablet 0    Sig: TAKE 1 TABLET EVERY DAY     Cardiovascular:  Antilipid - Statins Failed - 10/04/2023  2:06 PM      Failed - Lipid Panel in normal range within the last 12 months    Cholesterol, Total  Date Value Ref Range Status  07/23/2023 146 100 - 199 mg/dL Final   LDL Chol Calc (NIH)  Date Value Ref Range Status  07/23/2023 60 0 - 99 mg/dL Final   HDL  Date Value Ref Range Status  07/23/2023 28 (L) >39 mg/dL Final   Triglycerides  Date Value Ref Range Status  07/23/2023 372 (H) 0 - 149 mg/dL Final         Passed - Patient is not pregnant      Passed - Valid encounter within last 12 months    Recent Outpatient Visits           2 months ago Type 2 diabetes mellitus with proteinuria (HCC)   [place] [facility] [place], Laurel T, NP   6 months ago Type 2 diabetes mellitus with proteinuria (HCC)   [place] [facility] [place], Marquez T, NP   8 months ago Left nephrolithiasis   [place] [facility] Mecum, Oswaldo Conroy, PA-C   9 months ago Community acquired pneumonia of right lower lobe of lung   [place] [facility] [place], [place] T, NP   10 months ago AKI (acute kidney injury) (HCC)   [place] [facility] [place], Dorie Rank, NP       Future Appointments             In 2 weeks Cannady, Dorie Rank, NP [facility] [place], [name]

## 2023-10-04 NOTE — Telephone Encounter (Signed)
Requested Prescriptions  Pending Prescriptions Disp Refills   amitriptyline (ELAVIL) 10 MG tablet [Pharmacy Med Name: Amitriptyline HCl Oral Tablet 10 MG] 360 tablet 0    Sig: TAKE 4 TABLETS AT BEDTIME     Psychiatry:  Antidepressants - Heterocyclics (TCAs) Passed - 10/04/2023  2:05 PM      Passed - Valid encounter within last 6 months    Recent Outpatient Visits           2 months ago Type 2 diabetes mellitus with proteinuria (HCC)   [place] [facility] [place], [place] T, NP   6 months ago Type 2 diabetes mellitus with proteinuria (HCC)   [place] [facility] [place], [place] T, NP   8 months ago Left nephrolithiasis   [place] [facility] Mecum, Oswaldo Conroy, PA-C   9 months ago Community acquired pneumonia of right lower lobe of lung   [place] [facility] [place], Carnegie T, NP   10 months ago AKI (acute kidney injury) (HCC)   [place] [facility] [place], Corrie Dandy T, NP       Future Appointments             In 2 weeks Cannady, Dorie Rank, NP [place] [facility], [name]             atenolol (TENORMIN) 50 MG tablet [Pharmacy Med Name: Atenolol Oral Tablet 50 MG] 90 tablet 0    Sig: TAKE 1 TABLET EVERY DAY     Cardiovascular: Beta Blockers 2 Failed - 10/04/2023  2:05 PM      Failed - Cr in normal range and within 360 days    Creatinine  Date Value Ref Range Status  09/20/2014 1.10 0.60 - 1.30 mg/dL Final   Creatinine, Ser  Date Value Ref Range Status  08/23/2023 1.73 (H) 0.76 - 1.27 mg/dL Final         Passed - Last BP in normal range    BP Readings from Last 1 Encounters:  07/23/23 (!) 102/59         Passed - Last Heart Rate in normal range    Pulse Readings from Last 1 Encounters:  07/23/23 (!) 59         Passed - Valid encounter within last 6 months    Recent Outpatient Visits           2 months ago Type 2 diabetes mellitus with proteinuria (HCC)   [place] [facility] [place], [place]u T, NP   6 months ago Type 2 diabetes mellitus with proteinuria (HCC)   [place] [facility] [place], Summit T, NP   8 months ago Left nephrolithiasis   [place] [facility] Mecum, Oswaldo Conroy, PA-C   9 months ago Community acquired pneumonia of right lower lobe of lung   [place] [facility] [place], Selman T, NP   10 months ago AKI (acute kidney injury) (HCC)    [facility] [place], Dorie Rank, NP       Future Appointments             In 2 weeks Cannady, Dorie Rank, NP  [facility], [name]             clopidogrel (PLAVIX) 75 MG tablet [Pharmacy Med Name: Clopidogrel Bisulfate Oral Tablet 75 MG] 90 tablet 3    Sig: TAKE 1 TABLET EVERY DAY     Hematology: Antiplatelets -  clopidogrel Failed - 10/04/2023  2:05 PM      Failed - Cr in normal range and within 360 days    Creatinine  Date Value Ref Range Status  09/20/2014 1.10 0.60 - 1.30 mg/dL Final   Creatinine, Ser  Date Value Ref Range Status  08/23/2023 1.73 (H) 0.76 - 1.27 mg/dL Final         Passed - HCT in normal range and within 180 days    Hematocrit  Date Value Ref Range Status  07/23/2023 45.1 37.5 - 51.0 % Final         Passed - HGB in normal range and within 180 days    Hemoglobin  Date Value Ref Range Status  07/23/2023 14.8 13.0 - 17.7 g/dL Final         Passed - PLT in normal range and within 180 days    Platelets  Date Value Ref Range Status  07/23/2023 243 150 - 450 x10E3/uL Final         Passed - Valid encounter within last 6 months    Recent Outpatient Visits           2 months ago Type 2 diabetes mellitus with proteinuria (HCC)   [place] [facility] [place], [place] T, NP   6 months ago Type 2 diabetes mellitus with proteinuria (HCC)   [place] [facility] [place], Corrie Dandy T, NP   8 months ago Left nephrolithiasis   [place] [facility] Mecum, Oswaldo Conroy, PA-C   9 months ago Community acquired pneumonia of right lower lobe of lung   [place] [facility] [place], McNab T, NP   10 months ago AKI (acute kidney injury) (HCC)   [place] [facility] [place], Dorie Rank, NP       Future Appointments             In 2 weeks Cannady, Dorie Rank, NP [place] [facility], [name]             omega-3 acid ethyl esters (LOVAZA) 1 g capsule [Pharmacy Med Name: Omega-3-acid Ethyl Esters Oral Capsule 1 GM] 360 capsule 0    Sig: TAKE 2 CAPSULES TWICE DAILY     Endocrinology:  Nutritional Agents - omega-3 acid ethyl esters Failed - 10/04/2023  2:05 PM      Failed - Lipid Panel in normal range within the last 12 months    Cholesterol, Total  Date Value Ref Range Status  07/23/2023 146 100 - 199 mg/dL Final   LDL Chol Calc (NIH)  Date Value Ref Range Status  07/23/2023 60 0 - 99 mg/dL Final   HDL  Date Value Ref Range Status  07/23/2023 28 (L) >39 mg/dL Final   Triglycerides  Date Value Ref Range Status  07/23/2023 372 (H) 0 - 149 mg/dL Final         Passed - Valid encounter within last 12 months    Recent Outpatient Visits           2 months ago Type 2 diabetes mellitus with proteinuria (HCC)   [place] [facility] [place], Elma T, NP   6 months ago Type 2 diabetes mellitus with proteinuria (HCC)   [place] [facility] Cecilia, [place] T, NP   8 months ago Left nephrolithiasis   [place] [facility] Mecum, Oswaldo Conroy, PA-C   9 months ago Community acquired pneumonia of right lower lobe of lung  [place] [facility] [place], [place] T, NP   10 months ago AKI (acute kidney injury) (HCC)   [place] [facility] [place], Corrie Dandy T, NP       Future Appointments             In 2 weeks Cannady, Dorie Rank, NP [place] [facility], [name]             atorvastatin (LIPITOR) 80 MG  tablet [Pharmacy Med Name: Atorvastatin Calcium Oral Tablet 80 MG] 90 tablet 0    Sig: TAKE 1 TABLET EVERY DAY     Cardiovascular:  Antilipid - Statins Failed - 10/04/2023  2:05 PM      Failed - Lipid Panel in normal range within the last 12 months    Cholesterol, Total  Date Value Ref Range Status  07/23/2023 146 100 - 199 mg/dL Final   LDL Chol Calc (NIH)  Date Value Ref Range Status  07/23/2023 60 0 - 99 mg/dL Final   HDL  Date Value Ref Range Status  07/23/2023 28 (L) >39 mg/dL Final   Triglycerides  Date Value Ref Range Status  07/23/2023 372 (H) 0 - 149 mg/dL Final         Passed - Patient is not pregnant      Passed - Valid encounter within last 12 months    Recent Outpatient Visits           2 months ago Type 2 diabetes mellitus with proteinuria (HCC)   [place] [facility] [place], [place] T, NP   6 months ago Type 2 diabetes mellitus with proteinuria (HCC)   [place] [facility] [place], Corrie Dandy T, NP   8 months ago Left nephrolithiasis   [place] [facility] Mecum, Oswaldo Conroy, PA-C   9 months ago Community acquired pneumonia of right lower lobe of lung   [place] [facility] [place], Ponce T, NP   10 months ago AKI (acute kidney injury) (HCC)   [place] [facility] [place], Dorie Rank, NP       Future Appointments             In 2 weeks Cannady, Dorie Rank, NP [place] [facility], [name]

## 2023-10-07 NOTE — Telephone Encounter (Signed)
Not on current med list. Is the patient supposed to be on this medication?

## 2023-10-08 NOTE — Telephone Encounter (Signed)
Called and spoke to patient. He is not taking the plavix anymore.

## 2023-10-14 ENCOUNTER — Ambulatory Visit (INDEPENDENT_AMBULATORY_CARE_PROVIDER_SITE_OTHER): Payer: Medicare PPO | Admitting: Emergency Medicine

## 2023-10-14 VITALS — Ht 72.0 in | Wt 185.0 lb

## 2023-10-14 DIAGNOSIS — Z0000 Encounter for general adult medical examination without abnormal findings: Secondary | ICD-10-CM

## 2023-10-14 NOTE — Patient Instructions (Addendum)
Mr. Aumiller , Thank you for taking time to come for your Medicare Wellness Visit. I appreciate your ongoing commitment to your health goals. Please review the following plan we discussed and let me know if I can assist you in the future.   Referrals/Orders/Follow-Ups/Clinician Recommendations: Get the shingles vaccines at your convenience. Speak with Aura Dials, NP at your OV on 10/22/23 about when you need to repeat a colonoscopy. I have also sent her a note regarding this.  This is a list of the screening recommended for you and due dates:  Health Maintenance  Topic Date Due   COVID-19 Vaccine (5 - 2024-25 season) 05/16/2023   Zoster (Shingles) Vaccine (1 of 2) 10/18/2023*   Hemoglobin A1C  01/20/2024   Eye exam for diabetics  01/21/2024   Yearly kidney health urinalysis for diabetes  04/01/2024   Complete foot exam   07/22/2024   Yearly kidney function blood test for diabetes  08/22/2024   Medicare Annual Wellness Visit  10/13/2024   Colon Cancer Screening  12/14/2027   DTaP/Tdap/Td vaccine (4 - Td or Tdap) 06/16/2028   Pneumonia Vaccine  Completed   Flu Shot  Completed   Hepatitis C Screening  Completed   HPV Vaccine  Aged Out  *Topic was postponed. The date shown is not the original due date.    Advanced directives: (ACP Link)Information on Advanced Care Planning can be found at [facility] of [employer] Advance Health Care Directives Advance Health Care Directives ([email or web address]   Once you have completed the forms, please bring a copy of your health care power of attorney and living will to the office to be added to your chart at your convenience.   Next Medicare Annual Wellness Visit scheduled for next year: Yes, 10/26/24 @ 2:30pm (phone visit)

## 2023-10-14 NOTE — Progress Notes (Signed)
Subjective:   [name] is a 73 y.o. male who presents for Medicare Annual/Subsequent preventive examination.  This patient declined Interactive audio and Acupuncturist. Therefore the visit was completed with audio only.   Visit Complete: Virtual I connected with  [name] on 10/14/23 by a audio enabled telemedicine application and verified that I am speaking with the correct person using two identifiers.  Patient Location: Home  Provider Location: Home Office  I discussed the limitations of evaluation and management by telemedicine. The patient expressed understanding and agreed to proceed.  Vital Signs: Because this visit was a virtual/telehealth visit, some criteria may be missing or patient reported. Any vitals not documented were not able to be obtained and vitals that have been documented are patient reported.   Cardiac Risk Factors include: advanced age (>14men, >52 women);male gender;diabetes mellitus;dyslipidemia;hypertension;Other (see comment), Risk factor comments: CAD     Objective:    Today's Vitals   10/14/23 1416  Weight: 185 lb (83.9 kg)  Height: 6' (1.829 m)   Body mass index is 25.09 kg/m.     10/14/2023    2:31 PM 07/18/2023    2:26 PM 07/15/2023    7:21 AM 11/11/2022    8:00 PM 11/11/2022   11:29 AM 11/11/2022    9:00 AM 05/19/2022    9:13 AM  Advanced Directives  Does Patient Have a Medical Advance Directive? No No No No No No No  Would patient like information on creating a medical advance directive? Yes (MAU/Ambulatory/Procedural Areas - Information given)  No - Patient declined No - Patient declined   No - Patient declined    Current Medications (verified) Outpatient Encounter Medications as of 10/14/2023  Medication Sig   ACCU-CHEK GUIDE test strip 1 each by Other route 2 times daily at 12 noon and 4 pm.   albuterol (VENTOLIN HFA) 108 (90 Base) MCG/ACT inhaler Inhale 2 puffs into the lungs every 6 (six) hours as  needed.   amitriptyline (ELAVIL) 10 MG tablet TAKE 4 TABLETS AT BEDTIME   amLODipine (NORVASC) 5 MG tablet Take 1 tablet by mouth daily.   aspirin EC 81 MG tablet Take 81 mg by mouth daily.    atenolol (TENORMIN) 50 MG tablet TAKE 1 TABLET EVERY DAY   atorvastatin (LIPITOR) 80 MG tablet TAKE 1 TABLET EVERY DAY   Continuous Glucose Sensor (FREESTYLE LIBRE 3 SENSOR) MISC To check sugars regularly is on insulin Dx code E11.65, Z79.4   dapagliflozin propanediol (FARXIGA) 10 MG TABS tablet Take 10 mg by mouth daily.   ezetimibe (ZETIA) 10 MG tablet Take 1 tablet (10 mg total) by mouth daily.   fenofibrate (TRICOR) 145 MG tablet TAKE 1 TABLET EVERY DAY   glipiZIDE (GLUCOTROL XL) 10 MG 24 hr tablet Take 10 mg by mouth daily.   insulin degludec (TRESIBA FLEXTOUCH) 100 UNIT/ML FlexTouch Pen Inject 60 Units into the skin at bedtime.   metFORMIN (GLUCOPHAGE) 500 MG tablet TAKE 2 TABLETS TWICE DAILY WITH MEALS   neomycin-polymyxin-dexameth (MAXITROL) 0.1 % OINT Place 1 Application into the left eye 4 (four) times daily.   nitroGLYCERIN (NITROSTAT) 0.4 MG SL tablet Place 0.4 mg under the tongue every 5 (five) minutes as needed.   omega-3 acid ethyl esters (LOVAZA) 1 g capsule TAKE 2 CAPSULES TWICE DAILY   pantoprazole (PROTONIX) 20 MG tablet TAKE 1 TABLET EVERY DAY (DISCONTINUE 40MG  DOSE)   prasugrel (EFFIENT) 10 MG TABS tablet Take 1 tablet (10 mg total) by mouth daily.  vitamin B-12 (CYANOCOBALAMIN) 1000 MCG tablet Take 1,000 mcg by mouth daily.   Continuous Glucose Receiver (FREESTYLE LIBRE 3 READER) DEVI Is on insulin to check blood sugar regularly with Freestyle. E11.65, Z79.4 (Patient not taking: Reported on 10/14/2023)   No facility-administered encounter medications on file as of 10/14/2023.    Allergies (verified) Trulicity [dulaglutide] and Penicillins   History: Past Medical History:  Diagnosis Date   Acid reflux 04/12/2012   Allergy    Arteriosclerosis of coronary artery 04/12/2012    Overview:  1.  Atherosclerotic coronary artery disease.    a.  normal LV function.  Coronary arteriography 5/98 revealed 50% LAD lesion, 95% lesion in large diagonal branch. Left circumflex was normal.  25% proximal RCA lesion.  50% lesion in the PDA.    b.  PTCA of the first diagonal and placement of three multi-link stents.    c.  recent ETT Cardiolite revealed low probability for ischemia.     d.  cardiac catheterization on 05/18/97 per Arnoldo Hooker revealed 80% LAD, 70% proximal, 90% mid first AL, 30% RCA.     e.  status post coronary bypass grafting x 2 per Ronnell Guadalajara.    f.  recurrent chest pain with exertion, equivocal ETT.    g.  repeat cardiac catheterization revealed an occluded vein graft.     h.  medical therapy.    i.  ETT Myoview showed borderline changes.     j.  cardiac catheterization 8/02 revealed no significant change in anatomy from previous cath.     k. cardiac cath 06/19/05 revealed patent left internal mammary artery to occluded LAD. Saphenous vein to D1 was chronically occ   Benign essential HTN 11/19/2015   Benign fibroma of prostate 03/05/2014   Cannot sleep 03/05/2014   Colon polyp 04/12/2012   COVID 08/2020   HLD (hyperlipidemia) 11/19/2015   Presence of artificial eye 04/12/2012   Spinal stenosis 04/12/2012   Type 2 diabetes mellitus (HCC) 03/05/2014   Past Surgical History:  Procedure Laterality Date   COLONOSCOPY WITH PROPOFOL N/A 12/13/2017   Procedure: COLONOSCOPY WITH PROPOFOL;  Surgeon: Christena Deem, MD;  Location: [facility] ENDOSCOPY;  Service: Endoscopy;  Laterality: N/A;   CORONARY ARTERY BYPASS GRAFT  1998   CORONARY STENT INTERVENTION N/A 07/15/2023   Procedure: CORONARY STENT INTERVENTION;  Surgeon: Marcina Millard, MD;  Location: [facility] INVASIVE CV LAB;  Service: Cardiovascular;  Laterality: N/A;   CORONARY STENT PLACEMENT  2006   JOINT REPLACEMENT Left    hip- 06/2019   left eye     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)   LEFT HEART CATH AND  CORONARY ANGIOGRAPHY Left 06/28/2018   Procedure: LEFT HEART CATH AND CORONARY ANGIOGRAPHY;  Surgeon: Dalia Heading, MD;  Location: [facility] INVASIVE CV LAB;  Service: Cardiovascular;  Laterality: Left;   LEFT HEART CATH AND CORS/GRAFTS ANGIOGRAPHY N/A 07/15/2023   Procedure: LEFT HEART CATH AND CORS/GRAFTS ANGIOGRAPHY;  Surgeon: Marcina Millard, MD;  Location: [facility] INVASIVE CV LAB;  Service: Cardiovascular;  Laterality: N/A;   lt knee cap     had to have lt knee cap replaced   Family History  Problem Relation Age of Onset   Hypertension Brother    Diabetes Brother    Heart disease Mother    Heart disease Father    Diabetes Daughter    Schizophrenia Maternal Grandmother    Heart disease Brother    Prostate cancer Neg Hx    Bladder Cancer Neg Hx  Kidney cancer Neg Hx    Social History   Socioeconomic History   Marital status: Married    Spouse name: Darel Hong   Number of children: 2   Years of education: Not on file   Highest education level: Not on file  Occupational History   Occupation: part time maintenance    Comment: [employer]  Tobacco Use   Smoking status: Former    Current packs/day: 0.00    Average packs/day: 2.0 packs/day for 35.0 years (70.0 ttl pk-yrs)    Types: Cigarettes    Start date: 1963    Quit date: 1998    Years since quitting: 27.0   Smokeless tobacco: Never  Vaping Use   Vaping status: Never Used  Substance and Sexual Activity   Alcohol use: Yes    Alcohol/week: 6.0 standard drinks of alcohol    Types: 6 Cans of beer per week    Comment: 2-3 beers 1-2 times per week   Drug use: No   Sexual activity: Yes  Other Topics Concern   Not on file  Social History Narrative   10/14/23 still works part-time maintenance (24-32 hours per week)   Social Drivers of Corporate investment banker Strain: Low Risk  (10/14/2023)   Overall Financial Resource Strain (CARDIA)    Difficulty of Paying Living Expenses: Not hard at all  Food Insecurity: No Food  Insecurity (10/14/2023)   Hunger Vital Sign    Worried About Running Out of Food in the Last Year: Never true    Ran Out of Food in the Last Year: Never true  Transportation Needs: No Transportation Needs (10/14/2023)   PRAPARE - Administrator, Civil Service (Medical): No    Lack of Transportation (Non-Medical): No  Physical Activity: Inactive (10/14/2023)   Exercise Vital Sign    Days of Exercise per Week: 0 days    Minutes of Exercise per Session: 0 min  Stress: No Stress Concern Present (10/14/2023)   [employer] of Occupational Health - Occupational Stress Questionnaire    Feeling of Stress : Not at all  Social Connections: Moderately Isolated (10/14/2023)   Social Connection and Isolation Panel [NHANES]    Frequency of Communication with Friends and Family: More than three times a week    Frequency of Social Gatherings with Friends and Family: More than three times a week    Attends Religious Services: Never    Database administrator or Organizations: No    Attends Engineer, structural: Never    Marital Status: Married    Tobacco Counseling Counseling given: Not Answered   Clinical Intake:  Pre-visit preparation completed: Yes  Pain : No/denies pain     BMI - recorded: 25.09 Nutritional Status: BMI 25 -29 Overweight Nutritional Risks: None Diabetes: Yes CBG done?: No (FBS 208 per patient) Did pt. bring in CBG monitor from home?: No  How often do you need to have someone help you when you read instructions, pamphlets, or other written materials from your doctor or pharmacy?: 1 - Never  Interpreter Needed?: No  Information entered by :: Tora Kindred, CMA   Activities of Daily Living    10/14/2023    2:19 PM 07/15/2023    6:53 AM  In your present state of health, do you have any difficulty performing the following activities:  Hearing? 0 0  Vision? 0 0  Difficulty concentrating or making decisions? 0 0  Walking or climbing stairs? 0    Dressing  or bathing? 0   Doing errands, shopping? 0   Preparing Food and eating ? N   Using the Toilet? N   In the past six months, have you accidently leaked urine? N   Do you have problems with loss of bowel control? N   Managing your Medications? N   Managing your Finances? N   Housekeeping or managing your Housekeeping? N     Patient Care Team: Marjie Skiff, NP as PCP - General (Nurse Practitioner) Zettie Pho, RPH (Inactive) (Pharmacist) Rodney Langton, RN as [facility] Care Management  Indicate any recent Medical Services you may have received from other than [facility] providers in the past year (date may be approximate).     Assessment:   This is a routine wellness examination for [facility].  Hearing/Vision screen Hearing Screening - Comments:: Denies hearing loss Vision Screening - Comments:: Gets eye exams, [facility], [place]   Goals Addressed             This Visit's Progress    DIET - INCREASE WATER INTAKE        Depression Screen    10/14/2023    2:30 PM 07/23/2023    2:10 PM 04/02/2023   10:00 AM 12/21/2022    3:38 PM 11/20/2022    4:19 PM 10/26/2022    4:24 PM 05/21/2022    8:20 AM  PHQ 2/9 Scores  PHQ - 2 Score 0 0 2 0 2 0 0  PHQ- 9 Score  1 5 0 6 4 3     Fall Risk    10/14/2023    2:33 PM 07/23/2023    2:10 PM 04/02/2023   10:00 AM 12/21/2022    3:38 PM 11/20/2022    4:19 PM  Fall Risk   Falls in the past year? 0 0 1 1 0  Number falls in past yr: 0 0 1 0 0  Injury with Fall? 0 0 1 0 0  Risk for fall due to : No Fall Risks No Fall Risks History of fall(s) History of fall(s) No Fall Risks  Follow up Falls prevention discussed Falls evaluation completed Falls evaluation completed Falls evaluation completed Falls evaluation completed    MEDICARE RISK AT HOME: Medicare Risk at Home Any stairs in or around the home?: Yes If so, are there any without handrails?: No Home free of loose throw rugs in walkways, pet beds, electrical  cords, etc?: Yes Adequate lighting in your home to reduce risk of falls?: Yes Life alert?: No Use of a cane, walker or w/c?: No Grab bars in the bathroom?: Yes Shower chair or bench in shower?: Yes (has shower chair, but doesn't need to use yet) Elevated toilet seat or a handicapped toilet?: No  TIMED UP AND GO:  Was the test performed?  No    Cognitive Function:        10/14/2023    2:34 PM 05/19/2022    9:14 AM 05/16/2021    9:08 AM 05/13/2020    9:04 AM  6CIT Screen  What Year? 0 points  0 points 0 points  What month? 0 points  0 points 0 points  What time? 0 points 0 points 0 points 0 points  Count back from 20 0 points 0 points 0 points 0 points  Months in reverse 0 points 0 points 0 points 0 points  Repeat phrase 0 points 0 points 4 points 2 points  Total Score 0 points  4 points 2 points  Immunizations Immunization History  Administered Date(s) Administered   Fluad Quad(high Dose 65+) 08/14/2019, 06/04/2020, 07/11/2021, 07/31/2022   Fluad Trivalent(High Dose 65+) 07/12/2018   Influenza, High Dose Seasonal PF 05/31/2015, 06/16/2017, 07/12/2018, 07/21/2023   Influenza-Unspecified 06/20/2014, 06/26/2015, 08/27/2016, 09/11/2016   PFIZER Comirnaty(Gray Top)Covid-19 Tri-Sucrose Vaccine 11/04/2019, 11/26/2019   PFIZER(Purple Top)SARS-COV-2 Vaccination 11/04/2019, 11/26/2019   Pneumococcal Conjugate-13 06/20/2014   Pneumococcal Polysaccharide-23 06/21/2015, 06/25/2015   Td 09/08/2008   Tdap 05/30/2012, 06/16/2018    TDAP status: Up to date  Flu Vaccine status: Up to date  Pneumococcal vaccine status: Up to date  Covid-19 vaccine status: Declined, Education has been provided regarding the importance of this vaccine but patient still declined. Advised may receive this vaccine at local pharmacy or Health Dept.or vaccine clinic. Aware to provide a copy of the vaccination record if obtained from local pharmacy or Health Dept. Verbalized acceptance and  understanding.  Qualifies for Shingles Vaccine? Yes   Zostavax completed No   Shingrix Completed?: No.    Education has been provided regarding the importance of this vaccine. Patient has been advised to call insurance company to determine out of pocket expense if they have not yet received this vaccine. Advised may also receive vaccine at local pharmacy or Health Dept. Verbalized acceptance and understanding.  Screening Tests Health Maintenance  Topic Date Due   COVID-19 Vaccine (5 - 2024-25 season) 05/16/2023   Zoster Vaccines- Shingrix (1 of 2) 10/18/2023 (Originally 11/20/1998)   HEMOGLOBIN A1C  01/20/2024   OPHTHALMOLOGY EXAM  01/21/2024   Diabetic kidney evaluation - Urine ACR  04/01/2024   FOOT EXAM  07/22/2024   Diabetic kidney evaluation - eGFR measurement  08/22/2024   Medicare Annual Wellness (AWV)  10/13/2024   Colonoscopy  12/14/2027   DTaP/Tdap/Td (4 - Td or Tdap) 06/16/2028   Pneumonia Vaccine 18+ Years old  Completed   INFLUENZA VACCINE  Completed   Hepatitis C Screening  Completed   HPV VACCINES  Aged Out    Health Maintenance  Health Maintenance Due  Topic Date Due   COVID-19 Vaccine (5 - 2024-25 season) 05/16/2023    Colorectal cancer screening: Type of screening: Colonoscopy. Completed 12/13/17. Repeat every 10 years   Lung Cancer Screening: (Low Dose CT Chest recommended if Age 27-80 years, 20 pack-year currently smoking OR have quit w/in 15years.) does not qualify.   Lung Cancer Screening Referral: n/a  Additional Screening:  Hepatitis C Screening: does not qualify; Completed 09/03/20  Vision Screening: Recommended annual ophthalmology exams for early detection of glaucoma and other disorders of the eye. Is the patient up to date with their annual eye exam?  Yes  Who is the provider or what is the name of the office in which the patient attends annual eye exams? [facility] If pt is not established with a provider, would they like to be  referred to a provider to establish care? No .   Dental Screening: Recommended annual dental exams for proper oral hygiene  Diabetic Foot Exam: Diabetic Foot Exam: Completed 09/21/22  Community Resource Referral / Chronic Care Management: CRR required this visit?  No   CCM required this visit?  No     Plan:     I have personally reviewed and noted the following in the patient's chart:   Medical and social history Use of alcohol, tobacco or illicit drugs  Current medications and supplements including opioid prescriptions. Patient is not currently taking opioid prescriptions. Functional ability and status Nutritional status Physical activity  Advanced directives List of other physicians Hospitalizations, surgeries, and ER visits in previous 12 months Vitals Screenings to include cognitive, depression, and falls Referrals and appointments  In addition, I have reviewed and discussed with patient certain preventive protocols, quality metrics, and best practice recommendations. A written personalized care plan for preventive services as well as general preventive health recommendations were provided to patient.     Tora Kindred, CMA   10/14/2023   After Visit Summary: (MyChart) Due to this being a telephonic visit, the after visit summary with patients personalized plan was offered to patient via MyChart   Nurse Notes:  Needs shingles vaccine Declined DM & Nutrition education Declined Covid vaccine  Patient's last colonoscopy was 12/13/17, 2 polyps removed) tubular adenoma per bx results). Patient is in for 10 year recall. Should he have a colonoscopy prior to 10 years due to tubular adenomatous polyp? Could not find any documentation from GI on when to repeat.

## 2023-10-15 DIAGNOSIS — E1169 Type 2 diabetes mellitus with other specified complication: Secondary | ICD-10-CM | POA: Diagnosis not present

## 2023-10-15 DIAGNOSIS — Z794 Long term (current) use of insulin: Secondary | ICD-10-CM | POA: Diagnosis not present

## 2023-10-15 DIAGNOSIS — E1165 Type 2 diabetes mellitus with hyperglycemia: Secondary | ICD-10-CM | POA: Diagnosis not present

## 2023-10-15 DIAGNOSIS — E1159 Type 2 diabetes mellitus with other circulatory complications: Secondary | ICD-10-CM | POA: Diagnosis not present

## 2023-10-15 DIAGNOSIS — I152 Hypertension secondary to endocrine disorders: Secondary | ICD-10-CM | POA: Diagnosis not present

## 2023-10-15 DIAGNOSIS — E785 Hyperlipidemia, unspecified: Secondary | ICD-10-CM | POA: Diagnosis not present

## 2023-10-15 NOTE — Telephone Encounter (Signed)
Received call from patient, he did not receive PAP application for Marcelline Deist that was mailed to him. I have put another in the mail and verified address.

## 2023-10-17 NOTE — Patient Instructions (Signed)

## 2023-10-18 ENCOUNTER — Telehealth: Payer: Self-pay

## 2023-10-18 NOTE — Telephone Encounter (Signed)
PAP: PAP application for Farxiga,(AZ&Me)  has been mailed to pt's home address on file.     Please note I have faxed provider portion  of application to Dr. Nena Polio  office for review at [facility].

## 2023-10-18 NOTE — Telephone Encounter (Signed)
-----   Message from Montel Culver sent at 10/18/2023  8:57 AM EST ----- Another Dossie Arbour, I remailed his app to him Friday

## 2023-10-22 ENCOUNTER — Ambulatory Visit (INDEPENDENT_AMBULATORY_CARE_PROVIDER_SITE_OTHER): Payer: Medicare PPO | Admitting: Nurse Practitioner

## 2023-10-22 ENCOUNTER — Encounter: Payer: Self-pay | Admitting: Nurse Practitioner

## 2023-10-22 VITALS — BP 135/64 | HR 55 | Temp 97.8°F | Ht 72.0 in | Wt 182.2 lb

## 2023-10-22 DIAGNOSIS — R809 Proteinuria, unspecified: Secondary | ICD-10-CM

## 2023-10-22 DIAGNOSIS — E1129 Type 2 diabetes mellitus with other diabetic kidney complication: Secondary | ICD-10-CM | POA: Diagnosis not present

## 2023-10-22 DIAGNOSIS — Z794 Long term (current) use of insulin: Secondary | ICD-10-CM | POA: Diagnosis not present

## 2023-10-22 DIAGNOSIS — I152 Hypertension secondary to endocrine disorders: Secondary | ICD-10-CM | POA: Diagnosis not present

## 2023-10-22 DIAGNOSIS — E1169 Type 2 diabetes mellitus with other specified complication: Secondary | ICD-10-CM | POA: Diagnosis not present

## 2023-10-22 DIAGNOSIS — E781 Pure hyperglyceridemia: Secondary | ICD-10-CM | POA: Diagnosis not present

## 2023-10-22 DIAGNOSIS — I2510 Atherosclerotic heart disease of native coronary artery without angina pectoris: Secondary | ICD-10-CM | POA: Diagnosis not present

## 2023-10-22 DIAGNOSIS — E1165 Type 2 diabetes mellitus with hyperglycemia: Secondary | ICD-10-CM

## 2023-10-22 DIAGNOSIS — I252 Old myocardial infarction: Secondary | ICD-10-CM | POA: Diagnosis not present

## 2023-10-22 DIAGNOSIS — E1122 Type 2 diabetes mellitus with diabetic chronic kidney disease: Secondary | ICD-10-CM

## 2023-10-22 DIAGNOSIS — N183 Chronic kidney disease, stage 3 unspecified: Secondary | ICD-10-CM | POA: Diagnosis not present

## 2023-10-22 DIAGNOSIS — F5101 Primary insomnia: Secondary | ICD-10-CM

## 2023-10-22 DIAGNOSIS — E1159 Type 2 diabetes mellitus with other circulatory complications: Secondary | ICD-10-CM | POA: Diagnosis not present

## 2023-10-22 DIAGNOSIS — E785 Hyperlipidemia, unspecified: Secondary | ICD-10-CM | POA: Diagnosis not present

## 2023-10-22 LAB — BAYER DCA HB A1C WAIVED: HB A1C (BAYER DCA - WAIVED): 9 % — ABNORMAL HIGH (ref 4.8–5.6)

## 2023-10-22 LAB — MICROALBUMIN, URINE WAIVED
Creatinine, Urine Waived: 300 mg/dL (ref 10–300)
Microalb, Ur Waived: 80 mg/L — ABNORMAL HIGH (ref 0–19)

## 2023-10-22 MED ORDER — PANTOPRAZOLE SODIUM 20 MG PO TBEC: DELAYED_RELEASE_TABLET | ORAL | 4 refills | Status: DC

## 2023-10-22 MED ORDER — OMEGA-3-ACID ETHYL ESTERS 1 G PO CAPS
2.0000 | ORAL_CAPSULE | Freq: Two times a day (BID) | ORAL | 4 refills | Status: AC
Start: 2023-10-22 — End: ?

## 2023-10-22 MED ORDER — ATENOLOL 50 MG PO TABS: 50.0000 mg | ORAL_TABLET | Freq: Every day | ORAL | 4 refills | Status: DC

## 2023-10-22 MED ORDER — FENOFIBRATE 145 MG PO TABS: 145.0000 mg | ORAL_TABLET | Freq: Every day | ORAL | 4 refills | Status: DC

## 2023-10-22 MED ORDER — ATORVASTATIN CALCIUM 80 MG PO TABS: 80.0000 mg | ORAL_TABLET | Freq: Every day | ORAL | 4 refills | Status: DC

## 2023-10-22 NOTE — Assessment & Plan Note (Signed)
 Chronic, ongoing.  Continue Atorvastatin, Lovaza, and Fenofibrate, tolerating at this time.  Adjust dose as needed.  Lipid panel today.  Insurance would not cover Repatha.

## 2023-10-22 NOTE — Assessment & Plan Note (Signed)
 Chronic, ongoing CKD 3a to 3 b on checks.  Followed by endocrinology with A1c 9% today (trend down) and urine ALB 80 February 2025 -- continue Ramipril  for kidney protection.  Continue current medication regimen as prescribed by endocrinology.  PharmD collaboration present. Recommend he check BS at least twice a day, fasting and 2 hours after a meal.   - Foot and eye exams up to date - Vaccinations up to date - ACE and Statin on board.

## 2023-10-22 NOTE — Assessment & Plan Note (Signed)
 Acute and improved at this time. Continue current medication regimen as ordered by cardiology.  To stay on ASA indefinitely and Prasugel continues.

## 2023-10-22 NOTE — Assessment & Plan Note (Signed)
 Chronic, stable at present.  Continue current medication regimen and collaboration with cardiology.

## 2023-10-22 NOTE — Assessment & Plan Note (Signed)
 Chronic, stable.  BP at goal on check today for age.  Will continue current medication regimen and cardiology collaboration.  Recommend he monitor BP at home at least a few mornings a week and document for provider + focus on DASH diet.  Could consider addition of Amlodipine  if elevation in SBP noted.  Urine ALB 80 February 2025, continue Ramipril  for kidney protection.  LABS: CMP, TSH, and urine ALB.

## 2023-10-22 NOTE — Progress Notes (Signed)
 BP 135/64 (BP Location: Left Arm, Cuff Size: Normal)   Pulse (!) 55   Temp 97.8 F (36.6 C) (Oral)   Ht 6' (1.829 m)   Wt 182 lb 3.2 oz (82.6 kg)   SpO2 98%   BMI 24.71 kg/m    Subjective:    Patient ID: [name], male    DOB: [birth year], 73 y.o.   MRN: [id number]  HPI: [name] is a 73 y.o. male  Chief Complaint  Patient presents with   Chronic Kidney Disease   Diabetes   Hyperlipidemia   Hypertension   DIABETES Was diagnosed >10 years ago.  Saw Dr. Cherilyn last 10/15/23, last A1c 9.3% in November.  Increased Glipizide  to 20 MG daily XL dosing.  Currently using Tresiba, Metformin , Glipizide .  They are trying to get Farxiga for patient with assistance. Low B12 levels, is taking supplement daily. Hypoglycemic episodes:no Polydipsia/polyuria: no Visual disturbance: no Chest pain: no Paresthesias: no Glucose Monitoring: yes             Accucheck frequency: Daily             Fasting glucose: 108 this morning -- on average has been 100 in morning             Post prandial:             Evening:             Before meals: Taking Insulin ?: yes             Long acting insulin : Tresiba 60 units             Short acting insulin : Blood Pressure Monitoring: not checking Retinal Examination: Up To Date -- [facility] Foot Exam: Up to Date Pneumovax: Up to Date Influenza: Up to Date Aspirin : yes    CHRONIC KIDNEY DISEASE (CKD 3b) CKD status: stable   Medications renally dose: yes Previous renal evaluation: no Pneumovax:  Up to Date Influenza Vaccine:  Up to Date     Latest Ref Rng & Units 08/23/2023    8:21 AM 07/23/2023    2:11 PM 07/18/2023    2:29 PM  BMP  Glucose 70 - 99 mg/dL 86  836  775   BUN 8 - 27 mg/dL 22  27  26    Creatinine 0.76 - 1.27 mg/dL 8.26  8.13  8.11   BUN/Creat Ratio 10 - 24 13  15     Sodium 134 - 144 mmol/L 144  137  140   Potassium 3.5 - 5.2 mmol/L 4.4  5.0  4.8   Chloride 96 - 106 mmol/L 108  101  107   CO2 20 - 29 mmol/L  23  20  24    Calcium  8.6 - 10.2 mg/dL 9.1  9.4  89.7     HYPERTENSION / HYPERLIPIDEMIA Taking Ramipril  1.25 MG, Atenolol  50 MG, ASA, Fenofibrate , Atorvastatin , Lovaza , and Plavix . Sees [facility] cardiology next Thursday, to continue dual anti-platelet therapy.  Last echo was 06/17/23 with EF 55%. History of CABG in 1998, stent in 2006, catheterization in 2011.  No NTG use recently.  Quit smoking in 1998, smoked about 2 PPD.  Low magnesium levels past labs, taking supplement. Satisfied with current treatment? yes Duration of hypertension: chronic BP monitoring frequency: not checking BP range:  BP medication side effects: no Duration of hyperlipidemia: chronic Cholesterol medication side effects: no Cholesterol supplements: none Medication compliance: good compliance Aspirin : yes Recent stressors: no Recurrent headaches: no Visual  changes: no Palpitations: no Dyspnea: occasional, he plans to tell cardiology next week, usually when downstairs Chest pain: x 1 episode at night and fell asleep Lower extremity edema: no Dizzy/lightheaded: no    INSOMNIA Takes  Amitriptyline  40 MG at bedtime.  Has tried Trazodone, Ambien , Melatonin in past. Never had a sleep study, does not snore at night. Duration: chronic Satisfied with sleep quality: no Difficulty falling asleep: no Difficulty staying asleep: no Waking a few hours after sleep onset: yes Early morning awakenings: no Daytime hypersomnolence: no Wakes feeling refreshed: yes Good sleep hygiene: yes Apnea: no Snoring: no Depressed/anxious mood: no Recent stress: no Restless legs/nocturnal leg cramps: no Chronic pain/arthritis: no History of sleep study: yes in past, was negative Treatments attempted: ambien , Trazodone, Melatonin     10/22/2023    8:24 AM 10/14/2023    2:30 PM 07/23/2023    2:10 PM 04/02/2023   10:00 AM 12/21/2022    3:38 PM  Depression screen PHQ 2/9  Decreased Interest 0 0 0 2 0  Down, Depressed, Hopeless 0 0 0 0 0   PHQ - 2 Score 0 0 0 2 0  Altered sleeping 0  0 0 0  Tired, decreased energy 0  1 2 0  Change in appetite 0  0 1 0  Feeling bad or failure about yourself  0  0 0 0  Trouble concentrating 0  0 0 0  Moving slowly or fidgety/restless 0  0 0 0  Suicidal thoughts 0  0 0 0  PHQ-9 Score 0  1 5 0  Difficult doing work/chores Not difficult at all  Not difficult at all Not difficult at all Not difficult at all       10/22/2023    8:56 AM 07/23/2023    2:11 PM 04/02/2023   10:00 AM 12/21/2022    3:38 PM  GAD 7 : Generalized Anxiety Score  Nervous, Anxious, on Edge 0 1 0 0  Control/stop worrying 0 0 0 0  Worry too much - different things 0 0 0 0  Trouble relaxing 0 0 0 0  Restless 0 0 0 0  Easily annoyed or irritable 0 0 0 0  Afraid - awful might happen 0 0 0 0  Total GAD 7 Score 0 1 0 0  Anxiety Difficulty Not difficult at all Not difficult at all Not difficult at all Not difficult at all   Relevant past medical, surgical, family and social history reviewed and updated as indicated. Interim medical history since our last visit reviewed. Allergies and medications reviewed and updated.  Review of Systems  Constitutional:  Negative for activity change, diaphoresis, fatigue and fever.  Respiratory:  Positive for shortness of breath. Negative for cough, chest tightness and wheezing.   Cardiovascular:  Positive for chest pain (x 1 episode). Negative for palpitations and leg swelling.  Gastrointestinal: Negative.   Endocrine: Negative for polydipsia, polyphagia and polyuria.  Neurological: Negative.   Psychiatric/Behavioral: Negative.      Per HPI unless specifically indicated above     Objective:    BP 135/64 (BP Location: Left Arm, Cuff Size: Normal)   Pulse (!) 55   Temp 97.8 F (36.6 C) (Oral)   Ht 6' (1.829 m)   Wt 182 lb 3.2 oz (82.6 kg)   SpO2 98%   BMI 24.71 kg/m   Wt Readings from Last 3 Encounters:  10/22/23 182 lb 3.2 oz (82.6 kg)  10/14/23 185 lb (83.9 kg)  07/23/23 180  lb 12.8 oz (82 kg)    Physical Exam Vitals and nursing note reviewed.  Constitutional:      General: He is awake. He is not in acute distress.    Appearance: He is well-developed and well-groomed. He is not ill-appearing or toxic-appearing.  HENT:     Head: Normocephalic.     Right Ear: Hearing and external ear normal.     Left Ear: Hearing and external ear normal.  Eyes:     General: Lids are normal.     Extraocular Movements: Extraocular movements intact.     Conjunctiva/sclera: Conjunctivae normal.  Neck:     Thyroid : No thyromegaly.     Vascular: No carotid bruit.  Cardiovascular:     Rate and Rhythm: Normal rate and regular rhythm.     Heart sounds: Normal heart sounds. No murmur heard.    No gallop.  Pulmonary:     Effort: No accessory muscle usage or respiratory distress.     Breath sounds: Normal breath sounds.  Abdominal:     General: Bowel sounds are normal. There is no distension.     Palpations: Abdomen is soft.     Tenderness: There is no abdominal tenderness.  Musculoskeletal:     Cervical back: Full passive range of motion without pain.     Right lower leg: No edema.     Left lower leg: No edema.  Lymphadenopathy:     Cervical: No cervical adenopathy.  Skin:    General: Skin is warm.     Capillary Refill: Capillary refill takes less than 2 seconds.  Neurological:     Mental Status: He is alert and oriented to person, place, and time.     Deep Tendon Reflexes: Reflexes are normal and symmetric.     Reflex Scores:      Brachioradialis reflexes are 2+ on the right side and 2+ on the left side.      Patellar reflexes are 2+ on the right side and 2+ on the left side. Psychiatric:        Attention and Perception: Attention normal.        Mood and Affect: Mood normal.        Speech: Speech normal.        Behavior: Behavior normal. Behavior is cooperative.        Thought Content: Thought content normal.     Results for orders placed or performed in visit on  08/23/23  Basic metabolic panel   Collection Time: 08/23/23  8:21 AM  Result Value Ref Range   Glucose 86 70 - 99 mg/dL   BUN 22 8 - 27 mg/dL   Creatinine, Ser 8.26 (H) 0.76 - 1.27 mg/dL   eGFR 41 (L) >40 fO/fpw/8.26   BUN/Creatinine Ratio 13 10 - 24   Sodium 144 134 - 144 mmol/L   Potassium 4.4 3.5 - 5.2 mmol/L   Chloride 108 (H) 96 - 106 mmol/L   CO2 23 20 - 29 mmol/L   Calcium  9.1 8.6 - 10.2 mg/dL      Assessment & Plan:   Problem List Items Addressed This Visit       Cardiovascular and Mediastinum   CAD (coronary artery disease)   Chronic, stable at present.  Continue current medication regimen and collaboration with cardiology.      Relevant Medications   ramipril  (ALTACE ) 2.5 MG capsule   atenolol  (TENORMIN ) 50 MG tablet   atorvastatin  (LIPITOR) 80 MG tablet   fenofibrate  (TRICOR ) 145 MG  tablet   omega-3 acid ethyl esters (LOVAZA ) 1 g capsule   Hypertension associated with type 2 diabetes mellitus (HCC)   Chronic, stable.  BP at goal on check today for age.  Will continue current medication regimen and cardiology collaboration.  Recommend he monitor BP at home at least a few mornings a week and document for provider + focus on DASH diet.  Could consider addition of Amlodipine  if elevation in SBP noted.  Urine ALB 80 February 2025, continue Ramipril  for kidney protection.  LABS: CMP, TSH, and urine ALB.      Relevant Medications   ramipril  (ALTACE ) 2.5 MG capsule   atenolol  (TENORMIN ) 50 MG tablet   atorvastatin  (LIPITOR) 80 MG tablet   fenofibrate  (TRICOR ) 145 MG tablet   omega-3 acid ethyl esters (LOVAZA ) 1 g capsule   Other Relevant Orders   Bayer DCA Hb A1c Waived   Microalbumin, Urine Waived   Comprehensive metabolic panel   TSH     Endocrine   CKD stage 3 due to type 2 diabetes mellitus (HCC)   Chronic, ongoing CKD 3a to 3 b on checks.  Followed by endocrinology with A1c 9% today (trend down) and urine ALB 80 February 2025 -- continue Ramipril  for kidney  protection.  Continue current medication regimen as prescribed by endocrinology.  PharmD collaboration present. Recommend he check BS at least twice a day, fasting and 2 hours after a meal.   - Foot and eye exams up to date - Vaccinations up to date - ACE and Statin on board.      Relevant Medications   ramipril  (ALTACE ) 2.5 MG capsule   atorvastatin  (LIPITOR) 80 MG tablet   Other Relevant Orders   Bayer DCA Hb A1c Waived   Microalbumin, Urine Waived   Comprehensive metabolic panel   Hyperlipidemia associated with type 2 diabetes mellitus (HCC)   Chronic, ongoing.  Continue Atorvastatin , Lovaza , and Fenofibrate , tolerating at this time.  Adjust dose as needed.  Lipid panel today.  Insurance would not cover Repatha .      Relevant Medications   ramipril  (ALTACE ) 2.5 MG capsule   atenolol  (TENORMIN ) 50 MG tablet   atorvastatin  (LIPITOR) 80 MG tablet   fenofibrate  (TRICOR ) 145 MG tablet   omega-3 acid ethyl esters (LOVAZA ) 1 g capsule   Other Relevant Orders   Bayer DCA Hb A1c Waived   Comprehensive metabolic panel   Lipid Panel w/o Chol/HDL Ratio   Type 2 diabetes mellitus with hyperglycemia, with long-term current use of insulin  (HCC)   Chronic, ongoing CKD 3a to 3 b on checks.  Followed by endocrinology with A1c 9% today (trend down) and urine ALB 80 February 2025 -- continue Ramipril  for kidney protection.  Continue current medication regimen as prescribed by endocrinology.  PharmD collaboration present. Recommend he check BS at least twice a day, fasting and 2 hours after a meal.   - Foot and eye exams up to date - Vaccinations up to date - ACE and Statin on board.      Relevant Medications   ramipril  (ALTACE ) 2.5 MG capsule   atorvastatin  (LIPITOR) 80 MG tablet   Other Relevant Orders   Bayer DCA Hb A1c Waived   Microalbumin, Urine Waived   Type 2 diabetes mellitus with proteinuria (HCC) - Primary   Chronic, ongoing CKD 3a to 3 b on checks.  Followed by endocrinology with  A1c 9% today (trend down) and urine ALB 80 February 2025 -- continue Ramipril  for kidney protection.  Continue  current medication regimen as prescribed by endocrinology.  PharmD collaboration present. Recommend he check BS at least twice a day, fasting and 2 hours after a meal.   - Foot and eye exams up to date - Vaccinations up to date - ACE and Statin on board.      Relevant Medications   ramipril  (ALTACE ) 2.5 MG capsule   atorvastatin  (LIPITOR) 80 MG tablet   Other Relevant Orders   Bayer DCA Hb A1c Waived   Microalbumin, Urine Waived     Other   History of non-ST elevation myocardial infarction (NSTEMI)   Acute and improved at this time. Continue current medication regimen as ordered by cardiology.  To stay on ASA indefinitely and Prasugel continues.      Hypertriglyceridemia   Ongoing issue with statin, Fenofibrate , and Fish Oil on board, will recheck Lipid panel today and continue collaboration with cardiology.  Attempted to get Repatha  in past but insurance denied this.       Relevant Medications   ramipril  (ALTACE ) 2.5 MG capsule   atenolol  (TENORMIN ) 50 MG tablet   atorvastatin  (LIPITOR) 80 MG tablet   fenofibrate  (TRICOR ) 145 MG tablet   omega-3 acid ethyl esters (LOVAZA ) 1 g capsule   Insomnia   Chronic, ongoing.   Belsomra  too costly.  Would avoid Ambien  and benzo use due to age and BEERS.  Continue Amtriptyline 40 MG at night which has offered benefit to him. Continue to monitor and adjust regimen as needed.  Recommend sleep hygiene techniques.  Could consider therapy for CBT in future.          Follow up plan: Return in about 4 months (around 02/22/2024) for T2DM, HTN/HLD.

## 2023-10-22 NOTE — Assessment & Plan Note (Signed)
 Ongoing issue with statin, Fenofibrate, and Fish Oil on board, will recheck Lipid panel today and continue collaboration with cardiology.  Attempted to get Repatha in past but insurance denied this.

## 2023-10-22 NOTE — Assessment & Plan Note (Signed)
Chronic, ongoing.   Belsomra too costly.  Would avoid Ambien and benzo use due to age and BEERS.  Continue Amtriptyline 40 MG at night which has offered benefit to him. Continue to monitor and adjust regimen as needed.  Recommend sleep hygiene techniques.  Could consider therapy for CBT in future.

## 2023-10-23 LAB — COMPREHENSIVE METABOLIC PANEL
ALT: 16 [IU]/L (ref 0–44)
AST: 14 [IU]/L (ref 0–40)
Albumin: 4.3 g/dL (ref 3.8–4.8)
Alkaline Phosphatase: 56 [IU]/L (ref 44–121)
BUN/Creatinine Ratio: 11 (ref 10–24)
BUN: 16 mg/dL (ref 8–27)
Bilirubin Total: 0.3 mg/dL (ref 0.0–1.2)
CO2: 20 mmol/L (ref 20–29)
Calcium: 9.2 mg/dL (ref 8.6–10.2)
Chloride: 106 mmol/L (ref 96–106)
Creatinine, Ser: 1.47 mg/dL — ABNORMAL HIGH (ref 0.76–1.27)
Globulin, Total: 2.1 g/dL (ref 1.5–4.5)
Glucose: 98 mg/dL (ref 70–99)
Potassium: 4.5 mmol/L (ref 3.5–5.2)
Sodium: 141 mmol/L (ref 134–144)
Total Protein: 6.4 g/dL (ref 6.0–8.5)
eGFR: 50 mL/min/{1.73_m2} — ABNORMAL LOW (ref >59)

## 2023-10-23 LAB — LIPID PANEL W/O CHOL/HDL RATIO
Cholesterol, Total: 153 mg/dL (ref 100–199)
HDL: 31 mg/dL — ABNORMAL LOW (ref >39)
LDL Chol Calc (NIH): 82 mg/dL (ref 0–99)
Triglycerides: 240 mg/dL — ABNORMAL HIGH (ref 0–149)
VLDL Cholesterol Cal: 40 mg/dL (ref 5–40)

## 2023-10-23 LAB — TSH: TSH: 1.04 u[IU]/mL (ref 0.450–4.500)

## 2023-10-24 ENCOUNTER — Encounter: Payer: Self-pay | Admitting: Nurse Practitioner

## 2023-10-24 NOTE — Progress Notes (Signed)
 Contacted via MyChart   Good afternoon [name], your labs have returned: - Kidney function continues to show Stage 3a kidney disease with no worsening, creatinine and eGFR.  Liver function, AST and ALT, is normal. - Lipid panel remains at baseline for you with mild elevation in triglycerides . Continue Zetia  and Atorvastatin .  Thyroid , TSH, is normal.  Any questions? Keep being amazin!!  Thank you for allowing me to participate in your care.  I appreciate you. Kindest regards, Melaine Mcphee

## 2023-10-25 ENCOUNTER — Telehealth: Payer: Self-pay

## 2023-10-25 NOTE — Progress Notes (Signed)
   10/25/2023  Patient ID: [name], male   DOB: 1950/10/17, 73 y.o.   MRN: [id number]  Out reach attempt to follow-up on Farxiga patient assistance program application mailed to patient recently and also see if I can assist with an issue patient reported to PCP in regard to freestyle libre.  I was not able to reach the patient, but I did leave a HIPAA compliant voicemail with my direct phone number.  I am also sending him a MyChart message.  Linn Rich, PharmD, DPLA

## 2023-10-25 NOTE — Progress Notes (Signed)
   10/25/2023  Patient ID: [name], male   DOB: 01-Mar-1951, 73 y.o.   MRN: [id number]  Patient returned my call from earlier, and he states that freestyle libre 3 sensors used to be $35 to fill, but these are now over $300.  He has gone back to doing multiple fingersticks daily to check blood sugar.  Submitting a prescription under the [facility] standing order for CGM for libre 3+ via [employer] to see if patient's Medicare part B will pay for CGM.  Patient also endorses returning his patient portion of the [place] PAP application for Farxiga to the doctor's office.  I do not see mention of this, but I will see if it is in my folder while at [facility] Wednesday.  Of note, endocrinologist has faxed their portion into the medication assistance team, and this has been scanned under the media tab.  Linn Rich, PharmD, DPLA

## 2023-10-26 ENCOUNTER — Other Ambulatory Visit: Payer: Self-pay | Admitting: Nurse Practitioner

## 2023-10-26 MED ORDER — AMLODIPINE BESYLATE 5 MG PO TABS: 5.0000 mg | ORAL_TABLET | Freq: Every day | ORAL | 3 refills | Status: DC

## 2023-10-26 NOTE — Telephone Encounter (Signed)
Requested medication (s) are due for refill today: Yes  Requested medication (s) are on the active medication list: Yes  Last refill:  08/12/23  Future visit scheduled: Yes  Notes to clinic:  Unable to refill per protocol, last refill by another provider. Patient requesting refill asap today     Requested Prescriptions  Pending Prescriptions Disp Refills   amLODipine (NORVASC) 5 MG tablet      Sig: Take 1 tablet (5 mg total) by mouth daily.     Cardiovascular: Calcium Channel Blockers 2 Passed - 10/26/2023 11:37 AM      Passed - Last BP in normal range    BP Readings from Last 1 Encounters:  10/22/23 135/64         Passed - Last Heart Rate in normal range    Pulse Readings from Last 1 Encounters:  10/22/23 (!) 55         Passed - Valid encounter within last 6 months    Recent Outpatient Visits           3 months ago Type 2 diabetes mellitus with proteinuria (HCC)   [place] [facility] [place], [place] T, NP   6 months ago Type 2 diabetes mellitus with proteinuria (HCC)   [place] [facility] [place], Corrie Dandy T, NP   8 months ago Left nephrolithiasis   [place] [facility] Mec[place], Oswaldo Conroy, PA-C   10 months ago Community acquired pneumonia of right lower lobe of lung   [place] [facility] [place], [place] T, NP   11 months ago AKI (acute kidney injury) (HCC)   [place] [facility] [place], Dorie Rank, NP       Future Appointments             In 4 months Cannady, Dorie Rank, NP  [facility], [name]

## 2023-10-26 NOTE — Telephone Encounter (Signed)
Medication Refill -  Most Recent Primary Care Visit:  Provider: Tora Kindred  Department: [id number]  Visit Type: MEDICARE AWV, SEQUENTIAL  Date: 10/14/2023  Medication: amLODipine (NORVASC) 5 MG tablet. Patient states he has refills at [facility] [phone] and would like his prescription transferred to [facility] as soon as possible.   Is this the correct pharmacy for this prescription? Yes   [id number] - [place],  - [address] AT [facility] [address] [place] [address] Phone: [phone] Fax: [phone]   Has the prescription been filled recently? Yes  Is the patient out of the medication? Yes   Has the patient been seen for an appointment in the last year OR does the patient have an upcoming appointment? Yes  Can we respond through MyChart? No   Agent: Please be advised that Rx refills may take up to 3 business days. We ask that you follow-up with your pharmacy.

## 2023-10-27 NOTE — Telephone Encounter (Signed)
PAP: Application for Marcelline Deist has been submitted to [employer] (AZ[employer]Me), via fax   PLEASE BE ADVISE PT APP AND PROVIDER PAGES ARE SCANNED INTO MEDIA OF CHART

## 2023-10-28 DIAGNOSIS — I7090 Unspecified atherosclerosis: Secondary | ICD-10-CM | POA: Diagnosis not present

## 2023-10-28 DIAGNOSIS — E1165 Type 2 diabetes mellitus with hyperglycemia: Secondary | ICD-10-CM | POA: Diagnosis not present

## 2023-10-28 DIAGNOSIS — E782 Mixed hyperlipidemia: Secondary | ICD-10-CM | POA: Diagnosis not present

## 2023-10-28 DIAGNOSIS — Z955 Presence of coronary angioplasty implant and graft: Secondary | ICD-10-CM | POA: Diagnosis not present

## 2023-10-28 DIAGNOSIS — I252 Old myocardial infarction: Secondary | ICD-10-CM | POA: Diagnosis not present

## 2023-10-28 DIAGNOSIS — Z7902 Long term (current) use of antithrombotics/antiplatelets: Secondary | ICD-10-CM | POA: Diagnosis not present

## 2023-10-28 DIAGNOSIS — Z951 Presence of aortocoronary bypass graft: Secondary | ICD-10-CM | POA: Diagnosis not present

## 2023-10-28 DIAGNOSIS — Z7982 Long term (current) use of aspirin: Secondary | ICD-10-CM | POA: Diagnosis not present

## 2023-10-28 DIAGNOSIS — E1122 Type 2 diabetes mellitus with diabetic chronic kidney disease: Secondary | ICD-10-CM | POA: Diagnosis not present

## 2023-10-28 DIAGNOSIS — I129 Hypertensive chronic kidney disease with stage 1 through stage 4 chronic kidney disease, or unspecified chronic kidney disease: Secondary | ICD-10-CM | POA: Diagnosis not present

## 2023-11-03 ENCOUNTER — Telehealth: Payer: Self-pay

## 2023-11-03 NOTE — Progress Notes (Signed)
   11/03/2023  Patient ID: [name], male   DOB: 1951-01-28, 73 y.o.   MRN: [id number]  Contacted [place] and me patient assistance program to follow-up on patient's application for Farxiga 10 mg daily.  The patient has been approved through 09/13/2024, and the program states he should receive this medication in the next 7 to 10 days.  Sending patient a MyChart message to make him aware.  Lenna Gilford, PharmD, DPLA

## 2023-11-04 NOTE — Telephone Encounter (Signed)
 PAP: Patient assistance application for Marcelline Deist has been approved by PAP Companies: [employer] from 10/27/2023 to 09/13/2024. Medication should be delivered to PAP Delivery: Home. For further shipping updates, please contact [employer] (AZ[employer]Me) at [phone]. Patient ID is: NO ID   PLEASE BE ADVISED PT SHIPMENT HAS BEEN PROCESS AND SHIPPED OUT 10/28/2023 WILL TAKE 10-14 BUSINESS DAYS PT HAS BEEN NOTIFIED BY [facility]

## 2023-12-23 ENCOUNTER — Other Ambulatory Visit: Payer: Self-pay | Admitting: Nurse Practitioner

## 2023-12-23 NOTE — Telephone Encounter (Signed)
 Requested Prescriptions  Pending Prescriptions Disp Refills   amitriptyline (ELAVIL) 10 MG tablet [Pharmacy Med Name: Amitriptyline HCl Oral Tablet 10 MG] 360 tablet 0    Sig: TAKE 4 TABLETS AT BEDTIME     Psychiatry:  Antidepressants - Heterocyclics (TCAs) Passed - 12/23/2023  2:52 PM      Passed - Valid encounter within last 6 months    Recent Outpatient Visits           2 months ago Type 2 diabetes mellitus with proteinuria (HCC)   [place] [facility] [place], Corrie Dandy T, NP       Future Appointments             In 2 months Cannady, Dorie Rank, NP [place] [name]             atenolol (TENORMIN) 50 MG tablet [Pharmacy Med Name: Atenolol Oral Tablet 50 MG] 90 tablet 0    Sig: TAKE 1 TABLET EVERY DAY     Cardiovascular: Beta Blockers 2 Failed - 12/23/2023  2:52 PM      Failed - Cr in normal range and within 360 days    Creatinine  Date Value Ref Range Status  09/20/2014 1.10 0.60 - 1.30 mg/dL Final   Creatinine, Ser  Date Value Ref Range Status  10/22/2023 1.47 (H) 0.76 - 1.27 mg/dL Final         Passed - Last BP in normal range    BP Readings from Last 1 Encounters:  10/22/23 135/64         Passed - Last Heart Rate in normal range    Pulse Readings from Last 1 Encounters:  10/22/23 (!) 55         Passed - Valid encounter within last 6 months    Recent Outpatient Visits           2 months ago Type 2 diabetes mellitus with proteinuria (HCC)   [place] [facility] [place], Dorie Rank, NP       Future Appointments             In 2 months Cannady, Dorie Rank, NP [place] [name]

## 2024-02-20 NOTE — Patient Instructions (Signed)
Be Involved in Caring For Your Health:  Taking Medications When medications are taken as directed, they can greatly improve your health. But if they are not taken as prescribed, they may not work. In some cases, not taking them correctly can be harmful. To help ensure your treatment remains effective and safe, understand your medications and how to take them. Bring your medications to each visit for review by your provider.  Your lab results, notes, and after visit summary will be available on My Chart. We strongly encourage you to use this feature. If lab results are abnormal the clinic will contact you with the appropriate steps. If the clinic does not contact you assume the results are satisfactory. You can always view your results on My Chart. If you have questions regarding your health or results, please contact the clinic during office hours. You can also ask questions on My Chart.  We at [facility] are grateful that you chose [place] to provide your care. We strive to provide evidence-based and compassionate care and are always looking for feedback. If you get a survey from the clinic please complete this so we can hear your opinions.  Diabetes Mellitus and Exercise Regular exercise is important for your health, especially if you have diabetes mellitus. Exercise is not just about losing weight. It can also help you increase muscle strength and bone density and reduce body fat and stress. This can help your level of endurance and make you more fit and flexible. Why should I exercise if I have diabetes? Exercise has many benefits for people with diabetes. It can: Help lower and control your blood sugar (glucose). Help your body respond better and become more sensitive to the hormone insulin. Reduce how much insulin your body needs. Lower your risk for heart disease by: Lowering how much "bad" cholesterol and triglycerides you have in your body. Increasing how much "good" cholesterol  you have in your body. Lowering your blood pressure. Lowering your blood glucose levels. What is my activity plan? Your health care provider or an expert trained in diabetes care (certified diabetes educator) can help you make an activity plan. This plan can help you find the type of exercise that works for you. It may also tell you how often to exercise and for how long. Be sure to: Get at least 150 minutes of medium-intensity or high-intensity exercise each week. This may involve brisk walking, biking, or water aerobics. Do stretching and strengthening exercises at least 2 times a week. This may involve yoga or weight lifting. Spread out your activity over at least 3 days of the week. Get some form of physical activity each day. Do not go more than 2 days in a row without some kind of activity. Avoid being inactive for more than 30 minutes at a time. Take frequent breaks to walk or stretch. Choose activities that you enjoy. Set goals that you know you can accomplish. Start slowly and increase the intensity of your exercise over time. How do I manage my diabetes during exercise?  Monitor your blood glucose Check your blood glucose before and after you exercise. If your blood glucose is 240 mg/dL (40.9 mmol/L) or higher before you exercise, check your urine for ketones. These are chemicals created by the liver. If you have ketones in your urine, do not exercise until your blood glucose returns to normal. If your blood glucose is 100 mg/dL (5.6 mmol/L) or lower, eat a snack that has 15-20 grams of carbohydrate in  it. Check your blood glucose 15 minutes after the snack to make sure that your level is above 100 mg/dL (5.6 mmol/L) before you start to exercise. Your risk for low blood glucose (hypoglycemia) goes up during and after exercise. Know the symptoms of this condition and how to treat it. Follow these instructions at home: Keep a carbohydrate snack on hand for use before, during, and after  exercise. This can help prevent or treat hypoglycemia. Avoid injecting insulin into parts of your body that are going to be used during exercise. This may include: Your arms, when you are going to play tennis. Your legs, when you are about to go jogging. Keep track of your exercise habits. This can help you and your health care provider watch and adjust your activity plan. Write down: What you eat before and after you exercise. Blood glucose levels before and after you exercise. The type and amount of exercise you do. Talk to your health care provider before you start a new activity. They may need to: Make sure that the activity is safe for you. Adjust your insulin, other medicines, and food that you eat. Drink water while you exercise. This can stop you from losing too much water (dehydration). It can also prevent problems caused by having a lot of heat in your body (heat stroke). Where to find more information American Diabetes Association: diabetes.org Association of Diabetes Care & Education Specialists: diabeteseducator.org This information is not intended to replace advice given to you by your health care provider. Make sure you discuss any questions you have with your health care provider. Document Revised: 02/18/2022 Document Reviewed: 02/18/2022 Elsevier Patient Education  2024 [employer].

## 2024-02-25 ENCOUNTER — Encounter: Payer: Self-pay | Admitting: Nurse Practitioner

## 2024-02-25 ENCOUNTER — Ambulatory Visit: Payer: Self-pay | Admitting: Nurse Practitioner

## 2024-02-25 ENCOUNTER — Ambulatory Visit (INDEPENDENT_AMBULATORY_CARE_PROVIDER_SITE_OTHER): Payer: Medicare PPO | Admitting: Nurse Practitioner

## 2024-02-25 VITALS — BP 127/64 | HR 61 | Temp 98.3°F | Ht 72.0 in | Wt 178.6 lb

## 2024-02-25 DIAGNOSIS — E781 Pure hyperglyceridemia: Secondary | ICD-10-CM

## 2024-02-25 DIAGNOSIS — E1129 Type 2 diabetes mellitus with other diabetic kidney complication: Secondary | ICD-10-CM

## 2024-02-25 DIAGNOSIS — E1165 Type 2 diabetes mellitus with hyperglycemia: Secondary | ICD-10-CM

## 2024-02-25 DIAGNOSIS — E1122 Type 2 diabetes mellitus with diabetic chronic kidney disease: Secondary | ICD-10-CM

## 2024-02-25 DIAGNOSIS — E1169 Type 2 diabetes mellitus with other specified complication: Secondary | ICD-10-CM

## 2024-02-25 DIAGNOSIS — E1159 Type 2 diabetes mellitus with other circulatory complications: Secondary | ICD-10-CM | POA: Diagnosis not present

## 2024-02-25 DIAGNOSIS — I2510 Atherosclerotic heart disease of native coronary artery without angina pectoris: Secondary | ICD-10-CM

## 2024-02-25 DIAGNOSIS — Z794 Long term (current) use of insulin: Secondary | ICD-10-CM | POA: Diagnosis not present

## 2024-02-25 DIAGNOSIS — R809 Proteinuria, unspecified: Secondary | ICD-10-CM

## 2024-02-25 DIAGNOSIS — I252 Old myocardial infarction: Secondary | ICD-10-CM

## 2024-02-25 DIAGNOSIS — E785 Hyperlipidemia, unspecified: Secondary | ICD-10-CM

## 2024-02-25 DIAGNOSIS — I152 Hypertension secondary to endocrine disorders: Secondary | ICD-10-CM

## 2024-02-25 LAB — BAYER DCA HB A1C WAIVED: HB A1C (BAYER DCA - WAIVED): 9.4 % — ABNORMAL HIGH (ref 4.8–5.6)

## 2024-02-25 MED ORDER — ATENOLOL 50 MG PO TABS: 50.0000 mg | ORAL_TABLET | Freq: Every day | ORAL | 0 refills | Status: DC

## 2024-02-25 MED ORDER — RAMIPRIL 2.5 MG PO CAPS: 2.5000 mg | ORAL_CAPSULE | Freq: Every day | ORAL | 2 refills | Status: DC

## 2024-02-25 MED ORDER — OMEGA-3-ACID ETHYL ESTERS 1 G PO CAPS: 2.0000 | ORAL_CAPSULE | Freq: Two times a day (BID) | ORAL | 4 refills | Status: DC

## 2024-02-25 MED ORDER — AMITRIPTYLINE HCL 10 MG PO TABS: 40.0000 mg | ORAL_TABLET | Freq: Every day | ORAL | 2 refills | Status: DC

## 2024-02-25 NOTE — Assessment & Plan Note (Signed)
 Having some increase SOB he plans on discussing further with cardiology upcoming, they are aware. Continue current medication regimen as ordered by cardiology.  To stay on ASA indefinitely and Prasugel continues. Strict ER precautions given.

## 2024-02-25 NOTE — Assessment & Plan Note (Signed)
 Chronic, ongoing.  Followed by endocrinology with A1c 9% last check, will recheck today, and urine ALB 80 February 2025 -- continue Ramipril  and Farxiga for kidney protection.  Continue current medication regimen as prescribed by endocrinology.  PharmD collaboration present. Recommend he check BS at least twice a day, fasting and 2 hours after a meal.   - Foot and eye exams up to date - Vaccinations up to date - ACE and Statin on board.

## 2024-02-25 NOTE — Assessment & Plan Note (Addendum)
 Chronic, stable.  BP at goal on check today for age.  Will continue current medication regimen and cardiology collaboration.  Recommend he monitor BP at home at least a few mornings a week and document for provider + focus on DASH diet.  Urine ALB 80 February 2025, continue Ramipril  for kidney protection.  LABS: CMP.

## 2024-02-25 NOTE — Assessment & Plan Note (Signed)
 Chronic, stable at present.  Continue current medication regimen and collaboration with cardiology.

## 2024-02-25 NOTE — Assessment & Plan Note (Signed)
 Chronic, ongoing.  Continue Atorvastatin, Lovaza, and Fenofibrate, tolerating at this time.  Adjust dose as needed.  Lipid panel today.  Insurance would not cover Repatha.

## 2024-02-25 NOTE — Assessment & Plan Note (Signed)
 Ongoing issue with statin, Fenofibrate , and Fish Oil on board, will recheck lipid panel today and continue collaboration with cardiology.  Attempted to get Repatha  in past but insurance denied this.

## 2024-02-25 NOTE — Progress Notes (Signed)
 BP 127/64 (BP Location: Left Arm, Cuff Size: Normal)   Pulse 61   Temp 98.3 F (36.8 C) (Oral)   Ht 6' (1.829 m)   Wt 178 lb 9.6 oz (81 kg)   SpO2 98%   BMI 24.22 kg/m    Subjective:    Patient ID: [name], male    DOB: 07/26/1951, 73 y.o.   MRN: [id number]  HPI: [name] is a 73 y.o. male  Chief Complaint  Patient presents with   Diabetes   Hyperlipidemia   Hypertension   DIABETES Was diagnosed >10 years ago.  Dr. Shelvy Dickens with last visit 10/15/23, increased Glipizide  to 20 MG daily XL dosing.  A1c 9% in February.  Taking Tresiba, Farxiga (gets vis assistance), Metformin , Glipizide .  Has lost 7 pounds since January. Hypoglycemic episodes:no Polydipsia/polyuria: no Visual disturbance: no Chest pain: no Paresthesias: no Glucose Monitoring: yes             Accucheck frequency: Daily             Fasting glucose: averaging 130 range             Post prandial:             Evening:             Before meals: Taking Insulin ?: yes             Long acting insulin : Tresiba 60 units             Short acting insulin : Blood Pressure Monitoring: not checking Retinal Examination: Up To Date -- [facility], goes on Monday Foot Exam: Up to Date Pneumovax: Up to Date Influenza: Up to Date Aspirin : yes    CHRONIC KIDNEY DISEASE (CKD 3b) CKD status: stable   Medications renally dose: yes Previous renal evaluation: no Pneumovax:  Up to Date Influenza Vaccine:  Up to Date     Latest Ref Rng & Units 10/22/2023    8:26 AM 08/23/2023    8:21 AM 07/23/2023    2:11 PM  BMP  Glucose 70 - 99 mg/dL 98  86  782   BUN 8 - 27 mg/dL 16  22  27    Creatinine 0.76 - 1.27 mg/dL 9.56  2.13  0.86   BUN/Creat Ratio 10 - 24 11  13  15    Sodium 134 - 144 mmol/L 141  144  137   Potassium 3.5 - 5.2 mmol/L 4.5  4.4  5.0   Chloride 96 - 106 mmol/L 106  108  101   CO2 20 - 29 mmol/L 20  23  20    Calcium  8.6 - 10.2 mg/dL 9.2  9.1  9.4     HYPERTENSION / HYPERLIPIDEMIA Continues  Ramipril  1.25 MG, Atenolol  50 MG, ASA, Fenofibrate , Rosuvastatin , Lovaza , and Plavix . To continue dual anti-platelet therapy.  Last echo was 07/19/23 with EF 55-60%. History of CABG in 1998, stent in 2006, catheterization in 2011. Last saw cardiology 10/28/23 and returns on June 19th.  Having more SOB which cardiology is aware of.  No NTG use recently.  Quit smoking in 1998, smoked about 2 PPD.   Satisfied with current treatment? yes Duration of hypertension: chronic BP monitoring frequency: occasional BP range:  BP medication side effects: no Duration of hyperlipidemia: chronic Cholesterol medication side effects: no Cholesterol supplements: none Medication compliance: good compliance Aspirin : yes Recent stressors: no Recurrent headaches: no Visual changes: no Palpitations: no Dyspnea: this has been worsening and cardiology aware,  sees on the 19th Chest pain:no Lower extremity edema: no Dizzy/lightheaded: no    INSOMNIA Has self increased in Amitriptyline  to 50 MG at bedtime, this helps a little more. Has tried Trazodone, Ambien , Melatonin in past. Never had a sleep study, does not snore at night. Duration: chronic Satisfied with sleep quality: no Difficulty falling asleep: yes Difficulty staying asleep: yes Waking a few hours after sleep onset: yes Early morning awakenings: no Daytime hypersomnolence: no Wakes feeling refreshed: yes Good sleep hygiene: yes Apnea: no Snoring: no Depressed/anxious mood: no Recent stress: yes Restless legs/nocturnal leg cramps: no Chronic pain/arthritis: no History of sleep study: yes in past, was negative Treatments attempted: Ambien , Trazodone, Melatonin     10/22/2023    8:24 AM 10/14/2023    2:30 PM 07/23/2023    2:10 PM 04/02/2023   10:00 AM 12/21/2022    3:38 PM  Depression screen PHQ 2/9  Decreased Interest 0 0 0 2 0  Down, Depressed, Hopeless 0 0 0 0 0  PHQ - 2 Score 0 0 0 2 0  Altered sleeping 0  0 0 0  Tired, decreased energy 0  1 2  0  Change in appetite 0  0 1 0  Feeling bad or failure about yourself  0  0 0 0  Trouble concentrating 0  0 0 0  Moving slowly or fidgety/restless 0  0 0 0  Suicidal thoughts 0  0 0 0  PHQ-9 Score 0  1 5 0  Difficult doing work/chores Not difficult at all  Not difficult at all Not difficult at all Not difficult at all       10/22/2023    8:56 AM 07/23/2023    2:11 PM 04/02/2023   10:00 AM 12/21/2022    3:38 PM  GAD 7 : Generalized Anxiety Score  Nervous, Anxious, on Edge 0 1 0 0  Control/stop worrying 0 0 0 0  Worry too much - different things 0 0 0 0  Trouble relaxing 0 0 0 0  Restless 0 0 0 0  Easily annoyed or irritable 0 0 0 0  Afraid - awful might happen 0 0 0 0  Total GAD 7 Score 0 1 0 0  Anxiety Difficulty Not difficult at all Not difficult at all Not difficult at all Not difficult at all   Relevant past medical, surgical, family and social history reviewed and updated as indicated. Interim medical history since our last visit reviewed. Allergies and medications reviewed and updated.  Review of Systems  Constitutional:  Negative for activity change, diaphoresis, fatigue and fever.  Respiratory:  Positive for shortness of breath. Negative for cough, chest tightness and wheezing.   Cardiovascular:  Positive for chest pain (x 1 episode). Negative for palpitations and leg swelling.  Gastrointestinal: Negative.   Endocrine: Negative for polydipsia, polyphagia and polyuria.  Neurological: Negative.   Psychiatric/Behavioral: Negative.      Per HPI unless specifically indicated above     Objective:    BP 127/64 (BP Location: Left Arm, Cuff Size: Normal)   Pulse 61   Temp 98.3 F (36.8 C) (Oral)   Ht 6' (1.829 m)   Wt 178 lb 9.6 oz (81 kg)   SpO2 98%   BMI 24.22 kg/m   Wt Readings from Last 3 Encounters:  02/25/24 178 lb 9.6 oz (81 kg)  10/22/23 182 lb 3.2 oz (82.6 kg)  10/14/23 185 lb (83.9 kg)    Physical Exam Vitals and nursing note reviewed.  Constitutional:       General: He is awake. He is not in acute distress.    Appearance: He is well-developed and well-groomed. He is not ill-appearing or toxic-appearing.  HENT:     Head: Normocephalic.     Right Ear: Hearing and external ear normal.     Left Ear: Hearing and external ear normal.   Eyes:     General: Lids are normal.     Extraocular Movements: Extraocular movements intact.     Conjunctiva/sclera: Conjunctivae normal.   Neck:     Thyroid : No thyromegaly.     Vascular: No carotid bruit.   Cardiovascular:     Rate and Rhythm: Normal rate and regular rhythm.     Heart sounds: Normal heart sounds. No murmur heard.    No gallop.  Pulmonary:     Effort: No accessory muscle usage or respiratory distress.     Breath sounds: Normal breath sounds.  Abdominal:     General: Bowel sounds are normal. There is no distension.     Palpations: Abdomen is soft.     Tenderness: There is no abdominal tenderness.   Musculoskeletal:     Cervical back: Full passive range of motion without pain.     Right lower leg: No edema.     Left lower leg: No edema.  Lymphadenopathy:     Cervical: No cervical adenopathy.   Skin:    General: Skin is warm.     Capillary Refill: Capillary refill takes less than 2 seconds.   Neurological:     Mental Status: He is alert and oriented to person, place, and time.     Deep Tendon Reflexes: Reflexes are normal and symmetric.     Reflex Scores:      Brachioradialis reflexes are 2+ on the right side and 2+ on the left side.      Patellar reflexes are 2+ on the right side and 2+ on the left side.  Psychiatric:        Attention and Perception: Attention normal.        Mood and Affect: Mood normal.        Speech: Speech normal.        Behavior: Behavior normal. Behavior is cooperative.        Thought Content: Thought content normal.     Results for orders placed or performed in visit on 10/22/23  Bayer DCA Hb A1c Waived   Collection Time: 10/22/23  8:25 AM   Result Value Ref Range   HB A1C (BAYER DCA - WAIVED) 9.0 (H) 4.8 - 5.6 %  Microalbumin, Urine Waived   Collection Time: 10/22/23  8:25 AM  Result Value Ref Range   Microalb, Ur Waived 80 (H) 0 - 19 mg/L   Creatinine, Urine Waived 300 10 - 300 mg/dL   Microalb/Creat Ratio 30-300 (H) <30 mg/g  Comprehensive metabolic panel   Collection Time: 10/22/23  8:26 AM  Result Value Ref Range   Glucose 98 70 - 99 mg/dL   BUN 16 8 - 27 mg/dL   Creatinine, Ser 1.61 (H) 0.76 - 1.27 mg/dL   eGFR 50 (L) >09 UE/AVW/0.98   BUN/Creatinine Ratio 11 10 - 24   Sodium 141 134 - 144 mmol/L   Potassium 4.5 3.5 - 5.2 mmol/L   Chloride 106 96 - 106 mmol/L   CO2 20 20 - 29 mmol/L   Calcium  9.2 8.6 - 10.2 mg/dL   Total Protein 6.4 6.0 - 8.5 g/dL  Albumin 4.3 3.8 - 4.8 g/dL   Globulin, Total 2.1 1.5 - 4.5 g/dL   Bilirubin Total 0.3 0.0 - 1.2 mg/dL   Alkaline Phosphatase 56 44 - 121 IU/L   AST 14 0 - 40 IU/L   ALT 16 0 - 44 IU/L  Lipid Panel w/o Chol/HDL Ratio   Collection Time: 10/22/23  8:26 AM  Result Value Ref Range   Cholesterol, Total 153 100 - 199 mg/dL   Triglycerides 956 (H) 0 - 149 mg/dL   HDL 31 (L) >21 mg/dL   VLDL Cholesterol Cal 40 5 - 40 mg/dL   LDL Chol Calc (NIH) 82 0 - 99 mg/dL  TSH   Collection Time: 10/22/23  8:26 AM  Result Value Ref Range   TSH 1.040 0.450 - 4.500 uIU/mL      Assessment & Plan:   Problem List Items Addressed This Visit       Cardiovascular and Mediastinum   Hypertension associated with type 2 diabetes mellitus (HCC)   Chronic, stable.  BP at goal on check today for age.  Will continue current medication regimen and cardiology collaboration.  Recommend he monitor BP at home at least a few mornings a week and document for provider + focus on DASH diet.  Urine ALB 80 February 2025, continue Ramipril  for kidney protection.  LABS: CMP.      Relevant Medications   rosuvastatin  (CRESTOR ) 40 MG tablet   atenolol  (TENORMIN ) 50 MG tablet   omega-3 acid ethyl  esters (LOVAZA ) 1 g capsule   ramipril  (ALTACE ) 2.5 MG capsule   Other Relevant Orders   Bayer DCA Hb A1c Waived   Comprehensive metabolic panel with GFR   CAD (coronary artery disease)   Chronic, stable at present.  Continue current medication regimen and collaboration with cardiology.      Relevant Medications   rosuvastatin  (CRESTOR ) 40 MG tablet   atenolol  (TENORMIN ) 50 MG tablet   omega-3 acid ethyl esters (LOVAZA ) 1 g capsule   ramipril  (ALTACE ) 2.5 MG capsule     Endocrine   Type 2 diabetes mellitus with proteinuria (HCC)   Chronic, ongoing.  Followed by endocrinology with A1c 9% last check, will recheck today, and urine ALB 80 February 2025 -- continue Ramipril  and Farxiga for kidney protection.  Continue current medication regimen as prescribed by endocrinology.  PharmD collaboration present. Recommend he check BS at least twice a day, fasting and 2 hours after a meal.   - Foot and eye exams up to date - Vaccinations up to date - ACE and Statin on board.      Relevant Medications   rosuvastatin  (CRESTOR ) 40 MG tablet   ramipril  (ALTACE ) 2.5 MG capsule   Other Relevant Orders   Bayer DCA Hb A1c Waived   Type 2 diabetes mellitus with hyperglycemia, with long-term current use of insulin  (HCC) - Primary   Chronic, ongoing.  Followed by endocrinology with A1c 9% last check, will recheck today, and urine ALB 80 February 2025 -- continue Ramipril  and Farxiga for kidney protection.  Continue current medication regimen as prescribed by endocrinology.  PharmD collaboration present. Recommend he check BS at least twice a day, fasting and 2 hours after a meal.   - Foot and eye exams up to date - Vaccinations up to date - ACE and Statin on board.      Relevant Medications   rosuvastatin  (CRESTOR ) 40 MG tablet   ramipril  (ALTACE ) 2.5 MG capsule   Other Relevant Orders  Bayer DCA Hb A1c Waived   Hyperlipidemia associated with type 2 diabetes mellitus (HCC)   Chronic, ongoing.   Continue Atorvastatin , Lovaza , and Fenofibrate , tolerating at this time.  Adjust dose as needed.  Lipid panel today.  Insurance would not cover Repatha .      Relevant Medications   rosuvastatin  (CRESTOR ) 40 MG tablet   atenolol  (TENORMIN ) 50 MG tablet   omega-3 acid ethyl esters (LOVAZA ) 1 g capsule   ramipril  (ALTACE ) 2.5 MG capsule   Other Relevant Orders   Bayer DCA Hb A1c Waived   Comprehensive metabolic panel with GFR   Lipid Panel w/o Chol/HDL Ratio   CKD stage 3 due to type 2 diabetes mellitus (HCC)   Chronic, ongoing CKD 3a to 3 b on checks.  Followed by endocrinology with A1c 9% last check, will recheck today, and urine ALB 80 February 2025 -- continue Ramipril  and Farxiga for kidney protection.  Continue current medication regimen as prescribed by endocrinology.  PharmD collaboration present. Recommend he check BS at least twice a day, fasting and 2 hours after a meal.   - Foot and eye exams up to date - Vaccinations up to date - ACE and Statin on board.      Relevant Medications   rosuvastatin  (CRESTOR ) 40 MG tablet   ramipril  (ALTACE ) 2.5 MG capsule   Other Relevant Orders   Bayer DCA Hb A1c Waived   Comprehensive metabolic panel with GFR     Other   Hypertriglyceridemia   Ongoing issue with statin, Fenofibrate , and Fish Oil on board, will recheck lipid panel today and continue collaboration with cardiology.  Attempted to get Repatha  in past but insurance denied this.       Relevant Medications   rosuvastatin  (CRESTOR ) 40 MG tablet   atenolol  (TENORMIN ) 50 MG tablet   omega-3 acid ethyl esters (LOVAZA ) 1 g capsule   ramipril  (ALTACE ) 2.5 MG capsule   History of non-ST elevation myocardial infarction (NSTEMI)   Having some increase SOB he plans on discussing further with cardiology upcoming, they are aware. Continue current medication regimen as ordered by cardiology.  To stay on ASA indefinitely and Prasugel continues. Strict ER precautions given.        Follow  up plan: Return in about 6 months (around 08/26/2024) for T2DM, HTN/HLD, CKD.

## 2024-02-25 NOTE — Assessment & Plan Note (Signed)
 Chronic, ongoing CKD 3a to 3 b on checks.  Followed by endocrinology with A1c 9% last check, will recheck today, and urine ALB 80 February 2025 -- continue Ramipril  and Farxiga for kidney protection.  Continue current medication regimen as prescribed by endocrinology.  PharmD collaboration present. Recommend he check BS at least twice a day, fasting and 2 hours after a meal.   - Foot and eye exams up to date - Vaccinations up to date - ACE and Statin on board.

## 2024-02-25 NOTE — Progress Notes (Signed)
 Contacted via MyChart  A1c 9.4% on check today.  Definitely take your glucose monitor and medications to endocrinology with you as suspect they will want to adjust regimen:)

## 2024-02-26 LAB — COMPREHENSIVE METABOLIC PANEL WITH GFR
ALT: 15 IU/L (ref 0–44)
AST: 21 IU/L (ref 0–40)
Albumin: 4.5 g/dL (ref 3.8–4.8)
Alkaline Phosphatase: 69 IU/L (ref 44–121)
BUN/Creatinine Ratio: 14 (ref 10–24)
BUN: 29 mg/dL — ABNORMAL HIGH (ref 8–27)
Bilirubin Total: 0.3 mg/dL (ref 0.0–1.2)
CO2: 18 mmol/L — ABNORMAL LOW (ref 20–29)
Calcium: 9.3 mg/dL (ref 8.6–10.2)
Chloride: 106 mmol/L (ref 96–106)
Creatinine, Ser: 2.09 mg/dL — ABNORMAL HIGH (ref 0.76–1.27)
Globulin, Total: 1.8 g/dL (ref 1.5–4.5)
Glucose: 150 mg/dL — ABNORMAL HIGH (ref 70–99)
Potassium: 4.9 mmol/L (ref 3.5–5.2)
Sodium: 140 mmol/L (ref 134–144)
Total Protein: 6.3 g/dL (ref 6.0–8.5)
eGFR: 32 mL/min/{1.73_m2} — ABNORMAL LOW (ref >59)

## 2024-02-26 LAB — LIPID PANEL W/O CHOL/HDL RATIO
Cholesterol, Total: 131 mg/dL (ref 100–199)
HDL: 29 mg/dL — ABNORMAL LOW (ref >39)
LDL Chol Calc (NIH): 55 mg/dL (ref 0–99)
Triglycerides: 300 mg/dL — ABNORMAL HIGH (ref 0–149)
VLDL Cholesterol Cal: 47 mg/dL — ABNORMAL HIGH (ref 5–40)

## 2024-02-26 NOTE — Progress Notes (Signed)
 Contacted via MyChart -- needs 4 week lab only visit please  Good morning [name], your labs have returned: - Lipid panel shows LDL below goal for protection, which is great.  Triglycerides remain elevated.  It may be good to attempt to get Repatha  or Praluent covered again to help control all levels more and perhaps cut back on oral medications.  Do you want to try this? - Kidney function has declined a little from last check.  Creatinine and eGFR. I would like to recheck this outpatient in 4 weeks please, my staff will call to schedule this.  Please ensure to get good water intake daily.  - Remainder of labs stable.  Any questions? Keep being amazing!!  Thank you for allowing me to participate in your care.  I appreciate you. Kindest regards, Mamye Bolds

## 2024-02-28 LAB — HM DIABETES EYE EXAM

## 2024-02-28 NOTE — Progress Notes (Signed)
 Lab appt scheduled.

## 2024-03-13 ENCOUNTER — Other Ambulatory Visit: Payer: Self-pay | Admitting: Nurse Practitioner

## 2024-03-15 NOTE — Telephone Encounter (Signed)
 Requested medication (s) are due for refill today: yes  Requested medication (s) are on the active medication list: yes  Last refill:  08/19/23  Future visit scheduled: yes  Notes to clinic:  Unable to refill per protocol, cannot delegate.      Requested Prescriptions  Pending Prescriptions Disp Refills   neomycin -polymyxin b-dexamethasone  (MAXITROL ) 3.5-10000-0.1 OINT [Pharmacy Med Name: Neomycin -Polymyxin-Dexameth Ophthalmic Ointment 3.5-10000-0.1]  11    Sig: PLACE 1 APPLICATION INTO THE LEFT EYE 4 (FOUR) TIMES DAILY AS DIRECTED     Not Delegated - Ophthalmology:  Corticosteroid & Antimicrobial Combinations Failed - 03/15/2024  8:18 AM      Failed - This refill cannot be delegated      Passed - Valid encounter within last 12 months    Recent Outpatient Visits           2 weeks ago Type 2 diabetes mellitus with hyperglycemia, with long-term current use of insulin  (HCC)   [place] [facility] [place], [place] T, NP   4 months ago Type 2 diabetes mellitus with proteinuria (HCC)   [place] [facility] [place], Melanie DASEN, NP              Signed Prescriptions Disp Refills   Continuous Glucose Receiver (FREESTYLE LIBRE 3 READER) DEVI 1 each 2    Sig: USE AS DIRECTED     Endocrinology: Diabetes - Testing Supplies Passed - 03/15/2024  8:18 AM      Passed - Valid encounter within last 12 months    Recent Outpatient Visits           2 weeks ago Type 2 diabetes mellitus with hyperglycemia, with long-term current use of insulin  (HCC)   [place] [facility] [place], [place] T, NP   4 months ago Type 2 diabetes mellitus with proteinuria (HCC)   [place] [facility] [place], Melanie DASEN, NP

## 2024-03-15 NOTE — Telephone Encounter (Signed)
 Requested Prescriptions  Pending Prescriptions Disp Refills   Continuous Glucose Receiver (FREESTYLE LIBRE 3 READER) DEVI [Pharmacy Med Name: FreeStyle Libre 3 Reader Device] 1 each 2    Sig: USE AS DIRECTED     Endocrinology: Diabetes - Testing Supplies Passed - 03/15/2024  8:17 AM      Passed - Valid encounter within last 12 months    Recent Outpatient Visits           2 weeks ago Type 2 diabetes mellitus with hyperglycemia, with long-term current use of insulin  (HCC)   [place] [facility] [place], [place] T, NP   4 months ago Type 2 diabetes mellitus with proteinuria (HCC)   [place] [facility] [place], Jolene T, NP               neomycin -polymyxin b-dexamethasone  (MAXITROL ) 3.5-10000-0.1 OINT [Pharmacy Med Name: Neomycin -Polymyxin-Dexameth Ophthalmic Ointment 3.5-10000-0.1]  11    Sig: PLACE 1 APPLICATION INTO THE LEFT EYE 4 (FOUR) TIMES DAILY AS DIRECTED     Not Delegated - Ophthalmology:  Corticosteroid & Antimicrobial Combinations Failed - 03/15/2024  8:17 AM      Failed - This refill cannot be delegated      Passed - Valid encounter within last 12 months    Recent Outpatient Visits           2 weeks ago Type 2 diabetes mellitus with hyperglycemia, with long-term current use of insulin  (HCC)   [place] [facility] [place], [place] T, NP   4 months ago Type 2 diabetes mellitus with proteinuria (HCC)   [place] [facility] [place], Melanie DASEN, NP

## 2024-03-21 ENCOUNTER — Encounter: Payer: Self-pay | Admitting: Nurse Practitioner

## 2024-03-27 ENCOUNTER — Other Ambulatory Visit

## 2024-03-27 DIAGNOSIS — N183 Chronic kidney disease, stage 3 unspecified: Secondary | ICD-10-CM

## 2024-03-28 ENCOUNTER — Ambulatory Visit: Payer: Self-pay | Admitting: Nurse Practitioner

## 2024-03-28 LAB — BASIC METABOLIC PANEL WITH GFR
BUN/Creatinine Ratio: 16 (ref 10–24)
BUN: 25 mg/dL (ref 8–27)
CO2: 19 mmol/L — ABNORMAL LOW (ref 20–29)
Calcium: 9.4 mg/dL (ref 8.6–10.2)
Chloride: 103 mmol/L (ref 96–106)
Creatinine, Ser: 1.52 mg/dL — ABNORMAL HIGH (ref 0.76–1.27)
Glucose: 152 mg/dL — ABNORMAL HIGH (ref 70–99)
Potassium: 4.2 mmol/L (ref 3.5–5.2)
Sodium: 139 mmol/L (ref 134–144)
eGFR: 47 mL/min/1.73 — ABNORMAL LOW (ref >59)

## 2024-03-28 NOTE — Progress Notes (Signed)
 Contacted via MyChart  Good afternoon [name], your labs have returned and overall creatinine and eGFR have trended back up to your baseline.  Great news!!

## 2024-05-25 DIAGNOSIS — E1165 Type 2 diabetes mellitus with hyperglycemia: Secondary | ICD-10-CM | POA: Diagnosis not present

## 2024-06-19 NOTE — Progress Notes (Signed)
 [name]                                          MRN: [id number]   06/19/2024   The [facility] Quality Team Specialist reviewed this patient medical record for the purposes of chart review for care gap closure. The following were reviewed: chart review for care gap closure-glycemic status assessment.    [facility] Quality Team

## 2024-07-03 DIAGNOSIS — E785 Hyperlipidemia, unspecified: Secondary | ICD-10-CM | POA: Diagnosis not present

## 2024-07-03 DIAGNOSIS — E1159 Type 2 diabetes mellitus with other circulatory complications: Secondary | ICD-10-CM | POA: Diagnosis not present

## 2024-07-03 DIAGNOSIS — E1165 Type 2 diabetes mellitus with hyperglycemia: Secondary | ICD-10-CM | POA: Diagnosis not present

## 2024-07-03 DIAGNOSIS — Z794 Long term (current) use of insulin: Secondary | ICD-10-CM | POA: Diagnosis not present

## 2024-07-03 DIAGNOSIS — E1169 Type 2 diabetes mellitus with other specified complication: Secondary | ICD-10-CM | POA: Diagnosis not present

## 2024-07-03 DIAGNOSIS — I152 Hypertension secondary to endocrine disorders: Secondary | ICD-10-CM | POA: Diagnosis not present

## 2024-08-02 ENCOUNTER — Other Ambulatory Visit: Payer: Self-pay | Admitting: Nurse Practitioner

## 2024-08-03 ENCOUNTER — Telehealth: Payer: Self-pay

## 2024-08-03 NOTE — Telephone Encounter (Signed)
 Gave pt a call pt is coming up due for re-enrollment on [employer] Farxiga , left a HIPAA VM.

## 2024-08-04 NOTE — Telephone Encounter (Signed)
 Requested by interface surescripts. Future visit 08/28/24. Requested Prescriptions  Pending Prescriptions Disp Refills   ezetimibe  (ZETIA ) 10 MG tablet [Pharmacy Med Name: EZETIMIBE  10 MG Oral Tablet] 90 tablet 0    Sig: TAKE 1 TABLET EVERY DAY     There is no refill protocol information for this order

## 2024-08-07 NOTE — Telephone Encounter (Signed)
 Pt call back today, will come in on 12/3 to sign in at provider office, faxed provider portion.

## 2024-08-11 NOTE — Telephone Encounter (Signed)
 Received provider portion [employer] Farxiga  waiting on pt portion

## 2024-08-15 DIAGNOSIS — Z951 Presence of aortocoronary bypass graft: Secondary | ICD-10-CM | POA: Diagnosis not present

## 2024-08-15 DIAGNOSIS — E785 Hyperlipidemia, unspecified: Secondary | ICD-10-CM | POA: Diagnosis not present

## 2024-08-15 DIAGNOSIS — I10 Essential (primary) hypertension: Secondary | ICD-10-CM | POA: Diagnosis not present

## 2024-08-15 DIAGNOSIS — E782 Mixed hyperlipidemia: Secondary | ICD-10-CM | POA: Diagnosis not present

## 2024-08-15 DIAGNOSIS — E1169 Type 2 diabetes mellitus with other specified complication: Secondary | ICD-10-CM | POA: Diagnosis not present

## 2024-08-24 ENCOUNTER — Other Ambulatory Visit: Payer: Self-pay | Admitting: Medical Genetics

## 2024-08-24 NOTE — Telephone Encounter (Signed)
 Received pt portion [employer] Farxiga  faxed to [employer] along provider portion today.

## 2024-08-26 NOTE — Patient Instructions (Incomplete)

## 2024-08-28 ENCOUNTER — Encounter: Payer: Self-pay | Admitting: Nurse Practitioner

## 2024-08-28 ENCOUNTER — Ambulatory Visit: Admitting: Nurse Practitioner

## 2024-08-28 VITALS — BP 96/47 | HR 54 | Temp 98.2°F | Resp 14 | Ht 72.01 in | Wt 177.2 lb

## 2024-08-28 DIAGNOSIS — F5101 Primary insomnia: Secondary | ICD-10-CM

## 2024-08-28 DIAGNOSIS — E781 Pure hyperglyceridemia: Secondary | ICD-10-CM | POA: Diagnosis not present

## 2024-08-28 DIAGNOSIS — E1169 Type 2 diabetes mellitus with other specified complication: Secondary | ICD-10-CM

## 2024-08-28 DIAGNOSIS — E1159 Type 2 diabetes mellitus with other circulatory complications: Secondary | ICD-10-CM

## 2024-08-28 DIAGNOSIS — E1122 Type 2 diabetes mellitus with diabetic chronic kidney disease: Secondary | ICD-10-CM

## 2024-08-28 DIAGNOSIS — Z23 Encounter for immunization: Secondary | ICD-10-CM

## 2024-08-28 DIAGNOSIS — N183 Chronic kidney disease, stage 3 unspecified: Secondary | ICD-10-CM

## 2024-08-28 DIAGNOSIS — I252 Old myocardial infarction: Secondary | ICD-10-CM

## 2024-08-28 DIAGNOSIS — K219 Gastro-esophageal reflux disease without esophagitis: Secondary | ICD-10-CM

## 2024-08-28 DIAGNOSIS — E1129 Type 2 diabetes mellitus with other diabetic kidney complication: Secondary | ICD-10-CM | POA: Diagnosis not present

## 2024-08-28 DIAGNOSIS — I152 Hypertension secondary to endocrine disorders: Secondary | ICD-10-CM

## 2024-08-28 DIAGNOSIS — Z794 Long term (current) use of insulin: Secondary | ICD-10-CM | POA: Diagnosis not present

## 2024-08-28 DIAGNOSIS — E1165 Type 2 diabetes mellitus with hyperglycemia: Secondary | ICD-10-CM

## 2024-08-28 DIAGNOSIS — E538 Deficiency of other specified B group vitamins: Secondary | ICD-10-CM

## 2024-08-28 MED ORDER — PRASUGREL HCL 10 MG PO TABS: 10.0000 mg | ORAL_TABLET | Freq: Every day | ORAL | 4 refills | Status: DC

## 2024-08-28 MED ORDER — EZETIMIBE 10 MG PO TABS: 10.0000 mg | ORAL_TABLET | Freq: Every day | ORAL | 3 refills | Status: DC

## 2024-08-28 MED ORDER — BELSOMRA 10 MG PO TABS: 10.0000 mg | ORAL_TABLET | Freq: Every evening | ORAL | 3 refills | Status: DC | PRN

## 2024-08-28 MED ORDER — METFORMIN HCL 500 MG PO TABS: ORAL_TABLET | ORAL | 4 refills | Status: AC

## 2024-08-28 MED ORDER — PANTOPRAZOLE SODIUM 20 MG PO TBEC: DELAYED_RELEASE_TABLET | ORAL | 4 refills | Status: DC

## 2024-08-28 MED ORDER — ATENOLOL 50 MG PO TABS: 50.0000 mg | ORAL_TABLET | Freq: Every day | ORAL | 3 refills | Status: DC

## 2024-08-28 MED ORDER — AMLODIPINE BESYLATE 5 MG PO TABS: 2.5000 mg | ORAL_TABLET | Freq: Every day | ORAL | Status: DC

## 2024-08-28 NOTE — Assessment & Plan Note (Signed)
 Ongoing issue with statin, Fenofibrate , Zetia , and Fish Oil on board, will recheck lipid panel today and continue collaboration with cardiology.  Attempted to get Repatha  in past but insurance denied this, cardiology recently ordered this awaiting decision for coverage.

## 2024-08-28 NOTE — Progress Notes (Signed)
 BP (!) 96/47 (BP Location: Left Arm, Patient Position: Sitting, Cuff Size: Normal)   Pulse (!) 54   Temp 98.2 F (36.8 C) (Oral)   Resp 14   Ht 6' 0.01 (1.829 m)   Wt 177 lb 3.2 oz (80.4 kg)   SpO2 99%   BMI 24.03 kg/m    Subjective:    Patient ID: [name], male    DOB: 1951-01-19, 73 y.o.   MRN: [id number]  HPI: [name] is a 73 y.o. male  Chief Complaint  Patient presents with   Diabetes    On insulin  pump and uses the dexcom system.    Chronic Kidney Disease    Ast saw them about 6 weeks ago and no concerns.    Hypertension    Has had a few times the bottom number has been low in the 40's.    Hyperlipidemia    Hypertriglyceridemia says cardiology is working with insurance to see if he can start on twice monthly Repatha .    Insomnia    Started about a month or so ago. Maxed out on sleeping pills and using CBD gummy on doesn't seem to be helping any.    DIABETES Was diagnosed >10 years ago.  Follows with endo, Dr. Cherilyn, with last visit on 07/03/24 when Glipizide  was stopped.  A1c in October 7.6%.  Taking insulin  pump, Farxiga (gets vis assistance), and Metformin .  Maintaining his weight loss. DEXCOM over 90 days: average glucose 168, 65% in range, 34% high, <1% low. Takes B12 for history of low levels. Hypoglycemic episodes:no Polydipsia/polyuria: no Visual disturbance: no Chest pain: no Paresthesias: no Glucose Monitoring: yes             Accucheck frequency: uses DEXCOM as above             Fasting glucose:              Post prandial:             Evening:             Before meals: Taking Insulin ?: yes -- insulin  pump as per endo             Long acting insulin :              Short acting insulin : Blood Pressure Monitoring: not checking Retinal Examination: Up To Date -- [facility], goes on Monday Foot Exam: Up to Date Pneumovax: Up to Date Influenza: Up to Date Aspirin : yes    CHRONIC KIDNEY DISEASE (CKD 3b) Taking Protonix  for  reflux issues. CKD status: stable   Medications renally dose: yes Previous renal evaluation: no Pneumovax:  Up to Date Influenza Vaccine:  Up to Date     Latest Ref Rng & Units 03/27/2024    9:01 AM 02/25/2024    8:39 AM 10/22/2023    8:26 AM  BMP  Glucose 70 - 99 mg/dL 847  849  98   BUN 8 - 27 mg/dL 25  29  16    Creatinine 0.76 - 1.27 mg/dL 8.47  7.90  8.52   BUN/Creat Ratio 10 - 24 16  14  11    Sodium 134 - 144 mmol/L 139  140  141   Potassium 3.5 - 5.2 mmol/L 4.2  4.9  4.5   Chloride 96 - 106 mmol/L 103  106  106   CO2 20 - 29 mmol/L 19  18  20    Calcium  8.6 - 10.2 mg/dL 9.4  9.3  9.2     HYPERTENSION / HYPERLIPIDEMIA Takes Ramipril , Atenolol , Amlodipine , ASA, Fenofibrate , Rosuvastatin , Lovaza , Effient , and Plavix . To continue dual anti-platelet therapy.  Echo performed 03/28/24 with EF >55%. History of CABG in 1998, stent in 2006, catheterization in 2011. NSTEMI on 07/18/23. Saw cardiology last on 08/15/24 when Repatha  was ordered, we tried to get coverage for this in past.  No NTG use recently.  Quit smoking in 1998, smoked about 2 PPD.   Satisfied with current treatment? yes Duration of hypertension: chronic BP monitoring frequency: occasional BP range:  BP medication side effects: no Duration of hyperlipidemia: chronic Cholesterol medication side effects: no Cholesterol supplements: none Medication compliance: good compliance Aspirin : yes Recent stressors: no Recurrent headaches: no Visual changes: no Palpitations: no Dyspnea: no Chest pain:no Lower extremity edema: no Dizzy/lightheaded: occasionally if BP gets too low   INSOMNIA Takes Amitriptyline  50 MG at bedtime. Has tried Trazodone, Ambien , Melatonin in past. 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 Duration: chronic Satisfied with sleep quality: no Difficulty falling asleep: yes Difficulty staying asleep: yes Waking a few hours after sleep onset: yes Early morning awakenings: no Daytime hypersomnolence:  no Wakes feeling refreshed: yes Good sleep hygiene: yes Apnea: no Snoring: no Depressed/anxious mood: no Recent stress: no Restless legs/nocturnal leg cramps: no Chronic pain/arthritis: no History of sleep study: yes in past, was negative Treatments attempted: Ambien , Trazodone, Melatonin     08/28/2024    8:53 AM 10/22/2023    8:24 AM 10/14/2023    2:30 PM 07/23/2023    2:10 PM 04/02/2023   10:00 AM  Depression screen PHQ 2/9  Decreased Interest 0 0 0 0 2  Down, Depressed, Hopeless 0 0 0 0 0  PHQ - 2 Score 0 0 0 0 2  Altered sleeping 0 0  0 0  Tired, decreased energy 0 0  1 2  Change in appetite 0 0  0 1  Feeling bad or failure about yourself  0 0  0 0  Trouble concentrating 0 0  0 0  Moving slowly or fidgety/restless 0 0  0 0  Suicidal thoughts 0 0  0 0  PHQ-9 Score 0 0   1  5   Difficult doing work/chores  Not difficult at all  Not difficult at all Not difficult at all     Data saved with a previous flowsheet row definition       08/28/2024    8:53 AM 10/22/2023    8:56 AM 07/23/2023    2:11 PM 04/02/2023   10:00 AM  GAD 7 : Generalized Anxiety Score  Nervous, Anxious, on Edge 0 0 1 0  Control/stop worrying 0 0 0 0  Worry too much - different things 0 0 0 0  Trouble relaxing 0 0 0 0  Restless 0 0 0 0  Easily annoyed or irritable 0 0 0 0  Afraid - awful might happen 0 0 0 0  Total GAD 7 Score 0 0 1 0  Anxiety Difficulty  Not difficult at all Not difficult at all Not difficult at all   Relevant past medical, surgical, family and social history reviewed and updated as indicated. Interim medical history since our last visit reviewed. Allergies and medications reviewed and updated.  Review of Systems  Constitutional:  Negative for activity change, diaphoresis, fatigue and fever.  Respiratory:  Negative for cough, chest tightness, shortness of breath and wheezing.   Cardiovascular:  Negative for chest pain, palpitations and leg swelling.  Gastrointestinal: Negative.    Endocrine: Negative for cold intolerance, heat intolerance, polydipsia, polyphagia and polyuria.  Neurological: Negative.   Psychiatric/Behavioral: Negative.      Per HPI unless specifically indicated above     Objective:    BP (!) 96/47 (BP Location: Left Arm, Patient Position: Sitting, Cuff Size: Normal)   Pulse (!) 54   Temp 98.2 F (36.8 C) (Oral)   Resp 14   Ht 6' 0.01 (1.829 m)   Wt 177 lb 3.2 oz (80.4 kg)   SpO2 99%   BMI 24.03 kg/m   Wt Readings from Last 3 Encounters:  08/28/24 177 lb 3.2 oz (80.4 kg)  02/25/24 178 lb 9.6 oz (81 kg)  10/22/23 182 lb 3.2 oz (82.6 kg)    Physical Exam Vitals and nursing note reviewed.  Constitutional:      General: He is awake. He is not in acute distress.    Appearance: He is well-developed and well-groomed. He is not ill-appearing or toxic-appearing.  HENT:     Head: Normocephalic.     Right Ear: Hearing and external ear normal.     Left Ear: Hearing and external ear normal.  Eyes:     General: Lids are normal.     Extraocular Movements: Extraocular movements intact.     Conjunctiva/sclera: Conjunctivae normal.  Neck:     Thyroid : No thyromegaly.     Vascular: No carotid bruit.  Cardiovascular:     Rate and Rhythm: Normal rate and regular rhythm.     Heart sounds: Normal heart sounds. No murmur heard.    No gallop.  Pulmonary:     Effort: No accessory muscle usage or respiratory distress.     Breath sounds: Normal breath sounds.  Abdominal:     General: Bowel sounds are normal. There is no distension.     Palpations: Abdomen is soft.     Tenderness: There is no abdominal tenderness.  Musculoskeletal:     Cervical back: Full passive range of motion without pain.     Right lower leg: No edema.     Left lower leg: No edema.  Lymphadenopathy:     Cervical: No cervical adenopathy.  Skin:    General: Skin is warm.     Capillary Refill: Capillary refill takes less than 2 seconds.  Neurological:     Mental Status: He  is alert and oriented to person, place, and time.     Deep Tendon Reflexes: Reflexes are normal and symmetric.     Reflex Scores:      Brachioradialis reflexes are 2+ on the right side and 2+ on the left side.      Patellar reflexes are 2+ on the right side and 2+ on the left side. Psychiatric:        Attention and Perception: Attention normal.        Mood and Affect: Mood normal.        Speech: Speech normal.        Behavior: Behavior normal. Behavior is cooperative.        Thought Content: Thought content normal.    Diabetic Foot Exam - Simple   Simple Foot Form Visual Inspection No deformities, no ulcerations, no other skin breakdown bilaterally: Yes Sensation Testing Intact to touch and monofilament testing bilaterally: Yes Pulse Check Posterior Tibialis and Dorsalis pulse intact bilaterally: Yes Comments     Results for orders placed or performed in visit on 03/27/24  Basic metabolic panel with GFR   Collection  Time: 03/27/24  9:01 AM  Result Value Ref Range   Glucose 152 (H) 70 - 99 mg/dL   BUN 25 8 - 27 mg/dL   Creatinine, Ser 8.47 (H) 0.76 - 1.27 mg/dL   eGFR 47 (L) >40 fO/fpw/8.26   BUN/Creatinine Ratio 16 10 - 24   Sodium 139 134 - 144 mmol/L   Potassium 4.2 3.5 - 5.2 mmol/L   Chloride 103 96 - 106 mmol/L   CO2 19 (L) 20 - 29 mmol/L   Calcium  9.4 8.6 - 10.2 mg/dL      Assessment & Plan:   Problem List Items Addressed This Visit       Cardiovascular and Mediastinum   Hypertension associated with type 2 diabetes mellitus (HCC)   Chronic, stable.  BP on lower side at home and in office, with occasional dizziness.  Will continue current medication regimen, with exception of lower Amlodipine  to 2.5 MG daily and if in one week BP remains <100/60 then trial without this on board. Educated him on this change.  Recommend he monitor BP at home at least a few mornings a week and document for provider + focus on DASH diet.  Urine ALB 80 February 2025, continue Ramipril   for kidney protection.  LABS: CBC, CMP.      Relevant Medications   Evolocumab  (REPATHA  SURECLICK) 140 MG/ML SOAJ   insulin  lispro (HUMALOG) 100 UNIT/ML injection   ramipril  (ALTACE ) 5 MG capsule   amLODipine  (NORVASC ) 5 MG tablet   ezetimibe  (ZETIA ) 10 MG tablet   atenolol  (TENORMIN ) 50 MG tablet   metFORMIN  (GLUCOPHAGE ) 500 MG tablet     Digestive   GERD (gastroesophageal reflux disease)   Chronic, ongoing, stable with daily PPI.  Would benefit from trial reduction in future, discussed with patient as magnesium continues to be low.  At this time maintain and monitor.  Mag level today. Risks of PPI use were discussed with patient including bone loss, C. Diff diarrhea, pneumonia, infections, CKD, electrolyte abnormalities.  Verbalizes understanding and chooses to continue the medication.       Relevant Medications   pantoprazole  (PROTONIX ) 20 MG tablet   Other Relevant Orders   Magnesium     Endocrine   Type 2 diabetes mellitus with proteinuria (HCC)   Chronic, ongoing.  Followed by endocrinology with A1c 7.6% last check and urine ALB 80 February 2025 -- continue Ramipril  and Farxiga for kidney protection.  Continue current medication regimen as prescribed by endocrinology.  PharmD collaboration present. Recommend he continue Dexcom use and bring to visits. - Foot and eye exams up to date - Vaccinations up to date - ACE and Statin on board.      Relevant Medications   insulin  lispro (HUMALOG) 100 UNIT/ML injection   ramipril  (ALTACE ) 5 MG capsule   metFORMIN  (GLUCOPHAGE ) 500 MG tablet   Type 2 diabetes mellitus with hyperglycemia, with long-term current use of insulin  (HCC) - Primary   Chronic, ongoing.  Followed by endocrinology with A1c 7.6% last check and urine ALB 80 February 2025 -- continue Ramipril  and Farxiga for kidney protection.  Continue current medication regimen as prescribed by endocrinology.  PharmD collaboration present. Recommend he continue Dexcom use and bring  to visits. - Foot and eye exams up to date - Vaccinations up to date - ACE and Statin on board.      Relevant Medications   insulin  lispro (HUMALOG) 100 UNIT/ML injection   ramipril  (ALTACE ) 5 MG capsule   metFORMIN  (GLUCOPHAGE ) 500 MG tablet  Hyperlipidemia associated with type 2 diabetes mellitus (HCC)   Chronic, ongoing.  Continue current medication regimen for now. Attempted to get Repatha  in past but insurance denied this, cardiology recently ordered this awaiting decision for coverage. Adjust dose as needed. Lipid panel today.        Relevant Medications   Evolocumab  (REPATHA  SURECLICK) 140 MG/ML SOAJ   insulin  lispro (HUMALOG) 100 UNIT/ML injection   ramipril  (ALTACE ) 5 MG capsule   amLODipine  (NORVASC ) 5 MG tablet   ezetimibe  (ZETIA ) 10 MG tablet   atenolol  (TENORMIN ) 50 MG tablet   metFORMIN  (GLUCOPHAGE ) 500 MG tablet   CKD stage 3 due to type 2 diabetes mellitus (HCC)   Chronic, ongoing CKD 3b on checks.  Followed by endocrinology with A1c 7.6% last check and urine ALB 80 February 2025 -- continue Ramipril  and Farxiga for kidney protection.  Continue current medication regimen as prescribed by endocrinology.  PharmD collaboration present. Recommend he continue Dexcom use and bring to visits. - Foot and eye exams up to date - Vaccinations up to date - ACE and Statin on board.      Relevant Medications   insulin  lispro (HUMALOG) 100 UNIT/ML injection   ramipril  (ALTACE ) 5 MG capsule   metFORMIN  (GLUCOPHAGE ) 500 MG tablet   Other Relevant Orders   Comprehensive metabolic panel with GFR     Other   Vitamin B12 deficiency   Chronic, stable on recent recheck.  Continue daily supplement and recheck level today.      Relevant Orders   Vitamin B12   CBC with Differential/Platelet   Insomnia   Chronic, ongoing.  Would avoid Ambien  and benzo use due to age and BEERS. Will see if can get coverage for Belsomra , if too costly consider trial of Lunesta. Would not increase  Amtriptyline any further. Continue to monitor and adjust regimen as needed.  Recommend sleep hygiene techniques.  Could consider therapy for CBT in future.        Hypertriglyceridemia   Ongoing issue with statin, Fenofibrate , Zetia , and Fish Oil on board, will recheck lipid panel today and continue collaboration with cardiology.  Attempted to get Repatha  in past but insurance denied this, cardiology recently ordered this awaiting decision for coverage.      Relevant Medications   Evolocumab  (REPATHA  SURECLICK) 140 MG/ML SOAJ   ramipril  (ALTACE ) 5 MG capsule   amLODipine  (NORVASC ) 5 MG tablet   ezetimibe  (ZETIA ) 10 MG tablet   atenolol  (TENORMIN ) 50 MG tablet   History of non-ST elevation myocardial infarction (NSTEMI)   Stable at present. Continue current medication regimen and collaboration with cardiology.        Follow up plan: Return in about 6 months (around 02/26/2025) for T2DM, HTN/HLD, CKD, Insomnia + 4 weeks for insomnia visit.

## 2024-08-28 NOTE — Assessment & Plan Note (Signed)
 Chronic, ongoing.  Followed by endocrinology with A1c 7.6% last check and urine ALB 80 February 2025 -- continue Ramipril  and Farxiga for kidney protection.  Continue current medication regimen as prescribed by endocrinology.  PharmD collaboration present. Recommend he continue Dexcom use and bring to visits. - Foot and eye exams up to date - Vaccinations up to date - ACE and Statin on board.

## 2024-08-28 NOTE — Assessment & Plan Note (Signed)
 Chronic, stable.  BP on lower side at home and in office, with occasional dizziness.  Will continue current medication regimen, with exception of lower Amlodipine  to 2.5 MG daily and if in one week BP remains <100/60 then trial without this on board. Educated him on this change.  Recommend he monitor BP at home at least a few mornings a week and document for provider + focus on DASH diet.  Urine ALB 80 February 2025, continue Ramipril  for kidney protection.  LABS: CBC, CMP.

## 2024-08-28 NOTE — Assessment & Plan Note (Signed)
 Chronic, ongoing CKD 3b on checks.  Followed by endocrinology with A1c 7.6% last check and urine ALB 80 February 2025 -- continue Ramipril  and Farxiga for kidney protection.  Continue current medication regimen as prescribed by endocrinology.  PharmD collaboration present. Recommend he continue Dexcom use and bring to visits. - Foot and eye exams up to date - Vaccinations up to date - ACE and Statin on board.

## 2024-08-28 NOTE — Assessment & Plan Note (Signed)
Chronic, ongoing, stable with daily PPI.  Would benefit from trial reduction in future, discussed with patient as magnesium continues to be low.  At this time maintain and monitor.  Mag level today. Risks of PPI use were discussed with patient including bone loss, C. Diff diarrhea, pneumonia, infections, CKD, electrolyte abnormalities.  Verbalizes understanding and chooses to continue the medication.

## 2024-08-28 NOTE — Assessment & Plan Note (Signed)
 Chronic, ongoing.  Continue current medication regimen for now. Attempted to get Repatha  in past but insurance denied this, cardiology recently ordered this awaiting decision for coverage. Adjust dose as needed. Lipid panel today.

## 2024-08-28 NOTE — Assessment & Plan Note (Signed)
Chronic, stable on recent recheck.  Continue daily supplement and recheck level today. 

## 2024-08-28 NOTE — Assessment & Plan Note (Signed)
 Chronic, ongoing.  Would avoid Ambien  and benzo use due to age and BEERS. Will see if can get coverage for Belsomra , if too costly consider trial of Lunesta. Would not increase Amtriptyline any further. Continue to monitor and adjust regimen as needed.  Recommend sleep hygiene techniques.  Could consider therapy for CBT in future.

## 2024-08-28 NOTE — Assessment & Plan Note (Signed)
 Stable at present. Continue current medication regimen and collaboration with cardiology.

## 2024-08-29 ENCOUNTER — Ambulatory Visit: Payer: Self-pay | Admitting: Nurse Practitioner

## 2024-08-29 LAB — CBC WITH DIFFERENTIAL/PLATELET
Basophils Absolute: 0 x10E3/uL (ref 0.0–0.2)
Basos: 1 %
EOS (ABSOLUTE): 0.5 x10E3/uL — ABNORMAL HIGH (ref 0.0–0.4)
Eos: 10 %
Hematocrit: 47.5 % (ref 37.5–51.0)
Hemoglobin: 15.5 g/dL (ref 13.0–17.7)
Immature Grans (Abs): 0 x10E3/uL (ref 0.0–0.1)
Immature Granulocytes: 0 %
Lymphocytes Absolute: 1.7 x10E3/uL (ref 0.7–3.1)
Lymphs: 30 %
MCH: 30.1 pg (ref 26.6–33.0)
MCHC: 32.6 g/dL (ref 31.5–35.7)
MCV: 92 fL (ref 79–97)
Monocytes Absolute: 0.4 x10E3/uL (ref 0.1–0.9)
Monocytes: 7 %
Neutrophils Absolute: 2.9 x10E3/uL (ref 1.4–7.0)
Neutrophils: 52 %
Platelets: 205 x10E3/uL (ref 150–450)
RBC: 5.15 x10E6/uL (ref 4.14–5.80)
RDW: 12.9 % (ref 11.6–15.4)
WBC: 5.5 x10E3/uL (ref 3.4–10.8)

## 2024-08-29 LAB — COMPREHENSIVE METABOLIC PANEL WITH GFR
ALT: 14 IU/L (ref 0–44)
AST: 15 IU/L (ref 0–40)
Albumin: 4.3 g/dL (ref 3.8–4.8)
Alkaline Phosphatase: 54 IU/L (ref 47–123)
BUN/Creatinine Ratio: 17 (ref 10–24)
BUN: 32 mg/dL — ABNORMAL HIGH (ref 8–27)
Bilirubin Total: 0.3 mg/dL (ref 0.0–1.2)
CO2: 18 mmol/L — ABNORMAL LOW (ref 20–29)
Calcium: 9.5 mg/dL (ref 8.6–10.2)
Chloride: 105 mmol/L (ref 96–106)
Creatinine, Ser: 1.89 mg/dL — ABNORMAL HIGH (ref 0.76–1.27)
Globulin, Total: 2 g/dL (ref 1.5–4.5)
Glucose: 142 mg/dL — ABNORMAL HIGH (ref 70–99)
Potassium: 4.7 mmol/L (ref 3.5–5.2)
Sodium: 141 mmol/L (ref 134–144)
Total Protein: 6.3 g/dL (ref 6.0–8.5)
eGFR: 37 mL/min/1.73 — ABNORMAL LOW (ref >59)

## 2024-08-29 LAB — MAGNESIUM: Magnesium: 1.8 mg/dL (ref 1.6–2.3)

## 2024-08-29 LAB — VITAMIN B12: Vitamin B-12: >2000 pg/mL — ABNORMAL HIGH (ref 232–1245)

## 2024-08-29 NOTE — Telephone Encounter (Signed)
 Received approval letter from [employer] [place]) approval letter index.

## 2024-08-29 NOTE — Progress Notes (Signed)
 Contacted via MyChart  Good afternoon [name], your labs have returned: - B12 level a little too high, reduce supplement to a few days a week only. - CBC is stable. - Kidney function shows a little trend down, we will recheck next visit and determine if need to get you into kidney doctor. Ensure good water intake at home and diabetes control. - Magnesium level normal. Any questions? Keep being amazing!!  Thank you for allowing me to participate in your care.  I appreciate you. Kindest regards, Winni Ehrhard

## 2024-09-15 ENCOUNTER — Encounter: Payer: Self-pay | Admitting: Nurse Practitioner

## 2024-09-15 ENCOUNTER — Other Ambulatory Visit: Payer: Self-pay | Admitting: Nurse Practitioner

## 2024-09-15 MED ORDER — ATENOLOL 50 MG PO TABS: 50.0000 mg | ORAL_TABLET | Freq: Every day | ORAL | 3 refills | Status: AC

## 2024-09-15 MED ORDER — OMEGA-3-ACID ETHYL ESTERS 1 G PO CAPS: 2.0000 | ORAL_CAPSULE | Freq: Two times a day (BID) | ORAL | 4 refills | Status: AC

## 2024-09-15 MED ORDER — TRAZODONE HCL 50 MG PO TABS: 25.0000 mg | ORAL_TABLET | Freq: Every evening | ORAL | 3 refills | Status: DC | PRN

## 2024-09-15 MED ORDER — PRASUGREL HCL 10 MG PO TABS: 10.0000 mg | ORAL_TABLET | Freq: Every day | ORAL | 4 refills | Status: AC

## 2024-09-15 MED ORDER — EZETIMIBE 10 MG PO TABS: 10.0000 mg | ORAL_TABLET | Freq: Every day | ORAL | 3 refills | Status: AC

## 2024-09-15 MED ORDER — PANTOPRAZOLE SODIUM 20 MG PO TBEC: DELAYED_RELEASE_TABLET | ORAL | 4 refills | Status: AC

## 2024-09-15 MED ORDER — AMLODIPINE BESYLATE 5 MG PO TABS: 2.5000 mg | ORAL_TABLET | Freq: Every day | ORAL | 3 refills | Status: AC

## 2024-09-15 MED ORDER — FENOFIBRATE 145 MG PO TABS: 145.0000 mg | ORAL_TABLET | Freq: Every day | ORAL | 4 refills | Status: AC

## 2024-09-23 NOTE — Patient Instructions (Signed)
 Insomnia Insomnia is a sleep disorder that makes it difficult to fall asleep or stay asleep. Insomnia can cause fatigue, low energy, difficulty concentrating, mood swings, and poor performance at work or school. There are three different ways to classify insomnia: Difficulty falling asleep. Difficulty staying asleep. Waking up too early in the morning. Any type of insomnia can be long-term (chronic) or short-term (acute). Both are common. Short-term insomnia usually lasts for 3 months or less. Chronic insomnia occurs at least three times a week for longer than 3 months. What are the causes? Insomnia may be caused by another condition, situation, or substance, such as: Having certain mental health conditions, such as anxiety and depression. Using caffeine, alcohol , tobacco, or drugs. Having gastrointestinal conditions, such as gastroesophageal reflux disease (GERD). Having certain medical conditions. These include: Asthma. Alzheimer's disease. Stroke. Chronic pain. An overactive thyroid  gland (hyperthyroidism). Other sleep disorders, such as restless legs syndrome and sleep apnea. Menopause. Sometimes, the cause of insomnia may not be known. What increases the risk? Risk factors for insomnia include: Gender. Females are affected more often than males. Age. Insomnia is more common as people get older. Stress and certain medical and mental health conditions. Lack of exercise. Having an irregular work schedule. This may include working night shifts and traveling between different time zones. What are the signs or symptoms? If you have insomnia, the main symptom is having trouble falling asleep or having trouble staying asleep. This may lead to other symptoms, such as: Feeling tired or having low energy. Feeling nervous about going to sleep. Not feeling rested in the morning. Having trouble concentrating. Feeling irritable, anxious, or depressed. How is this diagnosed? This condition  may be diagnosed based on: Your symptoms and medical history. Your health care provider may ask about: Your sleep habits. Any medical conditions you have. Your mental health. A physical exam. How is this treated? Treatment for insomnia depends on the cause. Treatment may focus on treating an underlying condition that is causing the insomnia. Treatment may also include: Medicines to help you sleep. Counseling or therapy. Lifestyle adjustments to help you sleep better. Follow these instructions at home: Eating and drinking  Limit or avoid alcohol , caffeinated beverages, and products that contain nicotine and tobacco, especially close to bedtime. These can disrupt your sleep. Do not eat a large meal or eat spicy foods right before bedtime. This can lead to digestive discomfort that can make it hard for you to sleep. Sleep habits  Keep a sleep diary to help you and your health care provider figure out what could be causing your insomnia. Write down: When you sleep. When you wake up during the night. How well you sleep and how rested you feel the next day. Any side effects of medicines you are taking. What you eat and drink. Make your bedroom a dark, comfortable place where it is easy to fall asleep. Put up shades or blackout curtains to block light from outside. Use a white noise machine to block noise. Keep the temperature cool. Limit screen use before bedtime. This includes: Not watching TV. Not using your smartphone, tablet, or computer. Stick to a routine that includes going to bed and waking up at the same times every day and night. This can help you fall asleep faster. Consider making a quiet activity, such as reading, part of your nighttime routine. Try to avoid taking naps during the day so that you sleep better at night. Get out of bed if you are still awake after  15 minutes of trying to sleep. Keep the lights down, but try reading or doing a quiet activity. When you feel  sleepy, go back to bed. General instructions Take over-the-counter and prescription medicines only as told by your health care provider. Exercise regularly as told by your health care provider. However, avoid exercising in the hours right before bedtime. Use relaxation techniques to manage stress. Ask your health care provider to suggest some techniques that may work well for you. These may include: Breathing exercises. Routines to release muscle tension. Visualizing peaceful scenes. Make sure that you drive carefully. Do not drive if you feel very sleepy. Keep all follow-up visits. This is important. Contact a health care provider if: You are tired throughout the day. You have trouble in your daily routine due to sleepiness. You continue to have sleep problems, or your sleep problems get worse. Get help right away if: You have thoughts about hurting yourself or someone else. Get help right away if you feel like you may hurt yourself or others, or have thoughts about taking your own life. Go to your nearest emergency room or: Call 911. Call the National Suicide Prevention Lifeline at 2232757840 or 988. This is open 24 hours a day. Text the Crisis Text Line at 657-529-4371. Summary Insomnia is a sleep disorder that makes it difficult to fall asleep or stay asleep. Insomnia can be long-term (chronic) or short-term (acute). Treatment for insomnia depends on the cause. Treatment may focus on treating an underlying condition that is causing the insomnia. Keep a sleep diary to help you and your health care provider figure out what could be causing your insomnia. This information is not intended to replace advice given to you by your health care provider. Make sure you discuss any questions you have with your health care provider. Document Revised: 08/11/2021 Document Reviewed: 08/11/2021 Elsevier Patient Education  2024 [employer].

## 2024-09-25 ENCOUNTER — Other Ambulatory Visit
Admission: RE | Admit: 2024-09-25 | Discharge: 2024-09-25 | Disposition: A | Discharge status: Home / Self Care | Payer: Self-pay | Source: Ambulatory Visit | Attending: Medical Genetics | Admitting: Medical Genetics

## 2024-09-28 ENCOUNTER — Encounter: Payer: Self-pay | Admitting: Nurse Practitioner

## 2024-09-28 ENCOUNTER — Ambulatory Visit (INDEPENDENT_AMBULATORY_CARE_PROVIDER_SITE_OTHER): Admitting: Nurse Practitioner

## 2024-09-28 VITALS — BP 129/68 | HR 60 | Temp 97.4°F | Resp 17 | Ht 72.01 in | Wt 176.2 lb

## 2024-09-28 DIAGNOSIS — F5101 Primary insomnia: Secondary | ICD-10-CM

## 2024-09-28 MED ORDER — MIRTAZAPINE 7.5 MG PO TABS: ORAL_TABLET | ORAL | 1 refills | Status: AC

## 2024-09-28 NOTE — Progress Notes (Signed)
 "  BP 129/68 (BP Location: Left Arm, Patient Position: Sitting, Cuff Size: Normal)   Pulse 60   Temp (!) 97.4 F (36.3 C) (Oral)   Resp 17   Ht 6' 0.01 (1.829 m)   Wt 176 lb 3.2 oz (79.9 kg)   SpO2 95%   BMI 23.89 kg/m    Subjective:    Patient ID: [name], male    DOB: 28-Apr-1951, 74 y.o.   MRN: [id number]  HPI: [name] is a 74 y.o. male  Chief Complaint  Patient presents with   Follow-up    Here for a check up today, needs to get sleep situated, not getting enough sleep.   INSOMNIA Continues to have issues with sleep. 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 Has struggled with this for years.  Tried Melatonin, Ambien , Lunesta, Trazodone , Belsomra , Amitriptyline . Duration: chronic Satisfied with sleep quality: no Difficulty falling asleep: yes Difficulty staying asleep: yes Waking a few hours after sleep onset: yes Early morning awakenings: yes Daytime hypersomnolence: no Wakes feeling refreshed: some mornings Good sleep hygiene: yes Apnea: no Snoring: no Depressed/anxious mood: no Recent stress: no Restless legs/nocturnal leg cramps: no Chronic pain/arthritis: no History of sleep study: yes Treatments attempted: as above    Relevant past medical, surgical, family and social history reviewed and updated as indicated. Interim medical history since our last visit reviewed. Allergies and medications reviewed and updated.  Review of Systems  Constitutional:  Negative for activity change, diaphoresis, fatigue and fever.  Respiratory:  Negative for cough, chest tightness, shortness of breath and wheezing.   Cardiovascular:  Negative for chest pain, palpitations and leg swelling.  Gastrointestinal: Negative.   Endocrine: Negative for cold intolerance, heat intolerance, polydipsia, polyphagia and polyuria.  Neurological: Negative.   Psychiatric/Behavioral:  Positive for sleep disturbance. Negative for decreased concentration, self-injury and suicidal  ideas. The patient is not nervous/anxious.     Per HPI unless specifically indicated above     Objective:    BP 129/68 (BP Location: Left Arm, Patient Position: Sitting, Cuff Size: Normal)   Pulse 60   Temp (!) 97.4 F (36.3 C) (Oral)   Resp 17   Ht 6' 0.01 (1.829 m)   Wt 176 lb 3.2 oz (79.9 kg)   SpO2 95%   BMI 23.89 kg/m   Wt Readings from Last 3 Encounters:  09/28/24 176 lb 3.2 oz (79.9 kg)  08/28/24 177 lb 3.2 oz (80.4 kg)  02/25/24 178 lb 9.6 oz (81 kg)    Physical Exam Vitals and nursing note reviewed.  Constitutional:      General: He is awake. He is not in acute distress.    Appearance: He is well-developed and well-groomed. He is not ill-appearing or toxic-appearing.  HENT:     Head: Normocephalic.     Right Ear: Hearing and external ear normal.     Left Ear: Hearing and external ear normal.  Eyes:     General: Lids are normal.     Extraocular Movements: Extraocular movements intact.     Conjunctiva/sclera: Conjunctivae normal.  Neck:     Thyroid : No thyromegaly.     Vascular: No carotid bruit.  Cardiovascular:     Rate and Rhythm: Normal rate and regular rhythm.     Heart sounds: Normal heart sounds. No murmur heard.    No gallop.  Pulmonary:     Effort: No accessory muscle usage or respiratory distress.     Breath sounds: Normal breath sounds. No decreased  breath sounds, wheezing or rales.  Abdominal:     General: Bowel sounds are normal. There is no distension.     Palpations: Abdomen is soft.     Tenderness: There is no abdominal tenderness.  Musculoskeletal:     Cervical back: Full passive range of motion without pain.     Right lower leg: No edema.     Left lower leg: No edema.  Lymphadenopathy:     Cervical: No cervical adenopathy.  Skin:    General: Skin is warm.     Capillary Refill: Capillary refill takes less than 2 seconds.  Neurological:     Mental Status: He is alert and oriented to person, place, and time.     Deep Tendon Reflexes:  Reflexes are normal and symmetric.     Reflex Scores:      Brachioradialis reflexes are 2+ on the right side and 2+ on the left side.      Patellar reflexes are 2+ on the right side and 2+ on the left side. Psychiatric:        Attention and Perception: Attention normal.        Mood and Affect: Mood normal.        Speech: Speech normal.        Behavior: Behavior normal. Behavior is cooperative.        Thought Content: Thought content normal.     Results for orders placed or performed in visit on 08/28/24  Magnesium   Collection Time: 08/28/24  8:55 AM  Result Value Ref Range   Magnesium 1.8 1.6 - 2.3 mg/dL  Vitamin A87   Collection Time: 08/28/24  8:55 AM  Result Value Ref Range   Vitamin B-12 >2000 (H) 232 - 1245 pg/mL  CBC with Differential/Platelet   Collection Time: 08/28/24  8:55 AM  Result Value Ref Range   WBC 5.5 3.4 - 10.8 x10E3/uL   RBC 5.15 4.14 - 5.80 x10E6/uL   Hemoglobin 15.5 13.0 - 17.7 g/dL   Hematocrit 52.4 62.4 - 51.0 %   MCV 92 79 - 97 fL   MCH 30.1 26.6 - 33.0 pg   MCHC 32.6 31.5 - 35.7 g/dL   RDW 87.0 88.3 - 84.5 %   Platelets 205 150 - 450 x10E3/uL   Neutrophils 52 Not Estab. %   Lymphs 30 Not Estab. %   Monocytes 7 Not Estab. %   Eos 10 Not Estab. %   Basos 1 Not Estab. %   Neutrophils Absolute 2.9 1.4 - 7.0 x10E3/uL   Lymphocytes Absolute 1.7 0.7 - 3.1 x10E3/uL   Monocytes Absolute 0.4 0.1 - 0.9 x10E3/uL   EOS (ABSOLUTE) 0.5 (H) 0.0 - 0.4 x10E3/uL   Basophils Absolute 0.0 0.0 - 0.2 x10E3/uL   Immature Granulocytes 0 Not Estab. %   Immature Grans (Abs) 0.0 0.0 - 0.1 x10E3/uL  Comprehensive metabolic panel with GFR   Collection Time: 08/28/24  8:55 AM  Result Value Ref Range   Glucose 142 (H) 70 - 99 mg/dL   BUN 32 (H) 8 - 27 mg/dL   Creatinine, Ser 8.10 (H) 0.76 - 1.27 mg/dL   eGFR 37 (L) >40 fO/fpw/8.26   BUN/Creatinine Ratio 17 10 - 24   Sodium 141 134 - 144 mmol/L   Potassium 4.7 3.5 - 5.2 mmol/L   Chloride 105 96 - 106 mmol/L   CO2 18  (L) 20 - 29 mmol/L   Calcium  9.5 8.6 - 10.2 mg/dL   Total Protein 6.3 6.0 -  8.5 g/dL   Albumin 4.3 3.8 - 4.8 g/dL   Globulin, Total 2.0 1.5 - 4.5 g/dL   Bilirubin Total 0.3 0.0 - 1.2 mg/dL   Alkaline Phosphatase 54 47 - 123 IU/L   AST 15 0 - 40 IU/L   ALT 14 0 - 44 IU/L      Assessment & Plan:   Problem List Items Addressed This Visit       Other   Insomnia - Primary   Chronic, ongoing. Has history of sleep study years ago with no OSA he reports. Would benefit repeat of this if able to get him to a more sound sleep pattern. Has tried Melatonin, Ambien , Lunesta, Trazodone , Belsomra , Amitriptyline . All with no benefit, even with max doses for his age. Will trial Mirtazapine  starting at 7.5 MG nightly and increase to 15 MG in a few days if needed. Educated him on this medication and side effects. His BMI is underweight, so increased appetite we can monitor. If no benefit could trial Sonata next. Would avoid antipsychotic family due to his heart history and age. If needed as last resort will consider benzo family.        Follow up plan: Return in about 4 weeks (around 10/26/2024) for Insomnia -- Mirtazapine  added on 09/28/24.      "

## 2024-09-28 NOTE — Assessment & Plan Note (Signed)
 Chronic, ongoing. Has history of sleep study years ago with no OSA he reports. Would benefit repeat of this if able to get him to a more sound sleep pattern. Has tried Melatonin, Ambien , Lunesta, Trazodone , Belsomra , Amitriptyline . All with no benefit, even with max doses for his age. Will trial Mirtazapine  starting at 7.5 MG nightly and increase to 15 MG in a few days if needed. Educated him on this medication and side effects. His BMI is underweight, so increased appetite we can monitor. If no benefit could trial Sonata next. Would avoid antipsychotic family due to his heart history and age. If needed as last resort will consider benzo family.

## 2024-10-03 LAB — GENECONNECT MOLECULAR SCREEN: Genetic Analysis Overall Interpretation: NEGATIVE

## 2024-10-26 ENCOUNTER — Ambulatory Visit: Payer: Self-pay

## 2024-10-27 ENCOUNTER — Ambulatory Visit: Admitting: Nurse Practitioner

## 2025-02-27 ENCOUNTER — Ambulatory Visit: Admitting: Nurse Practitioner
